# Patient Record
Sex: Female | Born: 1965
Health system: Southern US, Community
[De-identification: ages and names within clinical notes are randomized; demographics above are authoritative.]

## PROBLEM LIST (undated history)

## (undated) DIAGNOSIS — C801 Malignant (primary) neoplasm, unspecified: Secondary | ICD-10-CM

## (undated) DIAGNOSIS — K5909 Other constipation: Secondary | ICD-10-CM

## (undated) DIAGNOSIS — T7840XA Allergy, unspecified, initial encounter: Secondary | ICD-10-CM

## (undated) DIAGNOSIS — R011 Cardiac murmur, unspecified: Secondary | ICD-10-CM

## (undated) DIAGNOSIS — D649 Anemia, unspecified: Secondary | ICD-10-CM

## (undated) DIAGNOSIS — I10 Essential (primary) hypertension: Secondary | ICD-10-CM

## (undated) DIAGNOSIS — F32A Depression, unspecified: Secondary | ICD-10-CM

## (undated) DIAGNOSIS — K219 Gastro-esophageal reflux disease without esophagitis: Secondary | ICD-10-CM

## (undated) HISTORY — PX: COLONOSCOPY: SHX174

## (undated) HISTORY — PX: LYMPH NODE BIOPSY: SHX201

## (undated) HISTORY — PX: CHOLECYSTECTOMY: SHX55

## (undated) HISTORY — DX: Anemia, unspecified: D64.9

## (undated) HISTORY — DX: Depression, unspecified: F32.A

## (undated) HISTORY — DX: Malignant (primary) neoplasm, unspecified: C80.1

## (undated) HISTORY — DX: Cardiac murmur, unspecified: R01.1

## (undated) HISTORY — DX: Allergy, unspecified, initial encounter: T78.40XA

## (undated) HISTORY — DX: Gastro-esophageal reflux disease without esophagitis: K21.9

## (undated) HISTORY — DX: Essential (primary) hypertension: I10

## (undated) HISTORY — PX: NASAL SINUS SURGERY: SHX719

## (undated) HISTORY — DX: Other constipation: K59.09

---

## 1995-06-06 HISTORY — PX: ECTOPIC PREGNANCY SURGERY: SHX613

## 2004-08-25 ENCOUNTER — Ambulatory Visit: Payer: Self-pay | Admitting: Otolaryngology

## 2004-09-01 ENCOUNTER — Ambulatory Visit: Payer: Self-pay | Admitting: Otolaryngology

## 2005-12-11 ENCOUNTER — Ambulatory Visit: Payer: Self-pay | Admitting: Otolaryngology

## 2005-12-14 ENCOUNTER — Ambulatory Visit: Payer: Self-pay | Admitting: Otolaryngology

## 2008-02-25 ENCOUNTER — Emergency Department: Payer: Self-pay | Admitting: Emergency Medicine

## 2008-02-25 ENCOUNTER — Other Ambulatory Visit: Payer: Self-pay

## 2010-08-16 ENCOUNTER — Ambulatory Visit: Payer: Self-pay | Admitting: Internal Medicine

## 2010-12-30 ENCOUNTER — Ambulatory Visit (INDEPENDENT_AMBULATORY_CARE_PROVIDER_SITE_OTHER): Payer: 59 | Admitting: Cardiovascular Disease

## 2010-12-30 ENCOUNTER — Encounter: Payer: Self-pay | Admitting: Cardiovascular Disease

## 2010-12-30 DIAGNOSIS — R079 Chest pain, unspecified: Secondary | ICD-10-CM

## 2010-12-30 DIAGNOSIS — K219 Gastro-esophageal reflux disease without esophagitis: Secondary | ICD-10-CM

## 2010-12-30 DIAGNOSIS — F419 Anxiety disorder, unspecified: Secondary | ICD-10-CM | POA: Insufficient documentation

## 2010-12-30 DIAGNOSIS — F411 Generalized anxiety disorder: Secondary | ICD-10-CM | POA: Insufficient documentation

## 2010-12-30 HISTORY — DX: Anxiety disorder, unspecified: F41.9

## 2010-12-30 NOTE — Progress Notes (Signed)
Exercise Treadmill Test  Treadmill ordered for recent epsiodes of chest pain.  Resting EKG shows NSR with rate of 64 bpm, no significant ST or T wave changes Resting blood pressure of 118/72 Stand bruce protocal was used.  Patient exercised for 8 min 01 sec,  Peak heart rate of 167 bpm.  This was 97% of the maximum predicted heart rate (target heart rate 147). No symptoms of chest pain or lightheadedness were reported at peak stress or in recovery.  Peak Blood pressure recorded was 180/92 Heart rate at 3 minutes in recovery was 117 bpm.  FINAL IMPRESSION: Normal exercise stress test. No significant EKG changes concerning for ischemia. Excellent exercise tolerance.

## 2010-12-30 NOTE — Assessment & Plan Note (Signed)
Recent episodes of chest pain over the past year. Symptoms are somewhat atypical. No significant family history or EKG changes. She is concerned and we will have her complete a treadmill stress test to reassure her.

## 2010-12-30 NOTE — Assessment & Plan Note (Signed)
She does have significant stress at home. Her symptoms of chest pain or real and my suspicion is that they are not secondary to stress and possibly secondary to GI etiology.

## 2010-12-30 NOTE — Progress Notes (Signed)
Patient ID: Dawn Griffith, female    DOB: April 25, 1966, 45 y.o.   MRN: [id number]  HPI Comments: Dawn Griffith is a very pleasant 45 year old woman, patient of Dr. Darrick Huntsman, with a history of GERD, with recent episodes of chest pain over the past year. She presents for evaluation of these symptoms.   She reports that over the past year, she has had probably up to 20 episodes of sharp pain in the center of her chest, around the xiphoid area upwards. It comes on for one to one and a half hours at a time. He can happen typically at rest while she was watching TV or at work sitting. She has not noted any precipitating factors. No activities or medications seem to alleviate her discomfort. She does report though that when she was on a PPI, her symptoms were better. She took herself off the medication in February of this year as she did not feel was helping.  She had any episode of discomfort this week on Tuesday with sweating, pallor with the pain. It resolved after some time. Again was not associated with exertion. She is typically very active. She has had significant stress at home.  EKG shows normal sinus rhythm with rate 64 beats per minute,No significant ST or T wave changes   Outpatient Encounter Prescriptions as of 12/30/2010  Medication Sig Dispense Refill  . ALPRAZolam (XANAX) 0.5 MG tablet Take 0.5 mg by mouth at bedtime as needed.        Marland Kitchen dexlansoprazole (DEXILANT) 60 MG capsule Take 60 mg by mouth daily.        . furosemide (LASIX) 20 MG tablet Take 20 mg by mouth daily.        . hyoscyamine (LEVBID) 0.375 MG 12 hr tablet Take 0.375 mg by mouth every 12 (twelve) hours as needed.        Marland Kitchen losartan (COZAAR) 100 MG tablet Take 100 mg by mouth daily.        . Norgestrel-Ethinyl Estradiol (LOW-OGESTREL PO) Take by mouth.        . zolpidem (AMBIEN CR) 12.5 MG CR tablet Take 12.5 mg by mouth at bedtime as needed.           Review of Systems  Constitutional: Negative.   HENT: Negative.   Eyes:  Negative.   Respiratory: Negative.   Cardiovascular: Positive for chest pain.  Gastrointestinal: Negative.   Musculoskeletal: Negative.   Skin: Negative.   Neurological: Negative.   Hematological: Negative.   Psychiatric/Behavioral: Negative.   All other systems reviewed and are negative.    BP 135/81  Pulse 63  Ht 5\' 9"  (1.753 m)  Wt 173 lb (78.472 kg)  BMI 25.55 kg/m2  Physical Exam  Nursing note and vitals reviewed. Constitutional: She is oriented to person, place, and time. She appears well-developed and well-nourished.  HENT:  Head: Normocephalic.  Nose: Nose normal.  Mouth/Throat: Oropharynx is clear and moist.  Eyes: Conjunctivae are normal. Pupils are equal, round, and reactive to light.  Neck: Normal range of motion. Neck supple. No JVD present.  Cardiovascular: Normal rate, regular rhythm, S1 normal, S2 normal, normal heart sounds and intact distal pulses.  Exam reveals no gallop and no friction rub.   No murmur heard. Pulmonary/Chest: Effort normal and breath sounds normal. No respiratory distress. She has no wheezes. She has no rales. She exhibits no tenderness.  Abdominal: Soft. Bowel sounds are normal. She exhibits no distension. There is no tenderness.  Musculoskeletal: Normal range  of motion. She exhibits no edema and no tenderness.  Lymphadenopathy:    She has no cervical adenopathy.  Neurological: She is alert and oriented to person, place, and time. Coordination normal.  Skin: Skin is warm and dry. No rash noted. No erythema.  Psychiatric: She has a normal mood and affect. Her behavior is normal. Judgment and thought content normal.         Assessment and Plan

## 2010-12-30 NOTE — Assessment & Plan Note (Signed)
We have suggested that she go back on her proton pump inhibitor. If her symptoms persist, and if her treadmill is normal, she could consider workup with gastroenterology.

## 2010-12-30 NOTE — Patient Instructions (Signed)
You are doing well. No medication changes were made. We will perform a treadmill study today. Please call us if you have new issues that need to be addressed

## 2011-02-13 ENCOUNTER — Other Ambulatory Visit (INDEPENDENT_AMBULATORY_CARE_PROVIDER_SITE_OTHER): Payer: BC Managed Care – PPO | Admitting: *Deleted

## 2011-02-13 ENCOUNTER — Telehealth: Payer: Self-pay | Admitting: *Deleted

## 2011-02-13 DIAGNOSIS — R1013 Epigastric pain: Secondary | ICD-10-CM | POA: Insufficient documentation

## 2011-02-13 DIAGNOSIS — Z0000 Encounter for general adult medical examination without abnormal findings: Secondary | ICD-10-CM

## 2011-02-13 NOTE — Telephone Encounter (Signed)
Patient notified

## 2011-02-13 NOTE — Telephone Encounter (Signed)
Order faxed to [facility] for ultrasound. Also lab appt scheduled for patient to come in for cmet.

## 2011-02-13 NOTE — Telephone Encounter (Signed)
i do not havean appt today but I would like to get her an abdominal ultrasound and a CMET at [facility] today and have her keep appt for tomorro.  This is to rule out gallbladder disease.Dawn Griffith

## 2011-02-13 NOTE — Telephone Encounter (Signed)
Patient says that yesterday she was having pain that in her chest that wrapped all the way around to her back. She says that it lasted a good while and was so painful that she almost went to the ER. She says that she has seen you at [facility] for this same problem several times and has been following all of your instructions. She has an appt scheduled for tomorrow, but is asking if she can be seen today. Please advise.

## 2011-02-14 ENCOUNTER — Ambulatory Visit: Payer: Self-pay | Admitting: Internal Medicine

## 2011-02-14 ENCOUNTER — Encounter: Payer: Self-pay | Admitting: Internal Medicine

## 2011-02-14 ENCOUNTER — Ambulatory Visit (INDEPENDENT_AMBULATORY_CARE_PROVIDER_SITE_OTHER): Payer: BC Managed Care – PPO | Admitting: Internal Medicine

## 2011-02-14 DIAGNOSIS — K8020 Calculus of gallbladder without cholecystitis without obstruction: Secondary | ICD-10-CM

## 2011-02-14 DIAGNOSIS — K8051 Calculus of bile duct without cholangitis or cholecystitis with obstruction: Secondary | ICD-10-CM

## 2011-02-14 LAB — COMPREHENSIVE METABOLIC PANEL
BUN: 7 mg/dL (ref 6–23)
CO2: 22 mEq/L (ref 19–32)
Calcium: 9.2 mg/dL (ref 8.4–10.5)
Chloride: 108 mEq/L (ref 96–112)
Creatinine, Ser: 0.6 mg/dL (ref 0.4–1.2)
GFR: 111.63 mL/min (ref >60.00)
Glucose, Bld: 98 mg/dL (ref 70–99)
Total Bilirubin: 3.9 mg/dL — ABNORMAL HIGH (ref 0.3–1.2)

## 2011-02-14 MED ORDER — OXYCODONE-ACETAMINOPHEN 5-325 MG PO TABS: 1.0000 | ORAL_TABLET | Freq: Four times a day (QID) | ORAL | Status: DC | PRN

## 2011-02-14 MED ORDER — ALPRAZOLAM 0.5 MG PO TABS: 0.5000 mg | ORAL_TABLET | Freq: Every evening | ORAL | Status: DC | PRN

## 2011-02-14 MED ORDER — AMITRIPTYLINE HCL 100 MG PO TABS: 100.0000 mg | ORAL_TABLET | Freq: Every day | ORAL | Status: DC

## 2011-02-14 MED ORDER — ZOLPIDEM TARTRATE ER 12.5 MG PO TBCR: 12.5000 mg | EXTENDED_RELEASE_TABLET | Freq: Every evening | ORAL | Status: DC | PRN

## 2011-02-14 NOTE — Patient Instructions (Signed)
You need to have your gallbladder out.  If you have another attack ,  Go to the [facility] ER.

## 2011-02-15 ENCOUNTER — Telehealth: Payer: Self-pay | Admitting: Internal Medicine

## 2011-02-15 DIAGNOSIS — K8051 Calculus of bile duct without cholangitis or cholecystitis with obstruction: Secondary | ICD-10-CM | POA: Insufficient documentation

## 2011-02-15 NOTE — Telephone Encounter (Signed)
I also told her that I had left 2 messages on Dr. Brett Canales Wilson's machine.  She is the one that gave me the other two doctors numbers to call./slj

## 2011-02-15 NOTE — Progress Notes (Signed)
  Subjective:    Patient ID: Dawn Griffith, female    DOB: [birth year], 45 y.o.   MRN: [id number]  HPI  45 yr old white female with history of generalzied anxiety and GERD presents with hsitory of recurrent substernal chest pain , last episode over the weekend lasting approximately 12 hours.  Occurred after eating,  accompaneid by nausea.  No vomiting, fevers, or change in bowel habits .  Urine has been dark yellow.  Has had prior cardiology evaluation with negative stress test done this summer. Prior to today's visit was sent for abdominal ultrasound and CMET which noted gallstones,  GB wall thickeing, and cholestatic labs with elevated T Bili, alk phos and AST/ALT    Review of Systems  Constitutional: Negative for fever, chills and unexpected weight change.  HENT: Negative for hearing loss, ear pain, nosebleeds, congestion, sore throat, facial swelling, rhinorrhea, sneezing, mouth sores, trouble swallowing, neck pain, neck stiffness, voice change, postnasal drip, sinus pressure, tinnitus and ear discharge.   Eyes: Negative for pain, discharge, redness and visual disturbance.  Respiratory: Negative for cough, chest tightness, shortness of breath, wheezing and stridor.   Cardiovascular: Negative for chest pain, palpitations and leg swelling.  Gastrointestinal: Positive for abdominal pain.  Musculoskeletal: Negative for myalgias and arthralgias.  Skin: Negative for color change and rash.  Neurological: Negative for dizziness, weakness, light-headedness and headaches.  Hematological: Negative for adenopathy.       Objective:   Physical Exam  Constitutional: She is oriented to person, place, and time. She appears well-developed and well-nourished.  HENT:  Mouth/Throat: Oropharynx is clear and moist.  Eyes: EOM are normal. Pupils are equal, round, and reactive to light. No scleral icterus.  Neck: Normal range of motion. Neck supple. No JVD present. No thyromegaly present.  Cardiovascular:  Normal rate, regular rhythm, normal heart sounds and intact distal pulses.   Pulmonary/Chest: Effort normal and breath sounds normal.  Abdominal: Soft. Bowel sounds are normal. She exhibits no mass. There is tenderness. There is no rebound and no guarding.  Musculoskeletal: Normal range of motion. She exhibits no edema.  Lymphadenopathy:    She has no cervical adenopathy.  Neurological: She is alert and oriented to person, place, and time.  Skin: Skin is warm and dry.  Psychiatric: She has a normal mood and affect.          Assessment & Plan:

## 2011-02-15 NOTE — Assessment & Plan Note (Signed)
With jaundice by labs (physical exam relatively normal) and gallstones and GB wall thickening.  She needs urgen asurgical consult but not emergent as she is currently having no pain or fevers.  She is requesting referral to [facility] for surgical consult as her gastroenterologist is there.  We will call his office the morning to arrange.  Instructed to go to [facility] ER if she develops another episode of pain .

## 2011-02-16 NOTE — Telephone Encounter (Signed)
I have spoken to [facility] and also to Dr. Evette Cristal.  His office is going to call her today to get her in ASAP.  Please fax her office ntoe and labs to his office.

## 2011-02-17 ENCOUNTER — Ambulatory Visit: Payer: 59 | Admitting: Internal Medicine

## 2011-02-17 ENCOUNTER — Other Ambulatory Visit: Payer: Self-pay | Admitting: General Surgery

## 2011-02-21 ENCOUNTER — Ambulatory Visit: Payer: Self-pay | Admitting: General Surgery

## 2011-02-23 LAB — PATHOLOGY REPORT

## 2011-02-28 ENCOUNTER — Telehealth: Payer: Self-pay | Admitting: Internal Medicine

## 2011-02-28 NOTE — Telephone Encounter (Signed)
She was very thankful and very pleased with the doctor we recommended to her in [place].  She did say that she had the surgery on the 18th of Sept. And she goes back and sees him on Sept. 28th.

## 2011-03-09 ENCOUNTER — Encounter: Payer: Self-pay | Admitting: Internal Medicine

## 2011-03-10 ENCOUNTER — Ambulatory Visit (INDEPENDENT_AMBULATORY_CARE_PROVIDER_SITE_OTHER): Payer: BC Managed Care – PPO | Admitting: Internal Medicine

## 2011-03-10 DIAGNOSIS — Z23 Encounter for immunization: Secondary | ICD-10-CM

## 2011-03-31 ENCOUNTER — Encounter: Payer: Self-pay | Admitting: Internal Medicine

## 2011-03-31 ENCOUNTER — Ambulatory Visit (INDEPENDENT_AMBULATORY_CARE_PROVIDER_SITE_OTHER): Payer: BC Managed Care – PPO | Admitting: Internal Medicine

## 2011-03-31 DIAGNOSIS — Z79899 Other long term (current) drug therapy: Secondary | ICD-10-CM

## 2011-03-31 DIAGNOSIS — F419 Anxiety disorder, unspecified: Secondary | ICD-10-CM

## 2011-03-31 DIAGNOSIS — Z13220 Encounter for screening for lipoid disorders: Secondary | ICD-10-CM

## 2011-03-31 DIAGNOSIS — F411 Generalized anxiety disorder: Secondary | ICD-10-CM

## 2011-03-31 DIAGNOSIS — K8051 Calculus of bile duct without cholangitis or cholecystitis with obstruction: Secondary | ICD-10-CM

## 2011-03-31 DIAGNOSIS — K219 Gastro-esophageal reflux disease without esophagitis: Secondary | ICD-10-CM

## 2011-03-31 NOTE — Progress Notes (Signed)
  Subjective:    Patient ID: Dawn Griffith, female    DOB: [birth year], 45 y.o.   MRN: [id number]  HPI  45 yo white female with history of generalized anxiety/depressive disorder recently diagnosed by choledochlithiasis returns for hospital followup afterundergong successful  lap chole with stone removal by Evette Cristal on Sept 18th.   She feels  better than she has in a long time.  No abdominal pain or diarrhea.  She is sleeping much better with pm use of amitriptyline.  Past Medical History  Diagnosis Date  . Cancer     as a child  . Hypertension    Current Outpatient Prescriptions on File Prior to Visit  Medication Sig Dispense Refill  . ALPRAZolam (XANAX) 0.5 MG tablet Take 1 tablet (0.5 mg total) by mouth at bedtime as needed.  30 tablet  3  . amitriptyline (ELAVIL) 100 MG tablet Take 1 tablet (100 mg total) by mouth at bedtime.  30 tablet  5  . cetirizine (ZYRTEC) 10 MG tablet Take 10 mg by mouth daily.        Marland Kitchen losartan (COZAAR) 100 MG tablet Take 100 mg by mouth daily.        . norgestrel-ethinyl estradiol (LO/OVRAL,CRYSELLE) 0.3-30 MG-MCG tablet Take 1 tablet by mouth daily.        Marland Kitchen omeprazole (PRILOSEC) 40 MG capsule Take 40 mg by mouth daily.        Marland Kitchen zolpidem (AMBIEN CR) 12.5 MG CR tablet Take 1 tablet (12.5 mg total) by mouth at bedtime as needed.  30 tablet  3    Review of Systems  Constitutional: Negative for fever, chills and unexpected weight change.  HENT: Negative for hearing loss, ear pain, nosebleeds, congestion, sore throat, facial swelling, rhinorrhea, sneezing, mouth sores, trouble swallowing, neck pain, neck stiffness, voice change, postnasal drip, sinus pressure, tinnitus and ear discharge.   Eyes: Negative for pain, discharge, redness and visual disturbance.  Respiratory: Negative for cough, chest tightness, shortness of breath, wheezing and stridor.   Cardiovascular: Negative for chest pain, palpitations and leg swelling.  Musculoskeletal: Negative for myalgias and  arthralgias.  Skin: Negative for color change and rash.  Neurological: Negative for dizziness, weakness, light-headedness and headaches.  Hematological: Negative for adenopathy.       Objective:   Physical Exam  Constitutional: She is oriented to person, place, and time. She appears well-developed and well-nourished.  HENT:  Mouth/Throat: Oropharynx is clear and moist.  Eyes: EOM are normal. Pupils are equal, round, and reactive to light. No scleral icterus.  Neck: Normal range of motion. Neck supple. No JVD present. No thyromegaly present.  Cardiovascular: Normal rate, regular rhythm, normal heart sounds and intact distal pulses.   Pulmonary/Chest: Effort normal and breath sounds normal.  Abdominal: Soft. Bowel sounds are normal. She exhibits no mass. There is no tenderness.  Musculoskeletal: Normal range of motion. She exhibits no edema.  Lymphadenopathy:    She has no cervical adenopathy.  Neurological: She is alert and oriented to person, place, and time.  Skin: Skin is warm and dry.  Psychiatric: She has a normal mood and affect.          Assessment & Plan:

## 2011-04-02 ENCOUNTER — Encounter: Payer: Self-pay | Admitting: Internal Medicine

## 2011-04-02 NOTE — Assessment & Plan Note (Signed)
Controlled with current medications.  No changes today

## 2011-04-02 NOTE — Assessment & Plan Note (Signed)
Controlled with PPI

## 2011-04-02 NOTE — Assessment & Plan Note (Signed)
S/p lap chole with stone retrieval by Dr. Evette Cristal.  No complications.

## 2011-04-05 ENCOUNTER — Other Ambulatory Visit: Payer: BC Managed Care – PPO

## 2011-04-06 ENCOUNTER — Other Ambulatory Visit (INDEPENDENT_AMBULATORY_CARE_PROVIDER_SITE_OTHER): Payer: BC Managed Care – PPO | Admitting: *Deleted

## 2011-04-06 DIAGNOSIS — Z79899 Other long term (current) drug therapy: Secondary | ICD-10-CM

## 2011-04-06 DIAGNOSIS — R1013 Epigastric pain: Secondary | ICD-10-CM

## 2011-04-06 DIAGNOSIS — Z13220 Encounter for screening for lipoid disorders: Secondary | ICD-10-CM

## 2011-04-06 LAB — COMPREHENSIVE METABOLIC PANEL
ALT: 37 U/L — ABNORMAL HIGH (ref 0–35)
Alkaline Phosphatase: 49 U/L (ref 39–117)
CO2: 23 mEq/L (ref 19–32)
GFR: 101.87 mL/min (ref >60.00)
Potassium: 4.2 mEq/L (ref 3.5–5.1)
Sodium: 140 mEq/L (ref 135–145)
Total Bilirubin: 0.6 mg/dL (ref 0.3–1.2)
Total Protein: 7.1 g/dL (ref 6.0–8.3)

## 2011-04-06 LAB — LIPID PANEL
Total CHOL/HDL Ratio: 5
Triglycerides: 88 mg/dL (ref 0.0–149.0)

## 2011-04-07 ENCOUNTER — Encounter: Payer: Self-pay | Admitting: Internal Medicine

## 2011-04-08 ENCOUNTER — Other Ambulatory Visit: Payer: Self-pay | Admitting: Internal Medicine

## 2011-05-04 ENCOUNTER — Other Ambulatory Visit: Payer: Self-pay | Admitting: Internal Medicine

## 2011-05-04 MED ORDER — OMEPRAZOLE 40 MG PO CPDR: 40.0000 mg | DELAYED_RELEASE_CAPSULE | Freq: Every day | ORAL | Status: DC

## 2011-05-05 ENCOUNTER — Ambulatory Visit (INDEPENDENT_AMBULATORY_CARE_PROVIDER_SITE_OTHER): Payer: BC Managed Care – PPO | Admitting: Internal Medicine

## 2011-05-05 ENCOUNTER — Encounter: Payer: Self-pay | Admitting: Internal Medicine

## 2011-05-05 DIAGNOSIS — K5900 Constipation, unspecified: Secondary | ICD-10-CM

## 2011-05-05 LAB — COMPREHENSIVE METABOLIC PANEL
Albumin: 4.3 g/dL (ref 3.5–5.2)
BUN: 11 mg/dL (ref 6–23)
Calcium: 9.3 mg/dL (ref 8.4–10.5)
Chloride: 105 mEq/L (ref 96–112)
GFR: 71.19 mL/min (ref >60.00)
Glucose, Bld: 96 mg/dL (ref 70–99)
Potassium: 4.4 mEq/L (ref 3.5–5.1)

## 2011-05-05 LAB — MAGNESIUM: Magnesium: 2 mg/dL (ref 1.5–2.5)

## 2011-05-05 NOTE — Progress Notes (Signed)
  Subjective:    Patient ID: Dawn Griffith, female    DOB: 02/17/1966, 45 y.o.   MRN: [id number]  HPI  45 you white feamle with histoyr of depression and recnet cholecystitis/choledocholithiasis s/p laparascopic cholecystectomy with successful stone extraction presents with new onset constipation which has been occurring for the past  6 weeks, since her gallbladder surgery.  She drinks an adequate amout of water and has been taking a fiber supplement daily for the past two weeks with only 3 stools during that time.  She was using miralwx more successfully but it required daily use.  She denies use of any narcotics or medication changes. No abdominal pian, hemorrhoids, or rectal pain/bleeding.   Past Medical History  Diagnosis Date  . Cancer     as a child  . Hypertension    Current Outpatient Prescriptions on File Prior to Visit  Medication Sig Dispense Refill  . ALPRAZolam (XANAX) 0.5 MG tablet Take 1 tablet (0.5 mg total) by mouth at bedtime as needed.  30 tablet  3  . amitriptyline (ELAVIL) 100 MG tablet Take 1 tablet (100 mg total) by mouth at bedtime.  30 tablet  5  . cetirizine (ZYRTEC) 10 MG tablet Take 10 mg by mouth daily.        Marland Kitchen losartan (COZAAR) 100 MG tablet TAKE 1 TABLET BY MOUTH EVERY DAY  30 tablet  3  . norgestrel-ethinyl estradiol (LO/OVRAL,CRYSELLE) 0.3-30 MG-MCG tablet Take 1 tablet by mouth daily.        Marland Kitchen omeprazole (PRILOSEC) 40 MG capsule Take 1 capsule (40 mg total) by mouth daily.  30 capsule  5  . zolpidem (AMBIEN CR) 12.5 MG CR tablet Take 1 tablet (12.5 mg total) by mouth at bedtime as needed.  30 tablet  3     Review of Systems  Constitutional: Negative for fever, chills and unexpected weight change.  HENT: Negative for hearing loss, ear pain, nosebleeds, congestion, sore throat, facial swelling, rhinorrhea, sneezing, mouth sores, trouble swallowing, neck pain, neck stiffness, voice change, postnasal drip, sinus pressure, tinnitus and ear discharge.   Eyes:  Negative for pain, discharge, redness and visual disturbance.  Respiratory: Negative for cough, chest tightness, shortness of breath, wheezing and stridor.   Cardiovascular: Negative for chest pain, palpitations and leg swelling.  Gastrointestinal: Positive for constipation.  Musculoskeletal: Negative for myalgias and arthralgias.  Skin: Negative for color change and rash.  Neurological: Negative for dizziness, weakness, light-headedness and headaches.  Hematological: Negative for adenopathy.       Objective:   Physical Exam  Constitutional: She is oriented to person, place, and time. She appears well-developed and well-nourished.  HENT:  Mouth/Throat: Oropharynx is clear and moist.  Eyes: EOM are normal. Pupils are equal, round, and reactive to light. No scleral icterus.  Neck: Normal range of motion. Neck supple. No JVD present. No thyromegaly present.  Cardiovascular: Normal rate, regular rhythm, normal heart sounds and intact distal pulses.   Pulmonary/Chest: Effort normal and breath sounds normal.  Abdominal: Soft. Bowel sounds are normal. She exhibits no mass. There is no tenderness.  Musculoskeletal: Normal range of motion. She exhibits no edema.  Lymphadenopathy:    She has no cervical adenopathy.  Neurological: She is alert and oriented to person, place, and time.  Skin: Skin is warm and dry.  Psychiatric: She has a normal mood and affect.          Assessment & Plan:

## 2011-05-06 HISTORY — PX: CHOLECYSTECTOMY: SHX55

## 2011-05-06 LAB — HM COLONOSCOPY: HM Colonoscopy: NORMAL

## 2011-05-07 DIAGNOSIS — K5900 Constipation, unspecified: Secondary | ICD-10-CM | POA: Insufficient documentation

## 2011-05-07 DIAGNOSIS — K5909 Other constipation: Secondary | ICD-10-CM | POA: Insufficient documentation

## 2011-05-07 MED ORDER — LUBIPROSTONE 24 MCG PO CAPS: 24.0000 ug | ORAL_CAPSULE | Freq: Two times a day (BID) | ORAL | Status: DC

## 2011-05-07 MED ORDER — LACTULOSE 20 GM/30ML PO SOLN: 30.0000 mL | ORAL | Status: DC | PRN

## 2011-05-07 NOTE — Patient Instructions (Signed)
Please use miralax on  A daily basis to prevent constipation  We are checking your electrolytes and thyroid function  If the miralax is not helpful, you may begin the amitiza one tablet twice daily.

## 2011-05-07 NOTE — Assessment & Plan Note (Signed)
New onset, since her cholecystectomy.  Will check thyroid functin and electrolytes and recommend daily use of miralax.  Samples of amitiza 24 mcg given

## 2011-05-08 ENCOUNTER — Telehealth: Payer: Self-pay | Admitting: Internal Medicine

## 2011-05-08 NOTE — Telephone Encounter (Signed)
Pt called   [facility] [address] [phone] Pt was here on Friday and dr Darrick Huntsman was going to call rx in for her.  Pt stated she has [facility] and they do not have the rx.  This is a new rx pt doesn't know name of med. Please resend rx

## 2011-05-08 NOTE — Telephone Encounter (Signed)
Rx has been called in.  Patient notified. 

## 2011-05-18 ENCOUNTER — Encounter: Payer: Self-pay | Admitting: Internal Medicine

## 2011-05-18 ENCOUNTER — Ambulatory Visit (INDEPENDENT_AMBULATORY_CARE_PROVIDER_SITE_OTHER): Payer: BC Managed Care – PPO | Admitting: Internal Medicine

## 2011-05-18 DIAGNOSIS — K5900 Constipation, unspecified: Secondary | ICD-10-CM

## 2011-05-18 MED ORDER — LACTULOSE 20 GM/30ML PO SOLN: 30.0000 mL | ORAL | Status: DC | PRN

## 2011-05-18 MED ORDER — LUBIPROSTONE 24 MCG PO CAPS: 24.0000 ug | ORAL_CAPSULE | Freq: Two times a day (BID) | ORAL | Status: DC

## 2011-05-18 NOTE — Patient Instructions (Signed)
Increase the amitiza to two times daily.  Continue daily miralax, save the lactulose  for every 3 days if no BM.

## 2011-05-18 NOTE — Assessment & Plan Note (Addendum)
She has had no improvement with use of daily fiber supplements.  Her mother had a p[resumed diagnosis of FAP.  Patient's last colonoscopy was normal and over  4 years ago.  Since this represents a change in bowel habits I have recommended colonoscopy. Will send back to Dr June Leap for colonoscopy and in the interim increase amitiza to 24 mcg bid.  Samples given

## 2011-05-19 ENCOUNTER — Encounter: Payer: Self-pay | Admitting: Internal Medicine

## 2011-05-19 NOTE — Progress Notes (Signed)
Subjective:    Patient ID: Dawn Griffith, female    DOB: 1966/03/15, 45 y.o.   MRN: [id number]  HPI  Dawn Griffith retursn for two week followup on neew complaint of constipation.. She had cholecystectomy about 8 weeks ago, uncomplicated.  For the past 8 week she  has noted decreased stooling despite adequate diet, water intake, and prn use of fiber laxatives.  Has not used a narcotic since dc from hospital.   At initial visit electrolytes and thyroid function were normal, and she was prescribed lactulose for relief of constipation,  Daily fiberr supplement and trial of amitiza which she has only been using once daily.     Review of Systems  Constitutional: Negative for fever, chills and unexpected weight change.  HENT: Negative for hearing loss, ear pain, nosebleeds, congestion, sore throat, facial swelling, rhinorrhea, sneezing, mouth sores, trouble swallowing, neck pain, neck stiffness, voice change, postnasal drip, sinus pressure, tinnitus and ear discharge.   Eyes: Negative for pain, discharge, redness and visual disturbance.  Respiratory: Negative for cough, chest tightness, shortness of breath, wheezing and stridor.   Cardiovascular: Negative for chest pain, palpitations and leg swelling.  Gastrointestinal: Positive for constipation.  Musculoskeletal: Negative for myalgias and arthralgias.  Skin: Negative for color change and rash.  Neurological: Negative for dizziness, weakness, light-headedness and headaches.  Hematological: Negative for adenopathy.   BP 126/80  Pulse 97  Temp(Src) 98.2 F (36.8 C) (Oral)  Resp 16  Ht 5\' 9"  (1.753 m)  Wt 174 lb 12 oz (79.266 kg)  BMI 25.81 kg/m2  SpO2 100%  LMP 05/10/2011     Objective:   Physical Exam  Constitutional: She is oriented to person, place, and time. She appears well-developed and well-nourished.  HENT:  Mouth/Throat: Oropharynx is clear and moist.  Eyes: EOM are normal. Pupils are equal, round, and reactive to light. No scleral  icterus.  Neck: Normal range of motion. Neck supple. No JVD present. No thyromegaly present.  Cardiovascular: Normal rate, regular rhythm, normal heart sounds and intact distal pulses.   Pulmonary/Chest: Effort normal and breath sounds normal.  Abdominal: Soft. Bowel sounds are normal. She exhibits no mass. There is no tenderness.  Musculoskeletal: Normal range of motion. She exhibits no edema.  Lymphadenopathy:    She has no cervical adenopathy.  Neurological: She is alert and oriented to person, place, and time.  Skin: Skin is warm and dry.  Psychiatric: She has a normal mood and affect.        Assessment & Plan:     Constipation She has had no improvement with use of daily fiber supplements.  Her mother had a p[resumed diagnosis of FAP.  Patient's last colonoscopy was normal and over  4 years ago.  Since this represents a change in bowel habits I have recommended colonoscopy. Will send back to Dr June Leap for colonoscopy and in the interim increase amitiza to 24 mcg bid.  Samples given     Updated Medication List Outpatient Encounter Prescriptions as of 05/18/2011  Medication Sig Dispense Refill  . ALPRAZolam (XANAX) 0.5 MG tablet Take 1 tablet (0.5 mg total) by mouth at bedtime as needed.  30 tablet  3  . amitriptyline (ELAVIL) 100 MG tablet Take 1 tablet (100 mg total) by mouth at bedtime.  30 tablet  5  . cetirizine (ZYRTEC) 10 MG tablet Take 10 mg by mouth daily.        . Lactulose 20 GM/30ML SOLN Take 30 mLs (20 g total)  by mouth every 4 (four) hours as needed (constipation).  240 mL  1  . losartan (COZAAR) 100 MG tablet TAKE 1 TABLET BY MOUTH EVERY DAY  30 tablet  3  . lubiprostone (AMITIZA) 24 MCG capsule Take 1 capsule (24 mcg total) by mouth 2 (two) times daily with a meal.  60 capsule  0  . norgestrel-ethinyl estradiol (LO/OVRAL,CRYSELLE) 0.3-30 MG-MCG tablet Take 1 tablet by mouth daily.        Marland Kitchen omeprazole (PRILOSEC) 40 MG capsule Take 1 capsule (40 mg total) by  mouth daily.  30 capsule  5  . zolpidem (AMBIEN CR) 12.5 MG CR tablet Take 1 tablet (12.5 mg total) by mouth at bedtime as needed.  30 tablet  3  . DISCONTD: Lactulose 20 GM/30ML SOLN Take 30 mLs (20 g total) by mouth every 4 (four) hours as needed (constipation).  240 mL  1  . DISCONTD: lubiprostone (AMITIZA) 24 MCG capsule Take 1 capsule (24 mcg total) by mouth 2 (two) times daily with a meal.  14 capsule  0

## 2011-06-08 ENCOUNTER — Other Ambulatory Visit (INDEPENDENT_AMBULATORY_CARE_PROVIDER_SITE_OTHER): Payer: BC Managed Care – PPO | Admitting: *Deleted

## 2011-06-08 ENCOUNTER — Other Ambulatory Visit: Payer: Self-pay | Admitting: Internal Medicine

## 2011-06-08 ENCOUNTER — Telehealth: Payer: Self-pay | Admitting: *Deleted

## 2011-06-08 DIAGNOSIS — R7989 Other specified abnormal findings of blood chemistry: Secondary | ICD-10-CM

## 2011-06-08 LAB — HEPATIC FUNCTION PANEL
ALT: 40 U/L — ABNORMAL HIGH (ref 0–35)
AST: 24 U/L (ref 0–37)
Albumin: 3.7 g/dL (ref 3.5–5.2)
Alkaline Phosphatase: 55 U/L (ref 39–117)

## 2011-06-08 MED ORDER — LUBIPROSTONE 24 MCG PO CAPS: 24.0000 ug | ORAL_CAPSULE | Freq: Two times a day (BID) | ORAL | Status: DC

## 2011-06-08 NOTE — Telephone Encounter (Signed)
Yes, I sent it today

## 2011-06-08 NOTE — Telephone Encounter (Signed)
Patient wanted to let you know that the Dawn Griffith has been helping. She has been taking a probiotic and eating a lot of fiber along with it and since starting this she has had a bowel movement everyday. She is asking if she can get a rx for the amitiza sent to her pharmacy .

## 2011-07-10 ENCOUNTER — Other Ambulatory Visit: Payer: Self-pay | Admitting: *Deleted

## 2011-07-10 MED ORDER — LACTULOSE 20 GM/30ML PO SOLN: 30.0000 mL | ORAL | Status: DC | PRN

## 2011-07-10 NOTE — Telephone Encounter (Signed)
Faxed request from [facility] [address], last filled date not given. 

## 2011-07-11 MED ORDER — ZOLPIDEM TARTRATE ER 12.5 MG PO TBCR: 12.5000 mg | EXTENDED_RELEASE_TABLET | Freq: Every evening | ORAL | Status: DC | PRN

## 2011-07-11 NOTE — Telephone Encounter (Signed)
ambien called to pharmacy

## 2011-07-31 ENCOUNTER — Other Ambulatory Visit: Payer: Self-pay | Admitting: *Deleted

## 2011-07-31 ENCOUNTER — Other Ambulatory Visit: Payer: Self-pay | Admitting: Internal Medicine

## 2011-07-31 MED ORDER — AMITRIPTYLINE HCL 100 MG PO TABS: 100.0000 mg | ORAL_TABLET | Freq: Every day | ORAL | Status: DC

## 2011-07-31 MED ORDER — LOSARTAN POTASSIUM 100 MG PO TABS: 100.0000 mg | ORAL_TABLET | Freq: Every day | ORAL | Status: DC

## 2011-08-15 ENCOUNTER — Ambulatory Visit: Payer: BC Managed Care – PPO | Admitting: Internal Medicine

## 2011-08-15 ENCOUNTER — Encounter: Payer: Self-pay | Admitting: Internal Medicine

## 2011-08-15 ENCOUNTER — Ambulatory Visit (INDEPENDENT_AMBULATORY_CARE_PROVIDER_SITE_OTHER): Payer: BC Managed Care – PPO | Admitting: Internal Medicine

## 2011-08-15 VITALS — BP 134/88 | HR 89 | Temp 98.9°F | Resp 18 | Wt 192.2 lb

## 2011-08-15 DIAGNOSIS — R000 Tachycardia, unspecified: Secondary | ICD-10-CM

## 2011-08-15 DIAGNOSIS — F411 Generalized anxiety disorder: Secondary | ICD-10-CM

## 2011-08-15 DIAGNOSIS — F419 Anxiety disorder, unspecified: Secondary | ICD-10-CM

## 2011-08-15 DIAGNOSIS — E010 Iodine-deficiency related diffuse (endemic) goiter: Secondary | ICD-10-CM

## 2011-08-15 DIAGNOSIS — R002 Palpitations: Secondary | ICD-10-CM

## 2011-08-15 DIAGNOSIS — K5900 Constipation, unspecified: Secondary | ICD-10-CM

## 2011-08-15 DIAGNOSIS — E049 Nontoxic goiter, unspecified: Secondary | ICD-10-CM

## 2011-08-15 MED ORDER — ALPRAZOLAM 0.5 MG PO TABS: 1.0000 mg | ORAL_TABLET | Freq: Two times a day (BID) | ORAL | Status: DC | PRN

## 2011-08-15 MED ORDER — PROPRANOLOL HCL 20 MG PO TABS: 20.0000 mg | ORAL_TABLET | ORAL | Status: AC | PRN

## 2011-08-15 NOTE — Assessment & Plan Note (Signed)
She is having increased insomnia secondary to husband's diagnosis and is requesting increased dose of alprazolam which she has used before.

## 2011-08-15 NOTE — Assessment & Plan Note (Signed)
Improved with use of Amitiza and [facility].

## 2011-08-15 NOTE — Assessment & Plan Note (Signed)
Resolved spontaneously,  In the setting of caffeine excess.  Trial of propranolol if she has another episode.

## 2011-08-15 NOTE — Patient Instructions (Signed)
We are ultrasounding your thyroid    I am prescribing propranolol to take for future episodes of palpitations

## 2011-08-15 NOTE — Progress Notes (Signed)
Subjective:    Patient ID: Dawn Griffith, female    DOB: 05/23/1966, 46 y.o.   MRN: [id number]  HPI presents with history of a 2 hr episode of tachycardia which occurred 4 days ago while using the Internet to check out cruises.  Occurred in the setting of lack of sleep and overeating  On a coca cola cake (very high chocolate content)  .   No history of panic attacks or feelings of doom.  Does note increased stressor at home  since husband has been diagnosed with prostate CA and has been drinking beer to excess.   Past Medical History  Diagnosis Date  . Cancer     as a child  . Hypertension    Current Outpatient Prescriptions on File Prior to Visit  Medication Sig Dispense Refill  . amitriptyline (ELAVIL) 100 MG tablet Take 1 tablet (100 mg total) by mouth at bedtime.  30 tablet  5  . cetirizine (ZYRTEC) 10 MG tablet Take 10 mg by mouth daily.        . Lactulose 20 GM/30ML SOLN Take 30 mLs (20 g total) by mouth every 4 (four) hours as needed (constipation).  240 mL  1  . losartan (COZAAR) 100 MG tablet Take 1 tablet (100 mg total) by mouth daily.  30 tablet  3  . norgestrel-ethinyl estradiol (LO/OVRAL,CRYSELLE) 0.3-30 MG-MCG tablet Take 1 tablet by mouth daily.        Marland Kitchen omeprazole (PRILOSEC) 40 MG capsule Take 1 capsule (40 mg total) by mouth daily.  30 capsule  5  . zolpidem (AMBIEN CR) 12.5 MG CR tablet Take 1 tablet (12.5 mg total) by mouth at bedtime as needed.  30 tablet  4    Review of Systems  Constitutional: Negative for fever, chills and unexpected weight change.  HENT: Negative for hearing loss, ear pain, nosebleeds, congestion, sore throat, facial swelling, rhinorrhea, sneezing, mouth sores, trouble swallowing, neck pain, neck stiffness, voice change, postnasal drip, sinus pressure, tinnitus and ear discharge.   Eyes: Negative for pain, discharge, redness and visual disturbance.  Respiratory: Negative for cough, chest tightness, shortness of breath, wheezing and stridor.     Cardiovascular: Negative for chest pain, palpitations and leg swelling.  Musculoskeletal: Negative for myalgias and arthralgias.  Skin: Negative for color change and rash.  Neurological: Negative for dizziness, weakness, light-headedness and headaches.  Hematological: Negative for adenopathy.   BP 134/88  Pulse 89  Temp(Src) 98.9 F (37.2 C) (Oral)  Resp 18  Wt 192 lb 4 oz (87.204 kg)  SpO2 100%  LMP 07/31/2011     Objective:   Physical Exam  Constitutional: She is oriented to person, place, and time. She appears well-developed and well-nourished.  HENT:  Mouth/Throat: Oropharynx is clear and moist.  Eyes: EOM are normal. Pupils are equal, round, and reactive to light. No scleral icterus.  Neck: Normal range of motion. Neck supple. No JVD present. No thyromegaly present.  Cardiovascular: Normal rate, regular rhythm, normal heart sounds and intact distal pulses.   Pulmonary/Chest: Effort normal and breath sounds normal.  Abdominal: Soft. Bowel sounds are normal. She exhibits no mass. There is no tenderness.  Musculoskeletal: Normal range of motion. She exhibits no edema.  Lymphadenopathy:    She has no cervical adenopathy.  Neurological: She is alert and oriented to person, place, and time.  Skin: Skin is warm and dry.  Psychiatric: She has a normal mood and affect.      Assessment & Plan:  Anxiety She is having increased insomnia secondary to husband's diagnosis and is requesting increased dose of alprazolam which she has used before.   Constipation Improved with use of Amitiza and [facility].   Palpitations Resolved spontaneously,  In the setting of caffeine excess.  Trial of propranolol if she has another episode.     Updated Medication List Outpatient Encounter Prescriptions as of 08/15/2011  Medication Sig Dispense Refill  . ALPRAZolam (XANAX) 0.5 MG tablet Take 2 tablets (1 mg total) by mouth 2 (two) times daily as needed for sleep or anxiety.  60  tablet  3  . amitriptyline (ELAVIL) 100 MG tablet Take 1 tablet (100 mg total) by mouth at bedtime.  30 tablet  5  . cetirizine (ZYRTEC) 10 MG tablet Take 10 mg by mouth daily.        . Lactulose 20 GM/30ML SOLN Take 30 mLs (20 g total) by mouth every 4 (four) hours as needed (constipation).  240 mL  1  . losartan (COZAAR) 100 MG tablet Take 1 tablet (100 mg total) by mouth daily.  30 tablet  3  . norgestrel-ethinyl estradiol (LO/OVRAL,CRYSELLE) 0.3-30 MG-MCG tablet Take 1 tablet by mouth daily.        Marland Kitchen omeprazole (PRILOSEC) 40 MG capsule Take 1 capsule (40 mg total) by mouth daily.  30 capsule  5  . zolpidem (AMBIEN CR) 12.5 MG CR tablet Take 1 tablet (12.5 mg total) by mouth at bedtime as needed.  30 tablet  4  . DISCONTD: ALPRAZolam (XANAX) 0.5 MG tablet Take 1 tablet (0.5 mg total) by mouth at bedtime as needed.  30 tablet  3  . propranolol (INDERAL) 20 MG tablet Take 1 tablet (20 mg total) by mouth as needed. For rapid heart rate  30 tablet  0

## 2011-08-21 ENCOUNTER — Ambulatory Visit: Payer: Self-pay | Admitting: Internal Medicine

## 2011-08-24 ENCOUNTER — Telehealth: Payer: Self-pay | Admitting: Internal Medicine

## 2011-08-24 NOTE — Telephone Encounter (Signed)
Her thyroid ultrasound showed that although she had no nodules or cysts,  The thyroid was borderline enlarged.  Since her thyroid function nis normal,.  I suggest we repeat the u/s in a a year and repeat thyroid function in 6 months.

## 2011-08-24 NOTE — Telephone Encounter (Signed)
Her thyroid ultrasound did not show any nodules or masses, but the thyroid was borderline enlarged.

## 2011-08-25 NOTE — Telephone Encounter (Signed)
Patient notified of results.

## 2011-09-06 ENCOUNTER — Other Ambulatory Visit (INDEPENDENT_AMBULATORY_CARE_PROVIDER_SITE_OTHER): Payer: BC Managed Care – PPO | Admitting: *Deleted

## 2011-09-06 ENCOUNTER — Encounter: Payer: Self-pay | Admitting: Internal Medicine

## 2011-09-06 DIAGNOSIS — Z79899 Other long term (current) drug therapy: Secondary | ICD-10-CM

## 2011-09-06 LAB — HEPATIC FUNCTION PANEL
ALT: 41 U/L — ABNORMAL HIGH (ref 0–35)
Alkaline Phosphatase: 51 U/L (ref 39–117)
Bilirubin, Direct: 0 mg/dL (ref 0.0–0.3)
Total Bilirubin: 0.4 mg/dL (ref 0.3–1.2)
Total Protein: 7.1 g/dL (ref 6.0–8.3)

## 2011-09-12 ENCOUNTER — Telehealth: Payer: Self-pay | Admitting: Internal Medicine

## 2011-09-12 NOTE — Telephone Encounter (Signed)
I never saw htem.  The chart says ashley reviewed them on April 3rd, i don't understand what is happening.  They are unchanged from 4 months ago.  Which means that one liver enzyme is still a tiny bit abnormal.  It is nothing to worry about.  She may have a condition called fatty liver, which improves with weight loss and good diet (low carbohydrate diet).  Repeat in 3 months

## 2011-09-12 NOTE — Telephone Encounter (Signed)
Patient wanting her liver enzyme results.

## 2011-09-13 NOTE — Telephone Encounter (Signed)
Patient notified of results.

## 2011-09-29 ENCOUNTER — Ambulatory Visit: Payer: BC Managed Care – PPO | Admitting: Internal Medicine

## 2011-10-09 ENCOUNTER — Other Ambulatory Visit: Payer: Self-pay | Admitting: Internal Medicine

## 2011-10-24 ENCOUNTER — Other Ambulatory Visit: Payer: Self-pay | Admitting: Internal Medicine

## 2011-10-24 MED ORDER — OMEPRAZOLE 40 MG PO CPDR: 40.0000 mg | DELAYED_RELEASE_CAPSULE | Freq: Every day | ORAL | Status: DC

## 2011-10-31 ENCOUNTER — Telehealth: Payer: Self-pay | Admitting: Internal Medicine

## 2011-10-31 DIAGNOSIS — R5383 Other fatigue: Secondary | ICD-10-CM

## 2011-10-31 DIAGNOSIS — E785 Hyperlipidemia, unspecified: Secondary | ICD-10-CM

## 2011-10-31 NOTE — Telephone Encounter (Signed)
Patient needing CPE labs put in her labs on scheduled for 5.29.13 and her physical is on Friday 5.30.13.

## 2011-10-31 NOTE — Telephone Encounter (Signed)
Entered in epic

## 2011-11-01 ENCOUNTER — Other Ambulatory Visit (INDEPENDENT_AMBULATORY_CARE_PROVIDER_SITE_OTHER): Payer: BC Managed Care – PPO | Admitting: *Deleted

## 2011-11-01 DIAGNOSIS — K5900 Constipation, unspecified: Secondary | ICD-10-CM

## 2011-11-01 DIAGNOSIS — R5381 Other malaise: Secondary | ICD-10-CM

## 2011-11-01 DIAGNOSIS — R7989 Other specified abnormal findings of blood chemistry: Secondary | ICD-10-CM

## 2011-11-01 DIAGNOSIS — E785 Hyperlipidemia, unspecified: Secondary | ICD-10-CM

## 2011-11-01 DIAGNOSIS — R5383 Other fatigue: Secondary | ICD-10-CM

## 2011-11-01 LAB — TSH: TSH: 0.69 u[IU]/mL (ref 0.35–5.50)

## 2011-11-01 LAB — COMPLETE METABOLIC PANEL WITH GFR
CO2: 26 mEq/L (ref 19–32)
Creat: 0.73 mg/dL (ref 0.50–1.10)
GFR, Est African American: >89 mL/min
GFR, Est Non African American: >89 mL/min
Glucose, Bld: 102 mg/dL — ABNORMAL HIGH (ref 70–99)
Total Bilirubin: 0.5 mg/dL (ref 0.3–1.2)

## 2011-11-01 LAB — CBC WITH DIFFERENTIAL/PLATELET
Basophils Absolute: 0 10*3/uL (ref 0.0–0.1)
Basophils Relative: 0.6 % (ref 0.0–3.0)
Eosinophils Relative: 3.5 % (ref 0.0–5.0)
Hemoglobin: 13.4 g/dL (ref 12.0–15.0)
Lymphocytes Relative: 35.5 % (ref 12.0–46.0)
Monocytes Relative: 6.8 % (ref 3.0–12.0)
Neutro Abs: 2.9 10*3/uL (ref 1.4–7.7)
RBC: 4.46 Mil/uL (ref 3.87–5.11)
WBC: 5.4 10*3/uL (ref 4.5–10.5)

## 2011-11-01 LAB — HEPATIC FUNCTION PANEL
AST: 27 U/L (ref 0–37)
Albumin: 3.7 g/dL (ref 3.5–5.2)
Alkaline Phosphatase: 50 U/L (ref 39–117)
Total Protein: 6.8 g/dL (ref 6.0–8.3)

## 2011-11-01 LAB — LIPID PANEL
HDL: 42.2 mg/dL (ref >39.00)
Triglycerides: 139 mg/dL (ref 0.0–149.0)
VLDL: 27.8 mg/dL (ref 0.0–40.0)

## 2011-11-03 ENCOUNTER — Ambulatory Visit (INDEPENDENT_AMBULATORY_CARE_PROVIDER_SITE_OTHER): Payer: BC Managed Care – PPO | Admitting: Internal Medicine

## 2011-11-03 ENCOUNTER — Encounter: Payer: Self-pay | Admitting: Internal Medicine

## 2011-11-03 VITALS — BP 120/78 | HR 84 | Temp 98.1°F | Resp 16 | Wt 191.0 lb

## 2011-11-03 DIAGNOSIS — Z0000 Encounter for general adult medical examination without abnormal findings: Secondary | ICD-10-CM | POA: Insufficient documentation

## 2011-11-03 DIAGNOSIS — Z0001 Encounter for general adult medical examination with abnormal findings: Secondary | ICD-10-CM | POA: Insufficient documentation

## 2011-11-03 DIAGNOSIS — Z124 Encounter for screening for malignant neoplasm of cervix: Secondary | ICD-10-CM

## 2011-11-03 DIAGNOSIS — Z1239 Encounter for other screening for malignant neoplasm of breast: Secondary | ICD-10-CM

## 2011-11-03 DIAGNOSIS — K5900 Constipation, unspecified: Secondary | ICD-10-CM

## 2011-11-03 MED ORDER — ALPRAZOLAM 0.5 MG PO TABS: 1.0000 mg | ORAL_TABLET | Freq: Two times a day (BID) | ORAL | Status: DC | PRN

## 2011-11-03 MED ORDER — CIPROFLOXACIN HCL 500 MG PO TABS: 500.0000 mg | ORAL_TABLET | Freq: Two times a day (BID) | ORAL | Status: AC

## 2011-11-03 MED ORDER — AMOXICILLIN-POT CLAVULANATE 875-125 MG PO TABS: 1.0000 | ORAL_TABLET | Freq: Two times a day (BID) | ORAL | Status: AC

## 2011-11-03 NOTE — Patient Instructions (Signed)
Consider the Low Glycemic Index Diet and 6 smaller meals daily .  This boosts your metabolism and regulates your sugars:   7 AM Low carbohydrate Protein  Shakes (EAS Carb Control  Or Atkins ,  Available everywhere,   In  cases at BJs )  2.5 carbs  (Add or substitute a toasted sandwhich thin w/ peanut butter)  10 AM: Protein bar by Atkins (snack size,  Chocolate lover's variety at  [employer]J's)    Lunch: sandwich on pita bread or flatbread (Joseph's makes a pita bread and a flat bread , available at [employer] and [employer]J's; Toufayah makes a low carb flatbread available at [employer] and [employer]) Mission makes a low carb whole wheat tortilla available at [employer] most grocery stores   3 PM:  Mid day :  Another protein bar,  Or a  cheese stick, 1/4 cup of almonds, walnuts, pistachios, pecans, peanuts,  Macadamia nuts  6 PM  Dinner:  "mean and green:"  Meat/chicken/fish, salad, and green veggie : use ranch, vinagrette,  Blue cheese, etc  9 PM snack : Breyer's low carb fudgsicle or  ice cream bar (Carb Smart), or  Weight Watcher's ice cream bar , or another protein shake  You may substitute snacks as long as the total net carb content is < 20

## 2011-11-03 NOTE — Assessment & Plan Note (Signed)
Resolved 6 months post op, off all constipation meds.

## 2011-11-03 NOTE — Progress Notes (Signed)
Patient ID: Dawn Griffith, female   DOB: 08/01/1965, 46 y.o.   MRN: [id number]  Patient Active Problem List  Diagnoses  . Chest pain  . GERD (gastroesophageal reflux disease)  . Anxiety  . Epigastric abdominal pain  . Choledocholithiasis with obstruction  . Constipation  . Palpitations  . Routine general medical examination at a health care facility    Subjective:  CC:   Chief Complaint  Patient presents with  . Gynecologic Exam    HPI:   Dawn Griffith a 46 y.o. female who presents for annual PE,  CC is increased emotional stress from several sources,  Her husband recently underwent prostate CA surgery 6 weeks ago.  Her daughter applying for Med school,  Took the [place].  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  Her constipation has spontaneously resolved. She is sleeping well,  Has a good appetite, wears her seatbelt 100% of the time.    Past Medical History  Diagnosis Date  . Cancer     as a child  . Hypertension     Past Surgical History  Procedure Date  . Cesarean section 1992  . Ectopic pregnancy surgery 1997  . Lymph node biopsy   . Nasal sinus surgery   . Colonoscopy          The following portions of the patient's history were reviewed and updated as appropriate: Allergies, current medications, and problem list.    Review of Systems:   12 Pt  review of systems was negative except those addressed in the HPI,     History   Social History  . Marital Status: Married    Spouse Name: N/A    Number of Children: N/A  . Years of Education: N/A   Occupational History  . Not on file.   Social History Main Topics  . Smoking status: Never Smoker   . Smokeless tobacco: Never Used  . Alcohol Use: No  . Drug Use: No  . Sexually Active: Not on file   Other Topics Concern  . Not on file   Social History Narrative  . No narrative on file    Objective:  BP 120/78  Pulse 84  Temp(Src) 98.1  F (36.7 C) (Oral)  Resp 16  Wt 191 lb (86.637 kg)  SpO2 98%  LMP 10/25/2011  BP 120/78  Pulse 84  Temp(Src) 98.1 F (36.7 C) (Oral)  Resp 16  Wt 191 lb (86.637 kg)  SpO2 98%  LMP 10/25/2011  General Appearance:    Alert, cooperative, no distress, appears stated age  Head:    Normocephalic, without obvious abnormality, atraumatic  Eyes:    PERRL, conjunctiva/corneas clear, EOM's intact, fundi    benign, both eyes  Ears:    Normal TM's and external ear canals, both ears  Nose:   Nares normal, septum midline, mucosa normal, no drainage    or sinus tenderness  Throat:   Lips, mucosa, and tongue normal; teeth and gums normal  Neck:   Supple, symmetrical, trachea midline, no adenopathy;    thyroid:  no enlargement/tenderness/nodules; no carotid   bruit or JVD  Back:     Symmetric, no curvature, ROM normal, no CVA tenderness  Lungs:     Clear to auscultation bilaterally, respirations unlabored  Chest Wall:    No tenderness or deformity   Heart:    Regular rate and rhythm, S1 and S2 normal, no murmur,  rub   or gallop  Breast Exam:    No tenderness, masses, or nipple abnormality  Abdomen:     Soft, non-tender, bowel sounds active all four quadrants,    no masses, no organomegaly  Genitalia:    Normal female without lesion, discharge or tenderness  Rectal:    Normal tone, normal prostate, no masses or tenderness;   guaiac negative stool  Extremities:   Extremities normal, atraumatic, no cyanosis or edema  Pulses:   2+ and symmetric all extremities  Skin:   Skin color, texture, turgor normal, no rashes or lesions  Lymph nodes:   Cervical, supraclavicular, and axillary nodes normal  Neurologic:   CNII-XII intact, normal strength, sensation and reflexes    throughout   Assessment and Plan:  Constipation Resolved 6 months post op, off all constipation meds.  Routine general medical examination at a health care facility Full exam done today wit PAP and cultrures sent.      Updated Medication List Outpatient Encounter Prescriptions as of 11/03/2011  Medication Sig Dispense Refill  . ALPRAZolam (XANAX) 0.5 MG tablet Take 2 tablets (1 mg total) by mouth 2 (two) times daily as needed for sleep or anxiety.  60 tablet  3  . amitriptyline (ELAVIL) 100 MG tablet Take 1 tablet (100 mg total) by mouth at bedtime.  30 tablet  5  . cetirizine (ZYRTEC) 10 MG tablet Take 10 mg by mouth daily.        Marland Kitchen losartan (COZAAR) 100 MG tablet Take 1 tablet (100 mg total) by mouth daily.  30 tablet  3  . norgestrel-ethinyl estradiol (LO/OVRAL,CRYSELLE) 0.3-30 MG-MCG tablet Take 1 tablet by mouth daily.        Marland Kitchen omeprazole (PRILOSEC) 40 MG capsule Take 1 capsule (40 mg total) by mouth daily.  30 capsule  5  . propranolol (INDERAL) 20 MG tablet Take 1 tablet (20 mg total) by mouth as needed. For rapid heart rate  30 tablet  0  . zolpidem (AMBIEN CR) 12.5 MG CR tablet Take 1 tablet (12.5 mg total) by mouth at bedtime as needed.  30 tablet  4  . DISCONTD: ALPRAZolam (XANAX) 0.5 MG tablet Take 2 tablets (1 mg total) by mouth 2 (two) times daily as needed for sleep or anxiety.  60 tablet  3  . amoxicillin-clavulanate (AUGMENTIN) 875-125 MG per tablet Take 1 tablet by mouth 2 (two) times daily.  14 tablet  0  . ciprofloxacin (CIPRO) 500 MG tablet Take 1 tablet (500 mg total) by mouth 2 (two) times daily.  20 tablet  0  . DISCONTD: AMITIZA 24 MCG capsule TAKE 1 CAPSULE BY MOUTH 2 TIMES DAILY WITH A MEAL.  60 capsule  3  . DISCONTD: Lactulose 20 GM/30ML SOLN Take 30 mLs (20 g total) by mouth every 4 (four) hours as needed (constipation).  240 mL  1     Orders Placed This Encounter  Procedures  . MM Digital Screening    No Follow-up on file.

## 2011-11-05 NOTE — Assessment & Plan Note (Signed)
Full exam done today wit PAP and cultrures sent.

## 2011-11-06 ENCOUNTER — Other Ambulatory Visit (HOSPITAL_COMMUNITY)
Admission: RE | Admit: 2011-11-06 | Discharge: 2011-11-06 | Disposition: A | Discharge status: Home / Self Care | Payer: BC Managed Care – PPO | Source: Ambulatory Visit | Attending: Internal Medicine | Admitting: Internal Medicine

## 2011-11-06 DIAGNOSIS — N760 Acute vaginitis: Secondary | ICD-10-CM | POA: Insufficient documentation

## 2011-11-06 DIAGNOSIS — Z01419 Encounter for gynecological examination (general) (routine) without abnormal findings: Secondary | ICD-10-CM | POA: Insufficient documentation

## 2011-11-06 LAB — HM PAP SMEAR: HM Pap smear: NORMAL

## 2011-11-06 NOTE — Progress Notes (Signed)
Addended by: Darletta Moll A on: 11/06/2011 08:48 AM   Modules accepted: Orders

## 2011-11-09 ENCOUNTER — Telehealth: Payer: Self-pay | Admitting: Internal Medicine

## 2011-11-09 NOTE — Telephone Encounter (Signed)
Pt called checking on results for pap and culture Please advise Pt had mammogram done yesterday [facility] image

## 2011-11-09 NOTE — Telephone Encounter (Signed)
Patient notified that we do not have results in yet and will call her as soon as we do.

## 2011-11-17 ENCOUNTER — Telehealth: Payer: Self-pay | Admitting: Internal Medicine

## 2011-11-17 NOTE — Telephone Encounter (Signed)
Patient notified

## 2011-11-17 NOTE — Telephone Encounter (Signed)
No, I had not,  No idea how that happened.  All are normal. Cholesterol is fine

## 2011-11-17 NOTE — Telephone Encounter (Signed)
Patient called wanting her lab results from 5/29, they are in her chart but I'm not sure that you have seen them.  Please advise.

## 2011-11-25 ENCOUNTER — Other Ambulatory Visit: Payer: Self-pay | Admitting: Internal Medicine

## 2011-11-28 ENCOUNTER — Encounter: Payer: Self-pay | Admitting: Internal Medicine

## 2011-12-06 ENCOUNTER — Other Ambulatory Visit: Payer: Self-pay | Admitting: *Deleted

## 2011-12-08 MED ORDER — ZOLPIDEM TARTRATE ER 12.5 MG PO TBCR: 12.5000 mg | EXTENDED_RELEASE_TABLET | Freq: Every evening | ORAL | Status: DC | PRN

## 2011-12-12 ENCOUNTER — Other Ambulatory Visit: Payer: Self-pay | Admitting: Internal Medicine

## 2011-12-22 ENCOUNTER — Telehealth: Payer: Self-pay | Admitting: Internal Medicine

## 2011-12-22 NOTE — Telephone Encounter (Signed)
rx phoned in. Patient aware.

## 2011-12-22 NOTE — Telephone Encounter (Signed)
[facility]'t [facility] please call them with this they are on Dawn Griffith. I advised patient that it was called into the pharmacy on July 3 but she states they are telling her they don't have it. Please call in again for her.

## 2012-01-16 ENCOUNTER — Other Ambulatory Visit: Payer: Self-pay | Admitting: Internal Medicine

## 2012-01-16 MED ORDER — AMITRIPTYLINE HCL 100 MG PO TABS: 100.0000 mg | ORAL_TABLET | Freq: Every day | ORAL | Status: DC

## 2012-02-23 ENCOUNTER — Ambulatory Visit (INDEPENDENT_AMBULATORY_CARE_PROVIDER_SITE_OTHER): Payer: BC Managed Care – PPO | Admitting: Internal Medicine

## 2012-02-23 DIAGNOSIS — Z23 Encounter for immunization: Secondary | ICD-10-CM

## 2012-03-15 ENCOUNTER — Ambulatory Visit (INDEPENDENT_AMBULATORY_CARE_PROVIDER_SITE_OTHER): Payer: BC Managed Care – PPO | Admitting: Internal Medicine

## 2012-03-15 ENCOUNTER — Encounter: Payer: Self-pay | Admitting: Internal Medicine

## 2012-03-15 VITALS — BP 120/64 | HR 96 | Temp 98.1°F | Ht 69.5 in | Wt 197.0 lb

## 2012-03-15 DIAGNOSIS — N912 Amenorrhea, unspecified: Secondary | ICD-10-CM

## 2012-03-15 DIAGNOSIS — N911 Secondary amenorrhea: Secondary | ICD-10-CM

## 2012-03-15 MED ORDER — SPIRONOLACTONE 25 MG PO TABS: 25.0000 mg | ORAL_TABLET | Freq: Every day | ORAL | Status: DC

## 2012-03-15 MED ORDER — LOSARTAN POTASSIUM 100 MG PO TABS: 100.0000 mg | ORAL_TABLET | Freq: Every day | ORAL | Status: DC

## 2012-03-15 NOTE — Progress Notes (Signed)
Patient ID: Dawn Griffith, female   DOB: 10-13-1965, 46 y.o.   MRN: [id number]  Patient Active Problem List  Diagnosis  . Chest pain  . GERD (gastroesophageal reflux disease)  . Anxiety  . Epigastric abdominal pain  . Choledocholithiasis with obstruction  . Constipation  . Palpitations  . Routine general medical examination at a health care facility  . Amenorrhea following discontinuation of oral contraceptive use    Subjective:  CC:   No chief complaint on file.   HPI:   Dawn Colton Dunnis a 46 y.o. female who presents with amenorrhea since August.  She has used 4 pregnancy tests that were all negative. She has noted increased fluid retention, but denies breast tenderness.  husband has had a prostatectomy and does not ejaculate so there is little chance of pregnancy. She has not taken any birth control since August.  She has a few symptoms that feel like hot flashesbut are  limited to her facial cheeks turning red intermittently the week before she was scheduled to have her menses.  .     Past Medical History  Diagnosis Date  . Cancer     as a child  . Hypertension     Past Surgical History  Procedure Date  . Cesarean section 1992  . Ectopic pregnancy surgery 1997  . Lymph node biopsy   . Nasal sinus surgery   . Colonoscopy          The following portions of the patient's history were reviewed and updated as appropriate: Allergies, current medications, and problem list.    Review of Systems:   12 Pt  review of systems was negative except those addressed in the HPI,     History   Social History  . Marital Status: Married    Spouse Name: N/A    Number of Children: N/A  . Years of Education: N/A   Occupational History  . Not on file.   Social History Main Topics  . Smoking status: Never Smoker   . Smokeless tobacco: Never Used  . Alcohol Use: No  . Drug Use: No  . Sexually Active: Not on file   Other Topics Concern  . Not on file   Social  History Narrative  . No narrative on file    Objective:  BP 120/64  Pulse 96  Temp 98.1 F (36.7 C) (Oral)  Ht 5' 9.5" (1.765 m)  Wt 197 lb (89.359 kg)  BMI 28.67 kg/m2  SpO2 99%  LMP 01/24/2012  General appearance: alert, cooperative and appears stated age Neck: no adenopathy, no carotid bruit, supple, symmetrical, trachea midline and thyroid not enlarged, symmetric, no tenderness/mass/nodules Back: symmetric, no curvature. ROM normal. No CVA tenderness. Lungs: clear to auscultation bilaterally Heart: regular rate and rhythm, S1, S2 normal, no murmur, click, rub or gallop Abdomen: soft, non-tender; bowel sounds normal; no masses,  no organomegaly Pulses: 2+ and symmetric Skin: Skin color, texture, turgor normal. No rashes or lesions Lymph nodes: Cervical, supraclavicular, and axillary nodes normal.  Assessment and Plan:  Amenorrhea following discontinuation of oral contraceptive use Etiology may be menospause since her grandmother had menopause  Early. Reassurance provided and nonpregnancy confirmed with  Repeat UPT negative.  Will reassess for menopause if she has not had a menses in 3 months.    Updated Medication List Outpatient Encounter Prescriptions as of 03/15/2012  Medication Sig Dispense Refill  . ALPRAZolam (XANAX) 0.5 MG tablet Take 2 tablets (1 mg total) by mouth 2 (two)  times daily as needed for sleep or anxiety.  60 tablet  3  . amitriptyline (ELAVIL) 100 MG tablet Take 1 tablet (100 mg total) by mouth at bedtime.  30 tablet  5  . cetirizine (ZYRTEC) 10 MG tablet Take 10 mg by mouth daily.        Marland Kitchen losartan (COZAAR) 100 MG tablet Take 1 tablet (100 mg total) by mouth daily.  30 tablet  3  . omeprazole (PRILOSEC) 40 MG capsule Take 1 capsule (40 mg total) by mouth daily.  30 capsule  5  . zolpidem (AMBIEN CR) 12.5 MG CR tablet Take 1 tablet (12.5 mg total) by mouth at bedtime as needed.  30 tablet  4  . DISCONTD: losartan (COZAAR) 100 MG tablet Take 1 tablet  (100 mg total) by mouth daily.  30 tablet  3  . CRYSELLE-28 0.3-30 MG-MCG tablet 1 TABLET, ORAL, DAILY  84 tablet  0  . propranolol (INDERAL) 20 MG tablet Take 1 tablet (20 mg total) by mouth as needed. For rapid heart rate  30 tablet  0  . spironolactone (ALDACTONE) 25 MG tablet Take 1 tablet (25 mg total) by mouth daily.  30 tablet  3     Orders Placed This Encounter  Procedures  . POCT urine pregnancy    No Follow-up on file.

## 2012-03-15 NOTE — Patient Instructions (Addendum)
This is  Dr. Tullos's version of a  "Low GI"  Diet:  All of the foods can be found at grocery stores and in bulk at [employer].  The Atkins protein bars and shakes are available in more varieties at [employer], [employer] and [employer].     7 AM Breakfast:  Low carbohydrate Protein  Shakes (I recommend the EAS AdvantEdge "Carb Control" shakes  Or the low carb shakes by Atkins.   Both are available everywhere:  In  cases at BJs  Or in 4 packs at grocery stores and pharmacies  2.5 carbs  (Alternative is  a toasted Arnold's Sandwhich Thin w/ peanut butter, a "Bagel Thin" with cream cheese and salmon) or  a scrambled egg burrito made with a low carb tortilla .  Avoid cereal and bananas, oatmeal too unless you are cooking the old fashioned kind that takes 30-40 minutes to prepare.  the rest is overly processed, has minimal fiber, and is loaded with carbohydrates!   10 AM: Protein bar by Atkins (the snack size, under 200 cal).  There are many varieties , available widely again or in bulk in limited varieties at BJs)  Other so called "protein bars" tend to be loaded with carbohydrates.  Remember, in food advertising, the word "energy" is synonymous for " carbohydrate."  Lunch: sandwich of turkey, (or any lunchmeat, grilled meat or canned tuna), fresh avocado, mayonnaise  and cheese on a lower carbohydrate pita bread, flatbread, or tortilla . Ok to use regular mayonnaise. The bread is the only source or carbohydrate that can be decreased (Joseph's makes a pita bread and a flat bread that are 50 cal and 4 net carbs ; Toufayan makes a low carb flatbread that's 100 cal and 9 net carbs  and  Mission makes a low carb whole wheat tortilla  That is 210 cal and 6 net carbs)  3 PM:  Mid day :  Another protein bar,  Or a  cheese stick (100 cal, 0 carbs),  Or 1 ounce of  almonds, walnuts, pistachios, pecans, peanuts,  Macadamia nuts. Or a Dannon light n Fit greek yogurt, 80 cal 8 net carbs . Avoid "granola"; the dried cranberries  and raisins are loaded with carbohydrates. Mixed nuts ok if no raisins or cranberries or dried fruit.      6 PM  Dinner:  "mean and green:"  Meat/chicken/fish or a high protein legume; , with a green salad, and a low GI  Veggie (broccoli, cauliflower, green beans, spinach, brussel sprouts. Lima beans) : Avoid "Low fat dressings, as well as [place] and [place]! They are loaded with sugar! Instead use ranch, vinagrette,  Blue cheese, etc  9 PM snack : Breyer's "low carb" fudgsicle or  ice cream bar (Carb Smart line), or  Weight Watcher's ice cream bar , or another "no sugar added" ice cream;a serving of fresh berries/cherries with whipped cream (Avoid bananas, pineapple, grapes  and watermelon on a regular basis because they are high in sugar)   Remember that snack Substitutions should be less than 15 to 20 carbs  Per serving. Remember to subtract fiber grams and sugar alcohols to get the "net carbs."    

## 2012-03-16 DIAGNOSIS — N912 Amenorrhea, unspecified: Secondary | ICD-10-CM | POA: Insufficient documentation

## 2012-03-16 NOTE — Assessment & Plan Note (Signed)
Etiology may be menospause since her grandmother had menopause  Early. Reassurance provided and nonpregnancy confirmed with  Repeat UPT negative.  Will reassess for menopause if she has not had a menses in 3 months.

## 2012-04-09 ENCOUNTER — Other Ambulatory Visit: Payer: Self-pay | Admitting: Internal Medicine

## 2012-04-10 NOTE — Telephone Encounter (Signed)
[facility] by April M.

## 2012-04-10 NOTE — Telephone Encounter (Signed)
Refill authorized,  On printer

## 2012-05-10 ENCOUNTER — Other Ambulatory Visit: Payer: Self-pay | Admitting: Internal Medicine

## 2012-05-25 ENCOUNTER — Other Ambulatory Visit: Payer: Self-pay | Admitting: Internal Medicine

## 2012-05-26 NOTE — Telephone Encounter (Signed)
Ok to refill zolpidem  Authorized in epic

## 2012-05-27 ENCOUNTER — Telehealth: Payer: Self-pay | Admitting: General Practice

## 2012-05-27 NOTE — Telephone Encounter (Signed)
Med called in to pharmacy.

## 2012-05-28 NOTE — Telephone Encounter (Signed)
Made in error

## 2012-06-19 ENCOUNTER — Other Ambulatory Visit: Payer: Self-pay | Admitting: Internal Medicine

## 2012-06-19 NOTE — Telephone Encounter (Signed)
Med filled.  

## 2012-08-03 ENCOUNTER — Other Ambulatory Visit: Payer: Self-pay | Admitting: Internal Medicine

## 2012-08-05 ENCOUNTER — Telehealth: Payer: Self-pay | Admitting: Internal Medicine

## 2012-08-05 MED ORDER — AMITRIPTYLINE HCL 100 MG PO TABS: 100.0000 mg | ORAL_TABLET | Freq: Every day | ORAL | Status: DC

## 2012-08-05 NOTE — Telephone Encounter (Signed)
amitriptyline (ELAVIL) 100 MG tablet   # 30

## 2012-08-27 ENCOUNTER — Other Ambulatory Visit: Payer: Self-pay | Admitting: *Deleted

## 2012-08-27 MED ORDER — ALPRAZOLAM 0.5 MG PO TABS: ORAL_TABLET | ORAL | Status: DC

## 2012-10-24 ENCOUNTER — Other Ambulatory Visit: Payer: Self-pay | Admitting: Internal Medicine

## 2012-10-24 NOTE — Telephone Encounter (Signed)
Okay to refill? 

## 2012-10-25 ENCOUNTER — Telehealth: Payer: Self-pay | Admitting: Internal Medicine

## 2012-10-25 MED ORDER — ZOLPIDEM TARTRATE ER 12.5 MG PO TBCR: EXTENDED_RELEASE_TABLET | ORAL | Status: DC

## 2012-10-25 NOTE — Telephone Encounter (Signed)
Ok to refill ambien,  Authorized in epic 

## 2012-10-25 NOTE — Telephone Encounter (Signed)
Pt checking on ambien refill  Please advise when called in

## 2012-10-25 NOTE — Telephone Encounter (Signed)
Patient only has 1 pill left of her Ambien . Would like this medication filled today.

## 2012-10-25 NOTE — Telephone Encounter (Signed)
Rx faxed to pharmacy, pt notified

## 2012-11-04 ENCOUNTER — Encounter: Payer: Self-pay | Admitting: Internal Medicine

## 2012-11-04 ENCOUNTER — Ambulatory Visit (INDEPENDENT_AMBULATORY_CARE_PROVIDER_SITE_OTHER): Payer: BC Managed Care – PPO | Admitting: Internal Medicine

## 2012-11-04 VITALS — BP 124/72 | HR 101 | Temp 98.2°F | Resp 14 | Ht 70.0 in | Wt 195.0 lb

## 2012-11-04 DIAGNOSIS — D649 Anemia, unspecified: Secondary | ICD-10-CM

## 2012-11-04 DIAGNOSIS — Z01411 Encounter for gynecological examination (general) (routine) with abnormal findings: Secondary | ICD-10-CM | POA: Insufficient documentation

## 2012-11-04 DIAGNOSIS — E663 Overweight: Secondary | ICD-10-CM

## 2012-11-04 DIAGNOSIS — R5383 Other fatigue: Secondary | ICD-10-CM

## 2012-11-04 DIAGNOSIS — Z13220 Encounter for screening for lipoid disorders: Secondary | ICD-10-CM

## 2012-11-04 DIAGNOSIS — Z6825 Body mass index (BMI) 25.0-25.9, adult: Secondary | ICD-10-CM

## 2012-11-04 DIAGNOSIS — R9389 Abnormal findings on diagnostic imaging of other specified body structures: Secondary | ICD-10-CM

## 2012-11-04 DIAGNOSIS — Z124 Encounter for screening for malignant neoplasm of cervix: Secondary | ICD-10-CM

## 2012-11-04 DIAGNOSIS — Z1239 Encounter for other screening for malignant neoplasm of breast: Secondary | ICD-10-CM

## 2012-11-04 DIAGNOSIS — Z0000 Encounter for general adult medical examination without abnormal findings: Secondary | ICD-10-CM

## 2012-11-04 DIAGNOSIS — R635 Abnormal weight gain: Secondary | ICD-10-CM

## 2012-11-04 HISTORY — DX: Overweight: E66.3

## 2012-11-04 LAB — COMPREHENSIVE METABOLIC PANEL
Alkaline Phosphatase: 59 U/L (ref 39–117)
CO2: 17 mEq/L — ABNORMAL LOW (ref 19–32)
Creatinine, Ser: 0.7 mg/dL (ref 0.4–1.2)
GFR: 93.01 mL/min (ref >60.00)
Glucose, Bld: 105 mg/dL — ABNORMAL HIGH (ref 70–99)
Sodium: 136 mEq/L (ref 135–145)
Total Bilirubin: 0.4 mg/dL (ref 0.3–1.2)
Total Protein: 7 g/dL (ref 6.0–8.3)

## 2012-11-04 LAB — CBC WITH DIFFERENTIAL/PLATELET
Basophils Absolute: 0 10*3/uL (ref 0.0–0.1)
HCT: 33.6 % — ABNORMAL LOW (ref 36.0–46.0)
Lymphs Abs: 2.1 10*3/uL (ref 0.7–4.0)
Monocytes Relative: 5.9 % (ref 3.0–12.0)
Platelets: 280 10*3/uL (ref 150.0–400.0)
RDW: 17.1 % — ABNORMAL HIGH (ref 11.5–14.6)

## 2012-11-04 LAB — LIPID PANEL: HDL: 36.6 mg/dL — ABNORMAL LOW (ref >39.00)

## 2012-11-04 LAB — TSH: TSH: 0.2 u[IU]/mL — ABNORMAL LOW (ref 0.35–5.50)

## 2012-11-04 MED ORDER — CIPROFLOXACIN HCL 500 MG PO TABS: 500.0000 mg | ORAL_TABLET | Freq: Two times a day (BID) | ORAL | Status: DC

## 2012-11-04 MED ORDER — PHENTERMINE HCL 37.5 MG PO TABS: 37.5000 mg | ORAL_TABLET | Freq: Every day | ORAL | Status: DC

## 2012-11-04 MED ORDER — LEVOFLOXACIN 500 MG PO TABS: 500.0000 mg | ORAL_TABLET | Freq: Every day | ORAL | Status: DC

## 2012-11-04 MED ORDER — ALPRAZOLAM 0.5 MG PO TABS: ORAL_TABLET | ORAL | Status: DC

## 2012-11-04 NOTE — Assessment & Plan Note (Signed)
ultrasoudn ordered to investgate abnormal fullness on exam involving lower uterus.RLQ

## 2012-11-04 NOTE — Assessment & Plan Note (Signed)
I have addressed  BMI and recommended a low glycemic index diet utilizing smaller more frequent meals to increase metabolism. Trial of phentermine 18.75 mg daily for appetite suppression.  I have also recommended that patient start exercising with a goal of 30 minutes of aerobic exercise a minimum of 5 days per week. Screening for lipid disorders, thyroid and diabetes to be done today.

## 2012-11-04 NOTE — Assessment & Plan Note (Signed)
Annual comprehensive exam was done including breast, and pelvic without PAP smear. All screenings have been addressed .

## 2012-11-04 NOTE — Patient Instructions (Signed)
I am prescribving an appetite suppressant called phentermine  Please take only 1/2 tablet daily in the morning.  Return in one month for follow up quick visit with me or Raquel

## 2012-11-04 NOTE — Progress Notes (Signed)
Patient ID: Dawn Griffith, female   DOB: [birth year], 47 y.o.   MRN: [id number]    Subjective:     Dawn Griffith is a 47 y.o. female and is here for a comprehensive physical exam. The patient reports  Her periods have become regular again, but she has resumed her OCP because she is going to [place] on June 14th and wants to manipulate her cycle.  20 Frustrated by inability to curb her appetite,  And has gained weight as a result .  She is not exercising regularly.  She is stress eating due to husband's recent cancer diagnosis and is eating everything in sight, can't stay on the diet. Requesting help.    History   Social History  . Marital Status: Married    Spouse Name: N/A    Number of Children: N/A  . Years of Education: N/A   Occupational History  . Not on file.   Social History Main Topics  . Smoking status: Never Smoker   . Smokeless tobacco: Never Used  . Alcohol Use: No  . Drug Use: No  . Sexually Active: Not on file   Other Topics Concern  . Not on file   Social History Narrative  . No narrative on file   Health Maintenance  Topic Date Due  . Tetanus/tdap  11/21/1982  . Influenza Vaccine  02/03/2013  . Pap Smear  11/06/2014    The following portions of the patient's history were reviewed and updated as appropriate: allergies, current medications, past family history, past medical history, past social history, past surgical history and problem list.  Review of Systems A comprehensive review of systems was negative.   Objective:   BP 124/72  Pulse 101  Temp(Src) 98.2 F (36.8 C) (Oral)  Resp 14  Ht 5\' 10"  (1.778 m)  Wt 195 lb (88.451 kg)  BMI 27.98 kg/m2  SpO2 98%  LMP 10/18/2012  General Appearance:    Alert, cooperative, no distress, appears stated age  Head:    Normocephalic, without obvious abnormality, atraumatic  Eyes:    PERRL, conjunctiva/corneas clear, EOM's intact, fundi    benign, both eyes  Ears:    Normal TM's and external ear canals, both  ears  Nose:   Nares normal, septum midline, mucosa normal, no drainage    or sinus tenderness  Throat:   Lips, mucosa, and tongue normal; teeth and gums normal  Neck:   Supple, symmetrical, trachea midline, no adenopathy;    thyroid:  no enlargement/tenderness/nodules; no carotid   bruit or JVD  Back:     Symmetric, no curvature, ROM normal, no CVA tenderness  Lungs:     Clear to auscultation bilaterally, respirations unlabored  Chest Wall:    No tenderness or deformity   Heart:    Regular rate and rhythm, S1 and S2 normal, no murmur, rub   or gallop  Breast Exam:    No tenderness, masses, or nipple abnormality  Abdomen:     Soft, non-tender, bowel sounds active all four quadrants,    no masses, no organomegaly. RUQ surgical scar.   Genitalia:    Pelvic: cervix normal in appearance, external genitalia normal, no adnexal masses or tenderness, no cervical motion tenderness, rectovaginal septum normal, uterus enalrged with abnormal fullness in the RLQ . vagina normal without discharge  Extremities:   Extremities normal, atraumatic, no cyanosis or edema  Pulses:   2+ and symmetric all extremities  Skin:   Skin color, texture, turgor normal, no  rashes or lesions  Lymph nodes:   Cervical, supraclavicular, and axillary nodes normal  Neurologic:   CNII-XII intact, normal strength, sensation and reflexes    throughout    Assessment:   Routine general medical examination at a health care facility Annual comprehensive exam was done including breast, and pelvic without PAP smear. All screenings have been addressed .     Overweight (BMI 25.0-29.9) I have addressed  BMI and recommended a low glycemic index diet utilizing smaller more frequent meals to increase metabolism. Trial of phentermine 18.75 mg daily for appetite suppression.  I have also recommended that patient start exercising with a goal of 30 minutes of aerobic exercise a minimum of 5 days per week. Screening for lipid disorders, thyroid  and diabetes to be done today.     Abnormal pelvic exam ultrasoudn ordered to investgate abnormal fullness on exam involving lower uterus.RLQ    Updated Medication List Outpatient Encounter Prescriptions as of 11/04/2012  Medication Sig Dispense Refill  . ALPRAZolam (XANAX) 0.5 MG tablet TAKE 2 TABLETS BY MOUTH TWICE A DAY AS NEEDED FOR SLEEP OR ANXIETY  60 tablet  3  . amitriptyline (ELAVIL) 100 MG tablet Take 1 tablet (100 mg total) by mouth at bedtime.  30 tablet  5  . cetirizine (ZYRTEC) 10 MG tablet Take 10 mg by mouth daily.        . CRYSELLE-28 0.3-30 MG-MCG tablet 1 TABLET, ORAL, DAILY  84 tablet  0  . losartan (COZAAR) 100 MG tablet TAKE 1 TABLET (100 MG TOTAL) BY MOUTH DAILY.  90 tablet  3  . omeprazole (PRILOSEC) 40 MG capsule TAKE 1 CAPSULE (40 MG TOTAL) BY MOUTH DAILY.  30 capsule  5  . spironolactone (ALDACTONE) 25 MG tablet TAKE 1 TABLET BY MOUTH DAILY.  30 tablet  3  . zolpidem (AMBIEN CR) 12.5 MG CR tablet TAKE 1 TABLET BY MOUTH AT BEDTIME AS NEEDED  30 tablet  4  . [DISCONTINUED] ALPRAZolam (XANAX) 0.5 MG tablet TAKE 2 TABLETS BY MOUTH TWICE A DAY AS NEEDED FOR SLEEP OR ANXIETY  60 tablet  3  . [DISCONTINUED] losartan (COZAAR) 100 MG tablet Take 1 tablet (100 mg total) by mouth daily.  30 tablet  3  . ciprofloxacin (CIPRO) 500 MG tablet Take 1 tablet (500 mg total) by mouth 2 (two) times daily. For diarrhea or urinary tract infection  10 tablet  0  . levofloxacin (LEVAQUIN) 500 MG tablet Take 1 tablet (500 mg total) by mouth daily. For respiratory infection  7 tablet  0  . phentermine (ADIPEX-P) 37.5 MG tablet Take 1 tablet (37.5 mg total) by mouth daily before breakfast.  30 tablet  0   No facility-administered encounter medications on file as of 11/04/2012.

## 2012-11-05 ENCOUNTER — Encounter: Payer: Self-pay | Admitting: Internal Medicine

## 2012-11-05 NOTE — Addendum Note (Signed)
Addended by: Sherlene Shams on: 11/05/2012 06:54 AM   Modules accepted: Orders

## 2012-11-08 ENCOUNTER — Telehealth: Payer: Self-pay | Admitting: Internal Medicine

## 2012-11-08 NOTE — Telephone Encounter (Signed)
Pt had pelvic ultra sound done today @[facility].  Pt would like to get her results asap.

## 2012-11-10 ENCOUNTER — Telehealth: Payer: Self-pay | Admitting: Internal Medicine

## 2012-11-10 NOTE — Telephone Encounter (Signed)
I have not received  The ultrasound results,  only her mammogram which was normal

## 2012-11-11 ENCOUNTER — Telehealth: Payer: Self-pay | Admitting: Internal Medicine

## 2012-11-11 NOTE — Telephone Encounter (Signed)
The pelvic ultrasound shoed multiple small uterine fibroid tumors (benign) , which is what I was feeling .   The ovaries were not seen no matter how hard they tried.

## 2012-11-11 NOTE — Telephone Encounter (Signed)
Patient notified

## 2012-11-11 NOTE — Telephone Encounter (Signed)
Notified patient that her pelvic ultrasound showed multiple small uterine fibroid tumors (benign), which was what you feeling. And the ovaries were not seen no matter how hard they tired.

## 2012-11-27 ENCOUNTER — Ambulatory Visit (INDEPENDENT_AMBULATORY_CARE_PROVIDER_SITE_OTHER): Payer: BC Managed Care – PPO | Admitting: Internal Medicine

## 2012-11-27 ENCOUNTER — Encounter: Payer: Self-pay | Admitting: Internal Medicine

## 2012-11-27 VITALS — BP 126/78 | HR 99 | Temp 98.9°F | Resp 14 | Wt 188.0 lb

## 2012-11-27 DIAGNOSIS — R9389 Abnormal findings on diagnostic imaging of other specified body structures: Secondary | ICD-10-CM

## 2012-11-27 DIAGNOSIS — Z01411 Encounter for gynecological examination (general) (routine) with abnormal findings: Secondary | ICD-10-CM

## 2012-11-27 DIAGNOSIS — E0590 Thyrotoxicosis, unspecified without thyrotoxic crisis or storm: Secondary | ICD-10-CM

## 2012-11-27 DIAGNOSIS — D649 Anemia, unspecified: Secondary | ICD-10-CM

## 2012-11-27 DIAGNOSIS — E663 Overweight: Secondary | ICD-10-CM

## 2012-11-27 DIAGNOSIS — Z6825 Body mass index (BMI) 25.0-25.9, adult: Secondary | ICD-10-CM

## 2012-11-27 MED ORDER — PHENTERMINE HCL 37.5 MG PO TABS: 37.5000 mg | ORAL_TABLET | Freq: Every day | ORAL | Status: DC

## 2012-11-27 MED ORDER — CIPROFLOXACIN HCL 500 MG PO TABS: 500.0000 mg | ORAL_TABLET | Freq: Two times a day (BID) | ORAL | Status: DC

## 2012-11-28 ENCOUNTER — Encounter: Payer: Self-pay | Admitting: Internal Medicine

## 2012-11-28 DIAGNOSIS — D649 Anemia, unspecified: Secondary | ICD-10-CM | POA: Insufficient documentation

## 2012-11-28 NOTE — Assessment & Plan Note (Addendum)
Microcytic,  with no history of heavy menses.  Iron and b12 studies pending .

## 2012-11-28 NOTE — Assessment & Plan Note (Signed)
Multiple fibroid tumors on pelvic ultrasound

## 2012-11-28 NOTE — Progress Notes (Signed)
Patient ID: Dawn Griffith, female   DOB: 1966/01/24, 47 y.o.   MRN: [id number]  Patient Active Problem List   Diagnosis Date Noted  . Anemia 11/28/2012  . Overweight (BMI 25.0-29.9) 11/04/2012  . Abnormal pelvic exam 11/04/2012  . Routine general medical examination at a health care facility 11/03/2011  . Constipation 05/07/2011  . GERD (gastroesophageal reflux disease) 12/30/2010  . Anxiety 12/30/2010    Subjective:  CC:   Chief Complaint  Patient presents with  . Follow-up    HPI:   Dawn Griffith a 47 y.o. female who presents for follow up on chronic conditions and recent labs and imaging studies. She had labs recently that indicated new onset anemia and recent ultrasound of uterus, which showed multiple fibroids. She has been amenorrheic since August and iron studies pending.  She has lost 8 lbs since her last visit. Her trip to [place] was a success.       Past Medical History  Diagnosis Date  . Cancer     as a child  . Hypertension     Past Surgical History  Procedure Laterality Date  . Cesarean section  1992  . Ectopic pregnancy surgery  1997  . Lymph node biopsy    . Nasal sinus surgery    . Colonoscopy    . Cholecystectomy  Dec 2012    Sankar       The following portions of the patient's history were reviewed and updated as appropriate: Allergies, current medications, and problem list.    Review of Systems:   12 Pt  review of systems was negative except those addressed in the HPI,     History   Social History  . Marital Status: Married    Spouse Name: N/A    Number of Children: N/A  . Years of Education: N/A   Occupational History  . Not on file.   Social History Main Topics  . Smoking status: Never Smoker   . Smokeless tobacco: Never Used  . Alcohol Use: No  . Drug Use: No  . Sexually Active: Not on file   Other Topics Concern  . Not on file   Social History Narrative  . No narrative on file    Objective:  BP 126/78  Pulse  99  Temp(Src) 98.9 F (37.2 C) (Oral)  Resp 14  Wt 188 lb (85.276 kg)  BMI 26.98 kg/m2  SpO2 99%  LMP 11/24/2012  General appearance: alert, cooperative and appears stated age Ears: normal TM's and external ear canals both ears Throat: lips, mucosa, and tongue normal; teeth and gums normal Neck: no adenopathy, no carotid bruit, supple, symmetrical, trachea midline and thyroid not enlarged, symmetric, no tenderness/mass/nodules Back: symmetric, no curvature. ROM normal. No CVA tenderness. Lungs: clear to auscultation bilaterally Heart: regular rate and rhythm, S1, S2 normal, no murmur, click, rub or gallop Abdomen: soft, non-tender; bowel sounds normal; no masses,  no organomegaly Pulses: 2+ and symmetric Skin: Skin color, texture, turgor normal. No rashes or lesions Lymph nodes: Cervical, supraclavicular, and axillary nodes normal.  Assessment and Plan:  Abnormal pelvic exam Multiple fibroid tumors on pelvic ultrasound   Overweight (BMI 25.0-29.9) She has lost 8 lbs since her last visit. Encouragement given. Thyroid may be overactive by recent TSH ,  Will repeat in 6 weeks.   Anemia Microcytic,  with no history of heavy menses.  Iron and b12 studies pending .   Updated Medication List Outpatient Encounter Prescriptions as of 11/27/2012  Medication  Sig Dispense Refill  . ALPRAZolam (XANAX) 0.5 MG tablet TAKE 2 TABLETS BY MOUTH TWICE A DAY AS NEEDED FOR SLEEP OR ANXIETY  60 tablet  3  . amitriptyline (ELAVIL) 100 MG tablet Take 1 tablet (100 mg total) by mouth at bedtime.  30 tablet  5  . cetirizine (ZYRTEC) 10 MG tablet Take 10 mg by mouth daily.        Marland Kitchen losartan (COZAAR) 100 MG tablet TAKE 1 TABLET (100 MG TOTAL) BY MOUTH DAILY.  90 tablet  3  . omeprazole (PRILOSEC) 40 MG capsule TAKE 1 CAPSULE (40 MG TOTAL) BY MOUTH DAILY.  30 capsule  5  . phentermine (ADIPEX-P) 37.5 MG tablet Take 1 tablet (37.5 mg total) by mouth daily before breakfast.  30 tablet  0  . spironolactone  (ALDACTONE) 25 MG tablet TAKE 1 TABLET BY MOUTH DAILY.  30 tablet  3  . zolpidem (AMBIEN CR) 12.5 MG CR tablet TAKE 1 TABLET BY MOUTH AT BEDTIME AS NEEDED  30 tablet  4  . [DISCONTINUED] phentermine (ADIPEX-P) 37.5 MG tablet Take 1 tablet (37.5 mg total) by mouth daily before breakfast.  30 tablet  0  . ciprofloxacin (CIPRO) 500 MG tablet Take 1 tablet (500 mg total) by mouth 2 (two) times daily. For diarrhea or urinary tract infection  20 tablet  0  . CRYSELLE-28 0.3-30 MG-MCG tablet 1 TABLET, ORAL, DAILY  84 tablet  0  . levofloxacin (LEVAQUIN) 500 MG tablet Take 1 tablet (500 mg total) by mouth daily. For respiratory infection  7 tablet  0  . [DISCONTINUED] ciprofloxacin (CIPRO) 500 MG tablet Take 1 tablet (500 mg total) by mouth 2 (two) times daily. For diarrhea or urinary tract infection  10 tablet  0   No facility-administered encounter medications on file as of 11/27/2012.     Orders Placed This Encounter  Procedures  . TSH + free T4    No Follow-up on file.

## 2012-11-28 NOTE — Assessment & Plan Note (Signed)
She has lost 8 lbs since her last visit. Encouragement given. Thyroid may be overactive by recent TSH ,  Will repeat in 6 weeks.

## 2012-11-30 ENCOUNTER — Other Ambulatory Visit: Payer: Self-pay | Admitting: Internal Medicine

## 2012-12-10 ENCOUNTER — Other Ambulatory Visit: Payer: Self-pay | Admitting: Internal Medicine

## 2012-12-20 ENCOUNTER — Encounter: Payer: Self-pay | Admitting: Internal Medicine

## 2012-12-25 ENCOUNTER — Encounter: Payer: Self-pay | Admitting: *Deleted

## 2012-12-25 ENCOUNTER — Other Ambulatory Visit: Payer: Self-pay | Admitting: Internal Medicine

## 2012-12-25 ENCOUNTER — Ambulatory Visit: Payer: BC Managed Care – PPO | Admitting: *Deleted

## 2012-12-25 ENCOUNTER — Other Ambulatory Visit (INDEPENDENT_AMBULATORY_CARE_PROVIDER_SITE_OTHER): Payer: BC Managed Care – PPO

## 2012-12-25 VITALS — BP 120/80 | Wt 183.8 lb

## 2012-12-25 DIAGNOSIS — E0590 Thyrotoxicosis, unspecified without thyrotoxic crisis or storm: Secondary | ICD-10-CM

## 2012-12-25 DIAGNOSIS — E663 Overweight: Secondary | ICD-10-CM

## 2012-12-25 DIAGNOSIS — E669 Obesity, unspecified: Secondary | ICD-10-CM

## 2012-12-25 MED ORDER — PHENTERMINE HCL 37.5 MG PO TABS: 37.5000 mg | ORAL_TABLET | Freq: Every day | ORAL | Status: DC

## 2012-12-25 NOTE — Assessment & Plan Note (Signed)
tolerating phentermine,  Requesting refill

## 2012-12-25 NOTE — Progress Notes (Signed)
Patient here for refill on phentermine.  VS  Taken

## 2012-12-26 ENCOUNTER — Encounter: Payer: Self-pay | Admitting: Internal Medicine

## 2013-01-23 ENCOUNTER — Other Ambulatory Visit: Payer: Self-pay | Admitting: Internal Medicine

## 2013-02-19 ENCOUNTER — Encounter: Payer: Self-pay | Admitting: *Deleted

## 2013-02-20 ENCOUNTER — Ambulatory Visit (INDEPENDENT_AMBULATORY_CARE_PROVIDER_SITE_OTHER): Payer: BC Managed Care – PPO

## 2013-02-20 DIAGNOSIS — Z23 Encounter for immunization: Secondary | ICD-10-CM

## 2013-03-03 ENCOUNTER — Other Ambulatory Visit: Payer: Self-pay | Admitting: Internal Medicine

## 2013-03-04 ENCOUNTER — Telehealth: Payer: Self-pay | Admitting: *Deleted

## 2013-03-05 NOTE — Telephone Encounter (Signed)
error 

## 2013-03-06 ENCOUNTER — Other Ambulatory Visit: Payer: Self-pay | Admitting: *Deleted

## 2013-03-07 MED ORDER — ALPRAZOLAM 0.5 MG PO TABS: ORAL_TABLET | ORAL | Status: DC

## 2013-03-07 NOTE — Telephone Encounter (Signed)
VM left.  [facility] calling regarding status of alprazolam 0.5 mg 360 2 tab twice daily for sleep/anxiety prescription renewal.  Please call [phone] to renew.

## 2013-03-10 ENCOUNTER — Other Ambulatory Visit: Payer: Self-pay | Admitting: Internal Medicine

## 2013-03-10 NOTE — Telephone Encounter (Signed)
Script faxed.

## 2013-03-23 ENCOUNTER — Other Ambulatory Visit: Payer: Self-pay | Admitting: Internal Medicine

## 2013-03-24 NOTE — Telephone Encounter (Signed)
Pt notified that she will have to come to office to pick up Rx & sign controlled contract

## 2013-03-24 NOTE — Telephone Encounter (Signed)
Okay to refill? 

## 2013-03-26 ENCOUNTER — Telehealth: Payer: Self-pay | Admitting: *Deleted

## 2013-03-26 MED ORDER — PHENTERMINE HCL 37.5 MG PO TABS: 37.5000 mg | ORAL_TABLET | Freq: Every day | ORAL | Status: DC

## 2013-03-26 NOTE — Telephone Encounter (Signed)
Rx faxed to pharmacy, pt notified

## 2013-03-26 NOTE — Telephone Encounter (Signed)
Ok to refill,  printed rx  

## 2013-03-26 NOTE — Telephone Encounter (Signed)
Patient in today for labs while here ask if phentermine can be refilled? Advised patient I would forward refill and soon as possible.

## 2013-04-16 ENCOUNTER — Other Ambulatory Visit: Payer: Self-pay | Admitting: Internal Medicine

## 2013-04-16 ENCOUNTER — Encounter: Payer: Self-pay | Admitting: Internal Medicine

## 2013-04-29 ENCOUNTER — Ambulatory Visit: Payer: BC Managed Care – PPO | Admitting: Internal Medicine

## 2013-05-19 ENCOUNTER — Other Ambulatory Visit: Payer: Self-pay | Admitting: Internal Medicine

## 2013-06-04 ENCOUNTER — Ambulatory Visit (INDEPENDENT_AMBULATORY_CARE_PROVIDER_SITE_OTHER): Payer: BC Managed Care – PPO | Admitting: Internal Medicine

## 2013-06-04 ENCOUNTER — Encounter: Payer: Self-pay | Admitting: Internal Medicine

## 2013-06-04 VITALS — BP 130/85 | HR 79 | Temp 98.5°F | Resp 12 | Wt 189.0 lb

## 2013-06-04 DIAGNOSIS — L255 Unspecified contact dermatitis due to plants, except food: Secondary | ICD-10-CM

## 2013-06-04 DIAGNOSIS — L247 Irritant contact dermatitis due to plants, except food: Secondary | ICD-10-CM | POA: Insufficient documentation

## 2013-06-04 MED ORDER — PREDNISONE (PAK) 10 MG PO TABS: ORAL_TABLET | ORAL | Status: DC

## 2013-06-04 MED ORDER — HYDROXYZINE HCL 25 MG PO TABS: 25.0000 mg | ORAL_TABLET | Freq: Three times a day (TID) | ORAL | Status: DC | PRN

## 2013-06-04 MED ORDER — TRIAMCINOLONE ACETONIDE 0.5 % EX OINT: 1.0000 "application " | TOPICAL_OINTMENT | Freq: Two times a day (BID) | CUTANEOUS | Status: DC

## 2013-06-04 NOTE — Patient Instructions (Signed)
Apply the ointmewnt twice daily and keep area covered.  Use the hydrxyzine as needed for itching .  Prednisone: save for case of spreading rash

## 2013-06-04 NOTE — Progress Notes (Signed)
Pre visit review using our clinic review tool, if applicable. No additional management support is needed unless otherwise documented below in the visit note. 

## 2013-06-05 ENCOUNTER — Encounter: Payer: Self-pay | Admitting: Internal Medicine

## 2013-06-05 NOTE — Assessment & Plan Note (Signed)
Triamcinolone cream , apply bid.    rx given for prednisone in the event that the rash spreads.

## 2013-06-05 NOTE — Progress Notes (Signed)
Patient ID: Dawn Griffith, female   DOB: July 08, 1965, 48 y.o.   MRN: [id number]  Patient Active Problem List   Diagnosis Date Noted  . Contact dermatitis and eczema due to plant 06/04/2013  . Anemia 11/28/2012  . Overweight (BMI 25.0-29.9) 11/04/2012  . Abnormal pelvic exam 11/04/2012  . Routine general medical examination at a health care facility 11/03/2011  . Constipation 05/07/2011  . GERD (gastroesophageal reflux disease) 12/30/2010  . Anxiety 12/30/2010    Subjective:  CC:   Chief Complaint  Patient presents with  . Acute Visit    pt thinks she has poison oak .went outside 05/30/13 symptoms started yesterday. rash right  forearm . itchy / redness.     HPI:   Dawn Griffith a 48 y.o. female who presents with a pruritic rash on  Her right forearm.  The rash occurred after spending Christmas Day outside and in contact with presumed poison sumac. Has been using benadryl cream with no improvement.    Past Medical History  Diagnosis Date  . Cancer     as a child  . Hypertension     Past Surgical History  Procedure Laterality Date  . Cesarean section  1992  . Ectopic pregnancy surgery  1997  . Lymph node biopsy    . Nasal sinus surgery    . Colonoscopy    . Cholecystectomy  Dec 2012    Sankar       The following portions of the patient's history were reviewed and updated as appropriate: Allergies, current medications, and problem list.    Review of Systems:   12 Pt  review of systems was negative except those addressed in the HPI,     History   Social History  . Marital Status: Married    Spouse Name: N/A    Number of Children: N/A  . Years of Education: N/A   Occupational History  . Not on file.   Social History Main Topics  . Smoking status: Never Smoker   . Smokeless tobacco: Never Used  . Alcohol Use: No  . Drug Use: No  . Sexual Activity: Not on file   Other Topics Concern  . Not on file   Social History Narrative  . No narrative on  file    Objective:  Filed Vitals:   06/04/13 1131  BP: 130/85  Pulse: 79  Temp: 98.5 F (36.9 C)  Resp: 12     General appearance: alert, cooperative and appears stated age Lungs: clear to auscultation bilaterally Heart: regular rate and rhythm, S1, S2 normal, no murmur, click, rub or gallop Abdomen: soft, non-tender; bowel sounds normal; no masses,  no organomegaly Pulses: 2+ and symmetric Skin: Skin color, texture, turgor normal. No rashes or lesions Lymph nodes: Cervical, supraclavicular, and axillary nodes normal. Skin: salmon colored papular reash on right forearm.  No cellulitis  Assessment and Plan:  Contact dermatitis and eczema due to plant Triamcinolone cream , apply bid.    rx given for prednisone in the event that the rash spreads.    Updated Medication List Outpatient Encounter Prescriptions as of 06/04/2013  Medication Sig  . ALPRAZolam (XANAX) 0.5 MG tablet TAKE 2 TABLETS BY MOUTH TWICE A DAY AS NEEDED FOR SLEEP OR ANXIETY  . amitriptyline (ELAVIL) 100 MG tablet TAKE 1 TABLET (100 MG TOTAL) BY MOUTH AT BEDTIME.  . cetirizine (ZYRTEC) 10 MG tablet Take 10 mg by mouth daily.    . ciprofloxacin (CIPRO) 500 MG tablet Take 1  tablet (500 mg total) by mouth 2 (two) times daily. For diarrhea or urinary tract infection  . CRYSELLE-28 0.3-30 MG-MCG tablet 1 TABLET, ORAL, DAILY  . levofloxacin (LEVAQUIN) 500 MG tablet Take 1 tablet (500 mg total) by mouth daily. For respiratory infection  . losartan (COZAAR) 100 MG tablet TAKE 1 TABLET (100 MG TOTAL) BY MOUTH DAILY.  Marland Kitchen omeprazole (PRILOSEC) 40 MG capsule TAKE ONE CAPSULE BY MOUTH EVERY DAY  . phentermine (ADIPEX-P) 37.5 MG tablet Take 1 tablet (37.5 mg total) by mouth daily before breakfast.  . spironolactone (ALDACTONE) 25 MG tablet TAKE 1 TABLET BY MOUTH DAILY.  Marland Kitchen zolpidem (AMBIEN CR) 12.5 MG CR tablet TAKE 1 TABLET BY MOUTH AT BEDTIME AS NEEDED FOR SLEEP  . hydrOXYzine (ATARAX/VISTARIL) 25 MG tablet Take 1 tablet  (25 mg total) by mouth 3 (three) times daily as needed.  . predniSONE (STERAPRED UNI-PAK) 10 MG tablet 6 tablets on Day 1 , then reduce by 1 tablet daily until gone  . triamcinolone ointment (KENALOG) 0.5 % Apply 1 application topically 2 (two) times daily.     No orders of the defined types were placed in this encounter.    No Follow-up on file.

## 2013-07-07 ENCOUNTER — Other Ambulatory Visit: Payer: Self-pay | Admitting: Internal Medicine

## 2013-07-09 ENCOUNTER — Other Ambulatory Visit: Payer: Self-pay | Admitting: *Deleted

## 2013-07-10 MED ORDER — ALPRAZOLAM 0.5 MG PO TABS: ORAL_TABLET | ORAL | Status: DC

## 2013-07-10 NOTE — Telephone Encounter (Signed)
Last visit 06/04/13, ok refill?

## 2013-07-10 NOTE — Telephone Encounter (Signed)
Rx phoned to pharmacy.  

## 2013-08-21 ENCOUNTER — Other Ambulatory Visit: Payer: Self-pay | Admitting: Internal Medicine

## 2013-08-21 MED ORDER — LOSARTAN POTASSIUM 100 MG PO TABS
ORAL_TABLET | ORAL | Status: DC
Start: 2013-08-21 — End: 2014-02-18

## 2013-08-21 NOTE — Telephone Encounter (Signed)
Ok refill? 

## 2013-08-24 ENCOUNTER — Other Ambulatory Visit: Payer: Self-pay | Admitting: Internal Medicine

## 2013-09-24 ENCOUNTER — Other Ambulatory Visit: Payer: Self-pay | Admitting: Internal Medicine

## 2013-09-25 ENCOUNTER — Telehealth: Payer: Self-pay | Admitting: Internal Medicine

## 2013-09-25 NOTE — Telephone Encounter (Signed)
Patient Information:  Caller Name: Joaquina  Phone: [phone]  Patient: Dawn Griffith  Gender: Female  DOB: [birth year]  Age: 48 Years  PCP: Deborra Medina (Adults only)  Pregnant: No  Office Follow Up:  Does the office need to follow up with this patient?: Yes  Instructions For The Office: Pt requesting appt before she goes on a cruise 09/26/13 for spotting.  Can she be worked in?   Cell # [phone].   Symptoms  Reason For Call & Symptoms: PT has not had a full period since 05/2013.  Last week she had spotting and she is going on a cruise tomorrow, 34/24/15.   Is there anything she can take  To stop the bleeding?   Reviewed Health History In EMR: Yes  Reviewed Medications In EMR: Yes  Reviewed Allergies In EMR: Yes  Reviewed Surgeries / Procedures: Yes  Date of Onset of Symptoms: 09/25/2013 OB / GYN:  LMP: 09/25/2013  Guideline(s) Used:  Back Pain  Vaginal Bleeding - Abnormal  Disposition Per Guideline:   See Within 2 Weeks in Office  Reason For Disposition Reached:   Missed period has occurred 2 or more times in the last year and the cause is not known  Advice Given:  N/A  Patient Will Follow Care Advice:  YES

## 2013-09-25 NOTE — Telephone Encounter (Signed)
Unfortunately, I did not see this message until noon. I do not have any medication I can recommend without seeing patient to stop her periods, and I am out of the office for the rest of the day if one of the other providers can see her you can offer her an appt .

## 2013-09-25 NOTE — Telephone Encounter (Signed)
Pt notified no appointments available, pt is fine with that. States she will call first thing in the morning to see if there are any cancellations, is leaving [place] before 10:00 tomorrow morning.

## 2013-09-29 ENCOUNTER — Other Ambulatory Visit: Payer: Self-pay | Admitting: Internal Medicine

## 2013-11-05 ENCOUNTER — Encounter: Payer: Self-pay | Admitting: Internal Medicine

## 2013-11-05 ENCOUNTER — Other Ambulatory Visit (HOSPITAL_COMMUNITY)
Admission: RE | Admit: 2013-11-05 | Discharge: 2013-11-05 | Disposition: A | Discharge status: Home / Self Care | Payer: BC Managed Care – PPO | Source: Ambulatory Visit | Attending: Internal Medicine | Admitting: Internal Medicine

## 2013-11-05 ENCOUNTER — Ambulatory Visit (INDEPENDENT_AMBULATORY_CARE_PROVIDER_SITE_OTHER): Payer: BC Managed Care – PPO | Admitting: Internal Medicine

## 2013-11-05 VITALS — BP 106/78 | HR 97 | Temp 98.1°F | Resp 16 | Ht 69.5 in | Wt 189.5 lb

## 2013-11-05 DIAGNOSIS — I10 Essential (primary) hypertension: Secondary | ICD-10-CM

## 2013-11-05 DIAGNOSIS — Z01419 Encounter for gynecological examination (general) (routine) without abnormal findings: Secondary | ICD-10-CM | POA: Insufficient documentation

## 2013-11-05 DIAGNOSIS — F411 Generalized anxiety disorder: Secondary | ICD-10-CM

## 2013-11-05 DIAGNOSIS — R040 Epistaxis: Secondary | ICD-10-CM

## 2013-11-05 DIAGNOSIS — E785 Hyperlipidemia, unspecified: Secondary | ICD-10-CM

## 2013-11-05 DIAGNOSIS — E663 Overweight: Secondary | ICD-10-CM

## 2013-11-05 DIAGNOSIS — F419 Anxiety disorder, unspecified: Secondary | ICD-10-CM

## 2013-11-05 DIAGNOSIS — Z0000 Encounter for general adult medical examination without abnormal findings: Secondary | ICD-10-CM

## 2013-11-05 DIAGNOSIS — Z1151 Encounter for screening for human papillomavirus (HPV): Secondary | ICD-10-CM | POA: Insufficient documentation

## 2013-11-05 DIAGNOSIS — K219 Gastro-esophageal reflux disease without esophagitis: Secondary | ICD-10-CM

## 2013-11-05 DIAGNOSIS — R635 Abnormal weight gain: Secondary | ICD-10-CM

## 2013-11-05 DIAGNOSIS — Z124 Encounter for screening for malignant neoplasm of cervix: Secondary | ICD-10-CM

## 2013-11-05 LAB — CBC WITH DIFFERENTIAL/PLATELET
Basophils Absolute: 0 10*3/uL (ref 0.0–0.1)
Basophils Relative: 0.5 % (ref 0.0–3.0)
EOS ABS: 0.1 10*3/uL (ref 0.0–0.7)
Eosinophils Relative: 1.8 % (ref 0.0–5.0)
HCT: 39.5 % (ref 36.0–46.0)
HEMOGLOBIN: 12.8 g/dL (ref 12.0–15.0)
LYMPHS ABS: 2.1 10*3/uL (ref 0.7–4.0)
Lymphocytes Relative: 28.3 % (ref 12.0–46.0)
MCHC: 32.4 g/dL (ref 30.0–36.0)
MCV: 83.1 fl (ref 78.0–100.0)
MONO ABS: 0.5 10*3/uL (ref 0.1–1.0)
Monocytes Relative: 6.2 % (ref 3.0–12.0)
Neutro Abs: 4.7 10*3/uL (ref 1.4–7.7)
Neutrophils Relative %: 63.2 % (ref 43.0–77.0)
Platelets: 275 10*3/uL (ref 150.0–400.0)
RBC: 4.76 Mil/uL (ref 3.87–5.11)
RDW: 15.9 % — ABNORMAL HIGH (ref 11.5–15.5)
WBC: 7.4 10*3/uL (ref 4.0–10.5)

## 2013-11-05 LAB — COMPREHENSIVE METABOLIC PANEL
ALK PHOS: 69 U/L (ref 39–117)
ALT: 15 U/L (ref 0–35)
AST: 18 U/L (ref 0–37)
Albumin: 4.3 g/dL (ref 3.5–5.2)
BUN: 11 mg/dL (ref 6–23)
CO2: 26 mEq/L (ref 19–32)
Calcium: 9.4 mg/dL (ref 8.4–10.5)
Chloride: 105 mEq/L (ref 96–112)
Creatinine, Ser: 0.8 mg/dL (ref 0.4–1.2)
GFR: 79.56 mL/min (ref >60.00)
Glucose, Bld: 102 mg/dL — ABNORMAL HIGH (ref 70–99)
POTASSIUM: 4.5 meq/L (ref 3.5–5.1)
Sodium: 137 mEq/L (ref 135–145)
Total Bilirubin: 0.7 mg/dL (ref 0.2–1.2)
Total Protein: 7.3 g/dL (ref 6.0–8.3)

## 2013-11-05 LAB — LIPID PANEL
CHOLESTEROL: 212 mg/dL — AB (ref 0–200)
HDL: 44 mg/dL (ref >39.00)
LDL CALC: 149 mg/dL — AB (ref 0–99)
NONHDL: 168
Total CHOL/HDL Ratio: 5
Triglycerides: 94 mg/dL (ref 0.0–149.0)
VLDL: 18.8 mg/dL (ref 0.0–40.0)

## 2013-11-05 LAB — TSH: TSH: 0.74 u[IU]/mL (ref 0.35–4.50)

## 2013-11-05 MED ORDER — PHENTERMINE HCL 37.5 MG PO TABS: 37.5000 mg | ORAL_TABLET | Freq: Every day | ORAL | Status: DC

## 2013-11-05 MED ORDER — CIPROFLOXACIN HCL 500 MG PO TABS: 500.0000 mg | ORAL_TABLET | Freq: Two times a day (BID) | ORAL | Status: DC

## 2013-11-05 MED ORDER — PROMETHAZINE HCL 25 MG PO TABS: 25.0000 mg | ORAL_TABLET | Freq: Three times a day (TID) | ORAL | Status: DC | PRN

## 2013-11-05 MED ORDER — ZOLPIDEM TARTRATE ER 12.5 MG PO TBCR: EXTENDED_RELEASE_TABLET | ORAL | Status: DC

## 2013-11-05 MED ORDER — ALPRAZOLAM 0.5 MG PO TABS: ORAL_TABLET | ORAL | Status: DC

## 2013-11-05 MED ORDER — OMEPRAZOLE 40 MG PO CPDR: DELAYED_RELEASE_CAPSULE | ORAL | Status: DC

## 2013-11-05 NOTE — Progress Notes (Signed)
Pre-visit discussion using our clinic review tool. No additional management support is needed unless otherwise documented below in the visit note.  

## 2013-11-05 NOTE — Patient Instructions (Signed)
I have authorized the use of phentermine for 3 months.  Please  return to see me in 3 months.take 1`/2 tablet in the morning,  The second half by 2 PM to avoid insomnia If you have not lost 10 lbs  by the end of the  3 months we will have to discontinue it.

## 2013-11-05 NOTE — Progress Notes (Signed)
Patient ID: Dawn Griffith, female   DOB: 1965/09/04, 48 y.o.   MRN: [id number]    Subjective:     CLARENCE DUNSMORE is a 48 y.o. female and is here for a comprehensive physical exam. The patient reports as follows:.  Weight gain.  She was successful with losing weight using phentermine last year but has regained her weight due to non adherence with diet and exercise during her mother's prolonged illness.  He has begun an exercise program again is and is restricting her diet but has increased appetite.   She was treated by ENT on several occasions In February with endoscopy and cauterization for recurrent nosebleeds.   History   Social History  . Marital Status: Married    Spouse Name: N/A    Number of Children: N/A  . Years of Education: N/A   Occupational History  . Not on file.   Social History Main Topics  . Smoking status: Never Smoker   . Smokeless tobacco: Never Used  . Alcohol Use: No  . Drug Use: No  . Sexual Activity: Not on file   Other Topics Concern  . Not on file   Social History Narrative  . No narrative on file   Health Maintenance  Topic Date Due  . Tetanus/tdap  11/21/1982  . Influenza Vaccine  01/03/2014  . Pap Smear  11/05/2016    The following portions of the patient's history were reviewed and updated as appropriate: allergies, current medications, past family history, past medical history, past social history, past surgical history and problem list.  Review of Systems A comprehensive review of systems was negative.   Objective:   BP 106/78  Pulse 97  Temp(Src) 98.1 F (36.7 C) (Oral)  Resp 16  Ht 5' 9.5" (1.765 m)  Wt 189 lb 8 oz (85.957 kg)  BMI 27.59 kg/m2  SpO2 98%  LMP 10/13/2013  General Appearance:    Alert, cooperative, no distress, appears stated age  Head:    Normocephalic, without obvious abnormality, atraumatic  Eyes:    PERRL, conjunctiva/corneas clear, EOM's intact, fundi    benign, both eyes  Ears:    Normal TM's and  external ear canals, both ears  Nose:   Nares normal, septum midline, mucosa normal, no drainage    or sinus tenderness  Throat:   Lips, mucosa, and tongue normal; teeth and gums normal  Neck:   Supple, symmetrical, trachea midline, no adenopathy;    thyroid:  no enlargement/tenderness/nodules; no carotid   bruit or JVD  Back:     Symmetric, no curvature, ROM normal, no CVA tenderness  Lungs:     Clear to auscultation bilaterally, respirations unlabored  Chest Wall:    No tenderness or deformity   Heart:    Regular rate and rhythm, S1 and S2 normal, no murmur, rub   or gallop  Breast Exam:    No tenderness, masses, or nipple abnormality  Abdomen:     Soft, non-tender, bowel sounds active all four quadrants,    no masses, no organomegaly  Genitalia:    Pelvic: cervix normal in appearance, external genitalia normal, no adnexal masses or tenderness, no cervical motion tenderness, rectovaginal septum normal, uterus normal size, shape, and consistency and vagina normal without discharge  Extremities:   Extremities normal, atraumatic, no cyanosis or edema  Pulses:   2+ and symmetric all extremities  Skin:   Skin color, texture, turgor normal, no rashes or lesions  Lymph nodes:   Cervical, supraclavicular,  and axillary nodes normal  Neurologic:   CNII-XII intact, normal strength, sensation and reflexes    throughout    Assessment and plan:   Routine general medical examination at a health care facility Annual comprehensive exam was done including breast, pelvic and PAP smear. PAP was normal. All screenings have been addressed .   Other and unspecified hyperlipidemia New ACC guidelines recommend starting patients aged 64 or higher on moderate intensity statin therapy for LDL between 70-189 and 10 yr risk of CAD > 7.5% ;  Using the Framingham risk calculator,  her 10 year risk of coronary artery disease is 4.3% .  Lab Results  Component Value Date   CHOL 212* 11/05/2013   HDL 44.00 11/05/2013    LDLCALC 149* 11/05/2013   LDLDIRECT 173.9 04/06/2011   TRIG 94.0 11/05/2013   CHOLHDL 5 11/05/2013     Overweight (BMI 25.0-29.9) She has had difficulty losing weight due to increased appetite and is requesting a repeat  trial of  Phentermine.  She is aware of the possible side effects and risks and understands that    The medication will be discontinued if she has not lost 5% of her body weight over the next 3 months, which , based on today's weight is 10 lbs.  Essential hypertension, benign Well controlled on current regimen. Renal function stable, no changes today.  Lab Results  Component Value Date   NA 137 11/05/2013   K 4.5 11/05/2013   CL 105 11/05/2013   CO2 26 11/05/2013   Lab Results  Component Value Date   CREATININE 0.8 11/05/2013     GERD (gastroesophageal reflux disease) Asymptomatic with daily use of omeprazole 40 started in 2012.   Anxiety Managed with prn alprazolam,  And ambien/elavil for insomnia secondary to anxiety.  The risks and benefits of benzodiazepine use were reviewed with patient today including excessive sedation leading to respiratory depression,  impaired thinking/driving, and addiction.  Patient was advised to avoid concurrent use with alcohol, to use medication only as needed and not to share with others  .    Updated Medication List Outpatient Encounter Prescriptions as of 11/05/2013  Medication Sig  . ALPRAZolam (XANAX) 0.5 MG tablet TAKE 2 TABLETS BY MOUTH TWICE A DAY AS NEEDED FOR SLEEP OR ANXIETY  . amitriptyline (ELAVIL) 100 MG tablet TAKE 1 TABLET BY MOUTH AT BEDTIME  . cetirizine (ZYRTEC) 10 MG tablet Take 10 mg by mouth daily.    Marland Kitchen losartan (COZAAR) 100 MG tablet TAKE 1 TABLET (100 MG TOTAL) BY MOUTH DAILY.  Marland Kitchen omeprazole (PRILOSEC) 40 MG capsule TAKE ONE CAPSULE BY MOUTH EVERY DAY  . spironolactone (ALDACTONE) 25 MG tablet TAKE 1 TABLET BY MOUTH DAILY.  Marland Kitchen zolpidem (AMBIEN CR) 12.5 MG CR tablet TAKE 1 TABLET BY MOUTH AT BEDTIME AS NEEDED FOR SLEEP   . [DISCONTINUED] ALPRAZolam (XANAX) 0.5 MG tablet TAKE 2 TABLETS BY MOUTH TWICE A DAY AS NEEDED FOR SLEEP OR ANXIETY  . [DISCONTINUED] omeprazole (PRILOSEC) 40 MG capsule TAKE ONE CAPSULE BY MOUTH EVERY DAY  . [DISCONTINUED] zolpidem (AMBIEN CR) 12.5 MG CR tablet TAKE 1 TABLET BY MOUTH AT BEDTIME AS NEEDED FOR SLEEP  . ciprofloxacin (CIPRO) 500 MG tablet Take 1 tablet (500 mg total) by mouth 2 (two) times daily. For diarrhea or urinary tract infection  . CRYSELLE-28 0.3-30 MG-MCG tablet 1 TABLET, ORAL, DAILY  . hydrOXYzine (ATARAX/VISTARIL) 25 MG tablet Take 1 tablet (25 mg total) by mouth 3 (three) times daily as needed.  Marland Kitchen  levofloxacin (LEVAQUIN) 500 MG tablet Take 1 tablet (500 mg total) by mouth daily. For respiratory infection  . phentermine (ADIPEX-P) 37.5 MG tablet Take 1 tablet (37.5 mg total) by mouth daily before breakfast.  . predniSONE (STERAPRED UNI-PAK) 10 MG tablet 6 tablets on Day 1 , then reduce by 1 tablet daily until gone  . promethazine (PHENERGAN) 25 MG tablet Take 1 tablet (25 mg total) by mouth every 8 (eight) hours as needed for nausea or vomiting.  . triamcinolone ointment (KENALOG) 0.5 % Apply 1 application topically 2 (two) times daily.  . [DISCONTINUED] amitriptyline (ELAVIL) 100 MG tablet TAKE 1 TABLET BY MOUTH AT BEDTIME  . [DISCONTINUED] ciprofloxacin (CIPRO) 500 MG tablet Take 1 tablet (500 mg total) by mouth 2 (two) times daily. For diarrhea or urinary tract infection  . [DISCONTINUED] phentermine (ADIPEX-P) 37.5 MG tablet Take 1 tablet (37.5 mg total) by mouth daily before breakfast.

## 2013-11-05 NOTE — Assessment & Plan Note (Addendum)
Annual comprehensive exam was done including breast, pelvic and PAP smear. PAP was normal. All screenings have been addressed .

## 2013-11-06 LAB — CYTOLOGY - PAP

## 2013-11-07 ENCOUNTER — Encounter: Payer: Self-pay | Admitting: Internal Medicine

## 2013-11-07 DIAGNOSIS — I10 Essential (primary) hypertension: Secondary | ICD-10-CM | POA: Insufficient documentation

## 2013-11-07 DIAGNOSIS — E785 Hyperlipidemia, unspecified: Secondary | ICD-10-CM | POA: Insufficient documentation

## 2013-11-07 HISTORY — DX: Hyperlipidemia, unspecified: E78.5

## 2013-11-07 NOTE — Assessment & Plan Note (Signed)
Asymptomatic with daily use of omeprazole 40 started in 2012.

## 2013-11-07 NOTE — Assessment & Plan Note (Signed)
New ACC guidelines recommend starting patients aged 50 or higher on moderate intensity statin therapy for LDL between 70-189 and 10 yr risk of CAD > 7.5% ;  Using the Framingham risk calculator,  her 10 year risk of coronary artery disease is 4.3% .  Lab Results  Component Value Date   CHOL 212* 11/05/2013   HDL 44.00 11/05/2013   LDLCALC 149* 11/05/2013   LDLDIRECT 173.9 04/06/2011   TRIG 94.0 11/05/2013   CHOLHDL 5 11/05/2013

## 2013-11-07 NOTE — Assessment & Plan Note (Signed)
Well controlled on current regimen. Renal function stable, no changes today.  Lab Results  Component Value Date   NA 137 11/05/2013   K 4.5 11/05/2013   CL 105 11/05/2013   CO2 26 11/05/2013   Lab Results  Component Value Date   CREATININE 0.8 11/05/2013

## 2013-11-07 NOTE — Assessment & Plan Note (Signed)
She has had difficulty losing weight due to increased appetite and is requesting a repeat trial of  Phentermine.  She is aware of the possible side effects and risks and understands that    The medication will be discontinued if she has not lost 5% of her body weight over the next 3 months, which , based on today's weight is 10 lbs. 

## 2013-11-07 NOTE — Assessment & Plan Note (Addendum)
Managed with prn alprazolam,  And ambien/elavil for insomnia secondary to anxiety.  The risks and benefits of benzodiazepine use were reviewed with patient today including excessive sedation leading to respiratory depression,  impaired thinking/driving, and addiction.  Patient was advised to avoid concurrent use with alcohol, to use medication only as needed and not to share with others   

## 2013-11-10 LAB — HM MAMMOGRAPHY: HM MAMMO: NEGATIVE

## 2013-11-26 ENCOUNTER — Encounter: Payer: Self-pay | Admitting: *Deleted

## 2013-11-26 DIAGNOSIS — E040 Nontoxic diffuse goiter: Secondary | ICD-10-CM

## 2013-11-30 ENCOUNTER — Other Ambulatory Visit: Payer: Self-pay | Admitting: Internal Medicine

## 2013-12-02 ENCOUNTER — Encounter: Payer: Self-pay | Admitting: Internal Medicine

## 2013-12-31 ENCOUNTER — Other Ambulatory Visit: Payer: Self-pay | Admitting: Internal Medicine

## 2014-01-30 ENCOUNTER — Other Ambulatory Visit: Payer: Self-pay | Admitting: Internal Medicine

## 2014-02-18 ENCOUNTER — Other Ambulatory Visit: Payer: Self-pay | Admitting: *Deleted

## 2014-02-18 MED ORDER — LOSARTAN POTASSIUM 100 MG PO TABS: ORAL_TABLET | ORAL | Status: DC

## 2014-02-27 LAB — TSH: TSH: 0.56 u[IU]/mL (ref 0.41–5.90)

## 2014-02-28 ENCOUNTER — Ambulatory Visit (INDEPENDENT_AMBULATORY_CARE_PROVIDER_SITE_OTHER): Payer: BC Managed Care – PPO

## 2014-02-28 DIAGNOSIS — Z23 Encounter for immunization: Secondary | ICD-10-CM

## 2014-03-02 ENCOUNTER — Telehealth: Payer: Self-pay

## 2014-03-02 NOTE — Telephone Encounter (Signed)
Yes, follow up thyroid ultrasound has been ordered .  Helen please see phone number below for notification

## 2014-03-02 NOTE — Telephone Encounter (Signed)
The patient called and is hoping to get a thyroid ultrasound done as a follow up.  Do you want this ordered? Thanks!  Callback- [phone]

## 2014-03-03 NOTE — Telephone Encounter (Signed)
Called patient and advised patient as soon as appointment made for [place] office will be in touch.

## 2014-03-05 ENCOUNTER — Telehealth: Payer: Self-pay | Admitting: Internal Medicine

## 2014-03-05 ENCOUNTER — Ambulatory Visit: Payer: Self-pay | Admitting: Internal Medicine

## 2014-03-05 DIAGNOSIS — E049 Nontoxic goiter, unspecified: Secondary | ICD-10-CM

## 2014-03-05 DIAGNOSIS — E040 Nontoxic diffuse goiter: Secondary | ICD-10-CM

## 2014-03-05 HISTORY — DX: Nontoxic goiter, unspecified: E04.9

## 2014-03-05 HISTORY — DX: Nontoxic diffuse goiter: E04.0

## 2014-03-05 NOTE — Telephone Encounter (Signed)
Repeat ultrasound of thyroid is unchanged.

## 2014-03-06 NOTE — Telephone Encounter (Signed)
Sent my chart with results. 

## 2014-03-06 NOTE — Telephone Encounter (Signed)
Called [facility] to schedule   Was advised the patient had this done yesterday at [facility] [place].  This was not documented in the referral but the referral was approved

## 2014-03-16 ENCOUNTER — Encounter: Payer: Self-pay | Admitting: Internal Medicine

## 2014-03-16 ENCOUNTER — Ambulatory Visit (INDEPENDENT_AMBULATORY_CARE_PROVIDER_SITE_OTHER): Payer: BC Managed Care – PPO | Admitting: Internal Medicine

## 2014-03-16 VITALS — BP 124/80 | HR 97 | Temp 99.4°F | Resp 16 | Ht 69.5 in | Wt 191.2 lb

## 2014-03-16 DIAGNOSIS — E663 Overweight: Secondary | ICD-10-CM

## 2014-03-16 DIAGNOSIS — E049 Nontoxic goiter, unspecified: Secondary | ICD-10-CM

## 2014-03-16 DIAGNOSIS — E040 Nontoxic diffuse goiter: Secondary | ICD-10-CM

## 2014-03-16 MED ORDER — PHENTERMINE HCL 37.5 MG PO TABS: 37.5000 mg | ORAL_TABLET | Freq: Every day | ORAL | Status: DC

## 2014-03-16 NOTE — Progress Notes (Signed)
Pre-visit discussion using our clinic review tool. No additional management support is needed unless otherwise documented below in the visit note.  

## 2014-03-16 NOTE — Progress Notes (Signed)
Patient ID: Dawn Griffith, female   DOB: 1966-05-26, 48 y.o.   MRN: [id number]   Patient Active Problem List   Diagnosis Date Noted  . Goiter diffuse 03/05/2014  . Other and unspecified hyperlipidemia 11/07/2013  . Essential hypertension, benign 11/07/2013  . Anemia 11/28/2012  . Overweight (BMI 25.0-29.9) 11/04/2012  . Routine general medical examination at a health care facility 11/03/2011  . Constipation 05/07/2011  . GERD (gastroesophageal reflux disease) 12/30/2010  . Anxiety 12/30/2010    Subjective:  CC:   Chief Complaint  Patient presents with  . Follow-up    labs    HPI:   Dawn Griffith is a 48 y.o. female who presents for follow up on several issues:   1) goiter.  Patient has had repeat thyroid ultrasound for follow up on diffuse goiter, unchanged from last year, but Nadeen Landau, MD [facility] ENT ordered a thyroid panel and referred her to me for interpretation.  TSH was  0.56,  Free T4 and T3 are both low end of normal.   2) inability to lose weight , unchanged despite exercising daily, taking phentermine and eating a modified diet. Diet reviewed in detail. In summary:   Eating quick oatmeal every morning,  Lunch is soup and salad,  And dinner is often a breaded meat/fish and green vegetable.  No sodas or sweet tea, rare alcohol.  Denies snacking during the day.      Past Medical History  Diagnosis Date  . Cancer     as a child  . Hypertension     Past Surgical History  Procedure Laterality Date  . Cesarean section  1992  . Ectopic pregnancy surgery  1997  . Lymph node biopsy    . Nasal sinus surgery    . Colonoscopy    . Cholecystectomy  Dec 2012    Sankar       The following portions of the patient's history were reviewed and updated as appropriate: Allergies, current medications, and problem list.    Review of Systems:   Patient denies headache, fevers, malaise, unintentional weight loss, skin rash, eye pain, sinus congestion and sinus pain,  sore throat, dysphagia,  hemoptysis , cough, dyspnea, wheezing, chest pain, palpitations, orthopnea, edema, abdominal pain, nausea, melena, diarrhea, constipation, flank pain, dysuria, hematuria, urinary  Frequency, nocturia, numbness, tingling, seizures,  Focal weakness, Loss of consciousness,  Tremor, insomnia, depression, anxiety, and suicidal ideation.     History   Social History  . Marital Status: Married    Spouse Name: N/A    Number of Children: N/A  . Years of Education: N/A   Occupational History  . Not on file.   Social History Main Topics  . Smoking status: Never Smoker   . Smokeless tobacco: Never Used  . Alcohol Use: No  . Drug Use: No  . Sexual Activity: Not on file   Other Topics Concern  . Not on file   Social History Narrative  . No narrative on file    Objective:  Filed Vitals:   03/16/14 1332  BP: 124/80  Pulse: 97  Temp: 99.4 F (37.4 C)  Resp: 16     General appearance: alert, cooperative and appears stated age Ears: normal TM's and external ear canals both ears Throat: lips, mucosa, and tongue normal; teeth and gums normal Neck: no adenopathy, no carotid bruit, supple, symmetrical, trachea midline and thyroid not enlarged, symmetric, no tenderness/mass/nodules Back: symmetric, no curvature. ROM normal. No CVA tenderness. Lungs: clear to  auscultation bilaterally Heart: regular rate and rhythm, S1, S2 normal, no murmur, click, rub or gallop Abdomen: soft, non-tender; bowel sounds normal; no masses,  no organomegaly Pulses: 2+ and symmetric Skin: Skin color, texture, turgor normal. No rashes or lesions Lymph nodes: Cervical, supraclavicular, and axillary nodes normal.  Assessment and Plan:  Goiter diffuse Suggested by U/s in 2013 by heterogenous appearance with no nodules or masses.  (copy in chart).  Repeat ultrasound done Sept 29 at [facility] is essentially unchanged  (the report is not available bc it needs to be scanned into chart which  unfortunately takes nearly a month ) but her TSH, T4 and T3 were done recently by Nadeen Landau, MD [facility], ENT) and  Are as follows:  TSH   0.56 (0.6 - 3.3 ng/mL))  T3 Total 1.2 (1.0 to 1.7 mg/mL))  Free T4  0.88  (0.71 to 1.4 ng/dL)  I have opined that althought the TSH is slightly suppressed,  Her T3 and T4 values are normal, although borderline, and would not merit treatment or further evaluation of the HPA axis  , but would be happy to have our endocrinologist, Dr. Karolee Stamps review them with her in consultation.  Referral in process.   Overweight (BMI 25.0-29.9) She reached a nadir of 183 lbs but has gained wt despite using phentermine and exercising, and is concerned that her thyroid is underactive.  Will have Dr. Karolee Stamps address thyroid concerns.  Diet addressed and patient advised to do a carb count and consider low GI diet avoiding oatmeal in favor or high protein breakfast,  Avoiding restaurant soups that are often loaded with hidden carbs ,  And avoiding breaded meats/fish.   A total of 25 minutes of face to face time was spent with patient more than half of which was spent in counselling and coordination of care   Updated Medication List Outpatient Encounter Prescriptions as of 03/16/2014  Medication Sig  . ALPRAZolam (XANAX) 0.5 MG tablet TAKE 2 TABLETS BY MOUTH TWICE A DAY AS NEEDED FOR SLEEP OR ANXIETY  . amitriptyline (ELAVIL) 100 MG tablet TAKE 1 TABLET BY MOUTH AT BEDTIME  . ascorbic acid (VITAMIN C) 250 MG CHEW Chew 250 mg by mouth daily.  . cetirizine (ZYRTEC) 10 MG tablet Take 10 mg by mouth daily.    Marland Kitchen losartan (COZAAR) 100 MG tablet TAKE 1 TABLET (100 MG TOTAL) BY MOUTH DAILY.  . Multiple Vitamin (MULTIVITAMIN) capsule Take 1 capsule by mouth daily.  Marland Kitchen omeprazole (PRILOSEC) 40 MG capsule TAKE ONE CAPSULE BY MOUTH EVERY DAY  . phentermine (ADIPEX-P) 37.5 MG tablet Take 1 tablet (37.5 mg total) by mouth daily before breakfast.  . spironolactone (ALDACTONE) 25 MG tablet TAKE 1  TABLET BY MOUTH EVERY DAY  . vitamin B-12 (CYANOCOBALAMIN) 1000 MCG tablet Take 1,000 mcg by mouth daily.  Marland Kitchen zolpidem (AMBIEN CR) 12.5 MG CR tablet TAKE 1 TABLET BY MOUTH AT BEDTIME AS NEEDED FOR SLEEP  . [DISCONTINUED] phentermine (ADIPEX-P) 37.5 MG tablet Take 1 tablet (37.5 mg total) by mouth daily before breakfast.  . CRYSELLE-28 0.3-30 MG-MCG tablet 1 TABLET, ORAL, DAILY  . hydrOXYzine (ATARAX/VISTARIL) 25 MG tablet Take 1 tablet (25 mg total) by mouth 3 (three) times daily as needed.  . [DISCONTINUED] ciprofloxacin (CIPRO) 500 MG tablet Take 1 tablet (500 mg total) by mouth 2 (two) times daily. For diarrhea or urinary tract infection  . [DISCONTINUED] levofloxacin (LEVAQUIN) 500 MG tablet Take 1 tablet (500 mg total) by mouth daily. For respiratory infection  . [  DISCONTINUED] predniSONE (STERAPRED UNI-PAK) 10 MG tablet 6 tablets on Day 1 , then reduce by 1 tablet daily until gone  . [DISCONTINUED] promethazine (PHENERGAN) 25 MG tablet Take 1 tablet (25 mg total) by mouth every 8 (eight) hours as needed for nausea or vomiting.  . [DISCONTINUED] spironolactone (ALDACTONE) 25 MG tablet TAKE 1 TABLET BY MOUTH EVERY DAY  . [DISCONTINUED] triamcinolone ointment (KENALOG) 0.5 % Apply 1 application topically 2 (two) times daily.     Orders Placed This Encounter  Procedures  . TSH  . Ambulatory referral to Endocrinology    Return in about 3 months (around 06/16/2014).

## 2014-03-16 NOTE — Assessment & Plan Note (Addendum)
Suggested by U/s in 2013 by heterogenous appearance with no nodules or masses.  (copy in chart).  Repeat ultrasound done Sept 29 at [facility] is essentially unchanged  (the report is not available bc it needs to be scanned into chart which unfortunatlely takes nearly a month ) but her TSH, T4 and T3 were done recently by Nadeen Landau, MD [facility], ENT) and  Are as follows:  TSH   0.56 (0.6 - 3.3 ng/mL))  T3 Total 1.2 (1.0 to 1.7 mg/mL))  Free T4  0.88  (0.71 to 1.4 ng/dL)  I have opined that althought the TSH is slightly suppressed,  Her T3 and T4 values are normal, although borderline, and would not merit treatment , but would ve happy to have our endocrinologist, Dr. Karolee Stamps review them with her in consultation.  Referral in process.

## 2014-03-16 NOTE — Patient Instructions (Signed)
continue the phentermine for 3 more months   This is  my version of a  "Low GI"  Diet:  It will still lower your blood sugars and allow you to lose 4 to 8  lbs  per month if you follow it carefully.  Your goal with exercise is a minimum of 30 minutes of aerobic exercise 5 days per week (Walking does not count once it becomes easy!)     All of the foods can be found at grocery stores and in bulk at [employer].  The Atkins protein bars and shakes are available in more varieties at [employer], [employer] and [employer].     7 AM Breakfast:  Choose from the following:  Low carbohydrate Protein  Shakes (I recommend the EAS AdvantEdge "Carb Control" shakes  Or the low carb shakes by Atkins.    2.5 carbs   Arnold's "Sandwhich Thin"toasted  w/ peanut butter (no jelly: about 20 net carbs  "Bagel Thin" with cream cheese and salmon: about 20 carbs   a scrambled egg/bacon/cheese burrito made with Mission's "carb balance" whole wheat tortilla  (about 10 net carbs )  A slice of home made fritatta (egg based dish without a crust:  google it)    Avoid cereal and bananas, oatmeal and cream of wheat and grits. They are loaded with carbohydrates!   10 AM: high protein snack  Protein bar by Atkins (the snack size, under 200 cal, usually < 6 net carbs).    A stick of cheese:  Around 1 carb,  100 cal     Dannon Light n Fit [place] Yogurt  (80 cal, 8 carbs)  Other so called "protein bars" and Greek yogurts tend to be loaded with carbohydrates.  Remember, in food advertising, the word "energy" is synonymous for " carbohydrate."  Lunch:   A Sandwich using the bread choices listed, Can use any  Eggs,  lunchmeat, grilled meat or canned tuna), avocado, regular mayo/mustard  and cheese.  A Salad using blue cheese, ranch,  Goddess or vinagrette,  No croutons or "confetti" and no "candied nuts" but regular nuts OK.   No pretzels or chips.  Pickles and miniature sweet peppers are a good low carb alternative that provide a  "crunch"  The bread is the only source of carbohydrate in a sandwich and  can be decreased by trying some of these alternatives to traditional loaf bread  Joseph's makes a pita bread and a flat bread that are 50 cal and 4 net carbs available at [place] and [place].  This can be toasted to use with hummous as well  Toufayan makes a low carb flatbread that's 100 cal and 9 net carbs available at [employer] and [employer]'s makes 2 sizes of  Low carb whole wheat tortilla  (The large one is 210 cal and 6 net carbs)  Flat Out makes flatbreads that are low carb as well  Avoid "Low fat dressings, as well as Barry Brunner and [place] dressings They are loaded with sugar!   3 PM/ Mid day  Snack:  Consider  1 ounce of  almonds, walnuts, pistachios, pecans, peanuts,  Macadamia nuts or a nut medley.  Avoid "granola"; the dried cranberries and raisins are loaded with carbohydrates. Mixed nuts as long as there are no raisins,  cranberries or dried fruit.    Try the prosciutto/mozzarella cheese sticks by Fiorruci  In deli /backery section   High protein   To avoid overindulging in snacks:  Try drinking a glass of unsweeted almond/coconut milk  Or a cup of coffee with your Atkins chocolate bar to keep you from having 3!!!   Pork rinds!  Yes Pork Rinds        6 PM  Dinner:     Meat/fowl/fish with a green salad, and either broccoli, cauliflower, green beans, spinach, brussel sprouts or  Lima beans. DO NOT BREAD THE PROTEIN!!      There is a low carb pasta by Dreamfield's that is acceptable and tastes great: only 5 digestible carbs/serving.( All grocery stores but BJs carry it )  Try Hurley Cisco Angelo's chicken piccata or chicken or eggplant parm over low carb pasta.(Lowes and BJs)   Marjory Lies Sanchez's "Carnitas" (pulled pork, no sauce,  0 carbs) or his beef pot roast to make a dinner burrito (at BJ's)  Pesto over low carb pasta (bj's sells a good quality pesto in the center refrigerated section of the deli   Try  satueeing  Cheral Marker with mushroooms  Whole wheat pasta is still full of digestible carbs and  Not as low in glycemic index as Dreamfield's.   Brown rice is still rice,  So skip the rice and noodles if you eat [place] or [place] (or at least limit to 1/2 cup)  9 PM snack :   Breyer's "low carb" fudgsicle or  ice cream bar (Carb Smart line), or  Weight Watcher's ice cream bar , or another "no sugar added" ice cream;  a serving of fresh berries/cherries with whipped cream   Cheese or DANNON'S LlGHT N FIT GREEK YOGURT or the Oikos greek yogurt   8 ounces of [place] unsweetened almond/cococunut milk    Avoid bananas, pineapple, grapes  and watermelon on a regular basis because they are high in sugar.  THINK OF THEM AS DESSERT  Remember that snack Substitutions should be less than 10 NET carbs per serving and meals < 20 carbs. Remember to subtract fiber grams to get the "net carbs."

## 2014-03-17 NOTE — Assessment & Plan Note (Signed)
She reached a nadir of 183 lbs but has gained wt despite using phentermine and exercising, Diet addressed and patient advised to do a carb count and consider low GI diet avoiding oatmeal in favor or high protein breakfast,  Avoiding restaurant soups that are often loaded with hidden carbs ,  And avoiding breaded meats/fish.

## 2014-03-18 ENCOUNTER — Ambulatory Visit (INDEPENDENT_AMBULATORY_CARE_PROVIDER_SITE_OTHER): Payer: BC Managed Care – PPO | Admitting: Endocrinology

## 2014-03-18 ENCOUNTER — Encounter: Payer: Self-pay | Admitting: Endocrinology

## 2014-03-18 VITALS — BP 118/78 | HR 100 | Resp 14 | Ht 69.5 in | Wt 189.5 lb

## 2014-03-18 DIAGNOSIS — E040 Nontoxic diffuse goiter: Secondary | ICD-10-CM

## 2014-03-18 DIAGNOSIS — E049 Nontoxic goiter, unspecified: Secondary | ICD-10-CM

## 2014-03-18 NOTE — Patient Instructions (Signed)
Recheck thyroid panel in jan appt with Dr Derrel Nip.  Report back promptly if you start to notice- diarrhea, tremors, palpitations, feeling hot excessively, unintentional weight loss. Report back if start to notice further changes in thyroid area.   RTC 1 year.

## 2014-03-18 NOTE — Progress Notes (Signed)
Pre visit review using our clinic review tool, if applicable. No additional management support is needed unless otherwise documented below in the visit note. 

## 2014-03-18 NOTE — Assessment & Plan Note (Addendum)
I have reviewed her thyroid images from the thyroid US independently. Overall, there is no distinct nodule, and would recommend that we would proceed with observation at this stage as she is not symptomatic with compressive symptoms. However, based on her last few thyroid labs, she is at risk for hyperthyroidism probably due to nodular autonomy and I have recommended that she monitor for any symptoms of hyperthyroidism. She has an appointment in January with Dr Derrel Nip, and her complete thyroid panel could be rechecked at that time. If she is showing more evidence of progression to hyperthyroid range, then could consider a diagnostic nuclear scan for further determination of hyperfunctioning areas.  Donot recommend thyroid hormone suppression at this time, given recent guidelines not recommending this therapy. If she starts to have compressive symptoms, then could consider surgery.  RTC 1 year.

## 2014-03-18 NOTE — Progress Notes (Signed)
Reason for  Visit-  Dawn Griffith is a 48 y.o.-year-old female, referred by her PCP, Crecencio Mc, MD, for evaluation for a goiter.  HPI-  The patient reports being diagnosed with a goiter since several years. It was checked in 2013 with thyroid US.  her thyroid ultrasound was done  Sept 29, 2015 at [facility]. It showed a heterogenous thyroid gland without any distinct nodules and small normal appearing lymph nodes inferior to thyroid ( right thyroid lobe 50 x 16 x 18 mm, left thyroid lobe 50 x 16 x 18 mm, isthmus 3 mm , diffuse heterogeneity throughout without distinct nodules). On my review, there appears to be developing nodularity in right superior pole.   Prior Thyroid US results from 2013 are summarized below, but most current thyroid US appears unchanged from prior mostly.   she does not have a family history of thyroid disorders. The patient denies any family history of thyroid cancer or personal history of XRT to her neck area.    The patient does have a history of hypothyroidism. Currently not on thyroid hormone. She is perimenopausal. She had a few abnormal low TSH levels in the preceding years, but most recent TSH is at low end of normal.   I reviewed patient's thyroid tests: Lab Results  Component Value Date   TSH 0.56 02/27/2014   TSH 0.74 11/05/2013   TSH 0.094* 12/25/2012   TSH 0.20* 11/04/2012   TSH 0.69 11/01/2011   TSH 0.75 05/05/2011   FREET4 1.18 12/25/2012    Labs were done Sept 2015 by Nadeen Landau, MD [facility], ENT) and Are as follows:  TSH 0.56 (0.6 - 3.3 ng/mL))  T3 Total 1.2 (1.0 to 1.7 mg/mL))  Free T4 0.88 (0.71 to 1.4 ng/dL)  Thyroid US done 08/2011 at [facility]: right thyroid lobe 46 x 16 x 17 mm, left thyroid lobe 46 x 13 x 56mm. Isthmus 3.6 mm. Heterogenous architecture without distinct nodules.   Patient denies feeling nodules in neck, hoarseness, dysphagia/odynophagia, SOB with lying down.  She denies any specific new symptoms of hypo- or hyperthyroidism as detailed  below. She is having difficulty with weight loss, and has been on phentermine since Sept 2015 and has been following lifestyle efforts only recently.      I have reviewed the patient's past medical history, family and social history, surgical history, medications and allergies.  Past Medical History  Diagnosis Date  . Cancer     as a child  . Hypertension    Past Surgical History  Procedure Laterality Date  . Cesarean section  1992  . Ectopic pregnancy surgery  1997  . Lymph node biopsy    . Nasal sinus surgery    . Colonoscopy    . Cholecystectomy  Dec 2012    Sankar   History   Social History  . Marital Status: Married    Spouse Name: N/A    Number of Children: N/A  . Years of Education: N/A   Occupational History  . Not on file.   Social History Main Topics  . Smoking status: Never Smoker   . Smokeless tobacco: Never Used  . Alcohol Use: No  . Drug Use: No  . Sexual Activity: Not on file   Other Topics Concern  . Not on file   Social History Narrative  . No narrative on file   Current Outpatient Prescriptions on File Prior to Visit  Medication Sig Dispense Refill  . amitriptyline (ELAVIL) 100 MG tablet TAKE  1 TABLET BY MOUTH AT BEDTIME  30 tablet  2  . ascorbic acid (VITAMIN C) 250 MG CHEW Chew 250 mg by mouth daily.      . cetirizine (ZYRTEC) 10 MG tablet Take 10 mg by mouth daily.        Marland Kitchen losartan (COZAAR) 100 MG tablet TAKE 1 TABLET (100 MG TOTAL) BY MOUTH DAILY.  90 tablet  1  . Multiple Vitamin (MULTIVITAMIN) capsule Take 1 capsule by mouth daily.      Marland Kitchen omeprazole (PRILOSEC) 40 MG capsule TAKE ONE CAPSULE BY MOUTH EVERY DAY  30 capsule  5  . phentermine (ADIPEX-P) 37.5 MG tablet Take 1 tablet (37.5 mg total) by mouth daily before breakfast.  30 tablet  2  . spironolactone (ALDACTONE) 25 MG tablet TAKE 1 TABLET BY MOUTH EVERY DAY  30 tablet  1  . vitamin B-12 (CYANOCOBALAMIN) 1000 MCG tablet Take 1,000 mcg by mouth daily.      Marland Kitchen zolpidem (AMBIEN CR)  12.5 MG CR tablet TAKE 1 TABLET BY MOUTH AT BEDTIME AS NEEDED FOR SLEEP  30 tablet  4   No current facility-administered medications on file prior to visit.   No Known Allergies Family History  Problem Relation Age of Onset  . Arrhythmia Mother   . Hypertension Mother   . Hypertension Father   . Diabetes Father   . Hypertension Brother   .        Review of Systems: [x]  complains of  [  ] denies General:   [ x ] Recent weight change [  ] Fatigue  [  ] Loss of appetite Eyes: [  ]  Vision Difficulty [  ]  Eye pain ENT: [  ]  Hearing difficulty [  ]  Difficulty Swallowing CVS: [  ] Chest pain [  ]  Palpitations/Irregular Heart beat [  ]  Shortness of breath lying flat [  ] Swelling of legs Resp: [  ] Frequent Cough [  ] Shortness of Breath  [  ]  Wheezing GI: [  ] Heartburn  [  ] Nausea or Vomiting  [  ] Diarrhea [x  ] Constipation (chronic)  [  ] Abdominal Pain GU: [  ]  Polyuria  [  ]  nocturia Bones/joints:  [  ]  Muscle aches  [  ] Joint Pain  [  ] Bone pain Skin/Hair/Nails: [  ]  Rash  [  ] New stretch marks [  ]  Itching [  ] Hair loss [  ]  Excessive hair growth Reproduction: [  ] Low sexual desire , [ x ]  Women: Menstrual cycle problems [  ]  Women: Breast Discharge [  ] Men: Difficulty with erections [  ]  Men: Enlarged Breasts CNS: [  ] Frequent Headaches [  ] Blurry vision [  ] Tremors [  ] Seizures [  ] Loss of consciousness [  ] Localized weakness Endocrine: [  ]  Excess thirst [ x ]  Feeling excessively hot, HEENT area [  ]  Feeling excessively cold Heme: [  ]  Easy bruising [  ]  Enlarged glands or lumps in neck Allergy: [  ]  Food allergies [  ] Environmental allergies  Physical Exam- BP 118/78  Pulse 100  Resp 14  Ht 5' 9.5" (1.765 m)  Wt 189 lb 8 oz (85.957 kg)  BMI 27.59 kg/m2  SpO2 97%  LMP 01/01/2014 Wt Readings from Last  3 Encounters:  03/18/14 189 lb 8 oz (85.957 kg)  03/16/14 191 lb 4 oz (86.75 kg)  11/05/13 189 lb 8 oz (85.957 kg)    HEENT: [place]/AT,  EOMI, no icterus, no proptosis, no chemosis, no mild lid lag, no retraction, eyes close completely Neck: thyroid gland - smooth, mild enlargement, without distinct nodule, non-tender, no erythema, no tracheal deviation; negative Pemberton's sign; no lympahadenopathy Lungs: good air entry, clear bilaterally Heart: S1&S2 normal, regular rate & rhythm; no murmurs, rubs or gallops Abd: soft, NT, ND, no HSM, +BS Ext: mild tremor in hands bilaterally, no edema, 2+ DP/PT pulses, good muscle mass Neuro: normal gait, 2+ reflexes bilaterally, normal 5/5 strength, no proximal myopathy  Derm: no pretibial myxoedema/skin dryness  ASSESSMENT- 1. Goiter without dominant nodules or masses and without compressive symptoms in setting of low normal TSH Independently reviewed thyroid US from 2013 and 2015, findings summarized in HPI.   PLAN: Problem List Items Addressed This Visit     Endocrine   Goiter diffuse - Primary     I have reviewed her thyroid images from the thyroid US independently. Overall, there is no distinct nodule, and would recommend that we would proceed with observation at this stage as she is not symptomatic with compressive symptoms. However, based on her last few thyroid labs, she is at risk for hyperthyroidism probably due to nodular autonomy and I have recommended that she monitor for any symptoms of hyperthyroidism. She has an appointment in January with Dr Derrel Nip, and her complete thyroid panel could be rechecked at that time. If she is showing more evidence of progression to hyperthyroid range, then could consider a diagnostic nuclear scan for further determination of hyperfunctioning areas.  Donot recommend thyroid hormone suppression at this time, given recent guidelines not recommending this therapy. If she starts to have compressive symptoms, then could consider surgery.  RTC 1 year.       Iasia [facility] 03/19/2014 4:52 PM

## 2014-03-19 ENCOUNTER — Telehealth: Payer: Self-pay | Admitting: *Deleted

## 2014-03-19 ENCOUNTER — Other Ambulatory Visit: Payer: Self-pay | Admitting: *Deleted

## 2014-03-19 ENCOUNTER — Telehealth: Payer: Self-pay | Admitting: Internal Medicine

## 2014-03-19 MED ORDER — ALPRAZOLAM 0.5 MG PO TABS: ORAL_TABLET | ORAL | Status: DC

## 2014-03-19 NOTE — Telephone Encounter (Signed)
Last ov was Oct 12th ,  Ok to refill

## 2014-03-19 NOTE — Telephone Encounter (Signed)
Fax from pharmacy requesting Alprazolam refill.  Last OV 6.3.15, last refill 9.3.15.  Please advise refill.

## 2014-03-19 NOTE — Telephone Encounter (Signed)
Rx called in 

## 2014-03-19 NOTE — Telephone Encounter (Signed)
Contacted patient to come pick up her C.D's of her Thyroid. They are located in the patient pick up folder at the front desk.

## 2014-03-20 ENCOUNTER — Encounter: Payer: Self-pay | Admitting: *Deleted

## 2014-03-20 NOTE — Telephone Encounter (Signed)
Sent mychart

## 2014-03-20 NOTE — Telephone Encounter (Signed)
Message copied by Cecelia Byars on Fri Mar 20, 2014  8:27 AM ------      Message from: Bynum Bellows P      Created: Thu Mar 19, 2014  4:55 PM      Regarding: notify patient please       Please let her know that I have reviewed her thyroid US images and there could be a developing nodule on the right thyroid. Will continue to monitor for now, and recheck [place] in 1 year for stability.  ------

## 2014-03-27 ENCOUNTER — Other Ambulatory Visit: Payer: Self-pay | Admitting: Internal Medicine

## 2014-03-31 ENCOUNTER — Encounter: Payer: Self-pay | Admitting: Internal Medicine

## 2014-04-19 ENCOUNTER — Other Ambulatory Visit: Payer: Self-pay | Admitting: Internal Medicine

## 2014-04-21 ENCOUNTER — Ambulatory Visit (INDEPENDENT_AMBULATORY_CARE_PROVIDER_SITE_OTHER): Payer: BC Managed Care – PPO | Admitting: Internal Medicine

## 2014-04-21 ENCOUNTER — Telehealth: Payer: Self-pay

## 2014-04-21 ENCOUNTER — Encounter: Payer: Self-pay | Admitting: Internal Medicine

## 2014-04-21 VITALS — BP 116/78 | HR 82 | Temp 98.3°F | Resp 16 | Ht 69.5 in | Wt 186.5 lb

## 2014-04-21 DIAGNOSIS — R059 Cough, unspecified: Secondary | ICD-10-CM

## 2014-04-21 DIAGNOSIS — R05 Cough: Secondary | ICD-10-CM

## 2014-04-21 MED ORDER — HYDROCOD POLST-CHLORPHEN POLST 10-8 MG/5ML PO LQCR: 5.0000 mL | Freq: Every evening | ORAL | Status: DC | PRN

## 2014-04-21 NOTE — Patient Instructions (Signed)
Your cough may be coming from reflux, allergies, or post nasal drip (PND).  PND and allergies can be treated with benadryl near bedtime  And contnue daily zyrtec.   Benadryl is the most effective for drying you up but it is also the most sedating,  So try taking it at night and use one of the others in the daytime   Add Sudafed PE as needed for congestion  Consider using simply Saline at nighttime  to flush your sinuses  tussionex short term for nighttime use.

## 2014-04-21 NOTE — Progress Notes (Signed)
Pre-visit discussion using our clinic review tool. No additional management support is needed unless otherwise documented below in the visit note.  

## 2014-04-21 NOTE — Telephone Encounter (Signed)
The patient called and is asking for a prescription of tusinex (spelling?) cough medicine.  She states she does not have a fever.    Pharmacy - [facility] on [address] [phone]

## 2014-04-21 NOTE — Progress Notes (Signed)
Patient ID: Dawn Griffith, female   DOB: 06/07/1965, 48 y.o.   MRN: [id number]   Patient Active Problem List   Diagnosis Date Noted  . Cough 04/23/2014  . Goiter diffuse 03/05/2014  . Other and unspecified hyperlipidemia 11/07/2013  . Essential hypertension, benign 11/07/2013  . Anemia 11/28/2012  . Overweight (BMI 25.0-29.9) 11/04/2012  . Routine general medical examination at a health care facility 11/03/2011  . Constipation 05/07/2011  . GERD (gastroesophageal reflux disease) 12/30/2010  . Anxiety 12/30/2010    Subjective:  CC:   Chief Complaint  Patient presents with  . Cough    Night time dry hacking cough    HPI:   Dawn Griffith is a 48 y.o. female who presents for  6 day history of nocturnal cough,  Dry hacking,  Keeping her up at night  Some sinus drainage noted.  Denies dyspnea ,  Chest pain, and wheezing,  No myalgias or fevers.  No night sweats    Past Medical History  Diagnosis Date  . Cancer     as a child  . Hypertension     Past Surgical History  Procedure Laterality Date  . Cesarean section  1992  . Ectopic pregnancy surgery  1997  . Lymph node biopsy    . Nasal sinus surgery    . Colonoscopy    . Cholecystectomy  Dec 2012    Sankar       The following portions of the patient's history were reviewed and updated as appropriate: Allergies, current medications, and problem list.    Review of Systems:   Patient denies headache, fevers, malaise, unintentional weight loss, skin rash, eye pain, sinus congestion and sinus pain, sore throat, dysphagia,  hemoptysis , dyspnea, wheezing, chest pain, palpitations, orthopnea, edema, abdominal pain, nausea, melena, diarrhea, constipation, flank pain, dysuria, hematuria, urinary  Frequency, nocturia, numbness, tingling, seizures,  Focal weakness, Loss of consciousness,  Tremor, insomnia, depression, anxiety, and suicidal ideation.     History   Social History  . Marital Status: Married    Spouse Name:  N/A    Number of Children: N/A  . Years of Education: N/A   Occupational History  . Not on file.   Social History Main Topics  . Smoking status: Never Smoker   . Smokeless tobacco: Never Used  . Alcohol Use: No  . Drug Use: No  . Sexual Activity: Not on file   Other Topics Concern  . Not on file   Social History Narrative    Objective:  Filed Vitals:   04/21/14 1330  BP: 116/78  Pulse: 82  Temp: 98.3 F (36.8 C)  Resp: 16     General appearance: alert, cooperative and appears stated age Ears: normal TM's and external ear canals both ears Throat: lips, mucosa, and tongue normal; teeth and gums normal Neck: no adenopathy, no carotid bruit, supple, symmetrical, trachea midline and thyroid not enlarged, symmetric, no tenderness/mass/nodules Back: symmetric, no curvature. ROM normal. No CVA tenderness. Lungs: clear to auscultation bilaterally Heart: regular rate and rhythm, S1, S2 normal, no murmur, click, rub or gallop Abdomen: soft, non-tender; bowel sounds normal; no masses,  no organomegaly Pulses: 2+ and symmetric Skin: Skin color, texture, turgor normal. No rashes or lesions Lymph nodes: Cervical, supraclavicular, and axillary nodes normal.  Assessment and Plan:  Cough Discussed the possible etiologies of persistent cough including but not limited to post nasal drip,  Allergic rhinitis,  Asthma,  GERD and chronic cyclic cough due to persistent  irritation.  She is not taking and ACE Inhibitor.  Suggested treating for allergic rhinitis,  PND and GERD to break cycle and reevaluation in a few weeks.    Updated Medication List Outpatient Encounter Prescriptions as of 04/21/2014  Medication Sig  . ALPRAZolam (XANAX) 0.5 MG tablet TAKE 2 TABLETS BY MOUTH TWICE A DAY AS NEEDED FOR SLEEP OR ANXIETY  . amitriptyline (ELAVIL) 100 MG tablet TAKE 1 TABLET BY MOUTH AT BEDTIME  . ascorbic acid (VITAMIN C) 250 MG CHEW Chew 250 mg by mouth daily.  . cetirizine (ZYRTEC) 10 MG  tablet Take 10 mg by mouth daily.    . Lactobacillus (PROBIOTIC ACIDOPHILUS PO) Take 1 tablet by mouth daily.  Marland Kitchen losartan (COZAAR) 100 MG tablet TAKE 1 TABLET (100 MG TOTAL) BY MOUTH DAILY.  . Multiple Vitamin (MULTIVITAMIN) capsule Take 1 capsule by mouth daily.  Marland Kitchen omeprazole (PRILOSEC) 40 MG capsule TAKE ONE CAPSULE BY MOUTH EVERY DAY  . phentermine (ADIPEX-P) 37.5 MG tablet Take 1 tablet (37.5 mg total) by mouth daily before breakfast.  . spironolactone (ALDACTONE) 25 MG tablet TAKE 1 TABLET BY MOUTH EVERY DAY  . vitamin B-12 (CYANOCOBALAMIN) 1000 MCG tablet Take 1,000 mcg by mouth daily.  Marland Kitchen zolpidem (AMBIEN CR) 12.5 MG CR tablet TAKE 1 TABLET BY MOUTH AT BEDTIME AS NEEDED FOR SLEEP  . chlorpheniramine-HYDROcodone (TUSSIONEX) 10-8 MG/5ML LQCR Take 5 mLs by mouth at bedtime as needed for cough.     No orders of the defined types were placed in this encounter.    No Follow-up on file.

## 2014-04-21 NOTE — Telephone Encounter (Signed)
Spoke with pt, scheduled appoint 

## 2014-04-23 DIAGNOSIS — R05 Cough: Secondary | ICD-10-CM | POA: Insufficient documentation

## 2014-04-23 DIAGNOSIS — R059 Cough, unspecified: Secondary | ICD-10-CM | POA: Insufficient documentation

## 2014-04-23 NOTE — Assessment & Plan Note (Signed)
Discussed the possible etiologies of persistent cough including but not limited to post nasal drip,  Allergic rhinitis,  Asthma,  GERD and chronic cyclic cough due to persistent irritation.  She is not taking and ACE Inhibitor.  Suggested treating for allergic rhinitis,  PND and GERD to break cycle and reevaluation in a few weeks.  

## 2014-05-19 ENCOUNTER — Telehealth: Payer: Self-pay | Admitting: *Deleted

## 2014-05-19 MED ORDER — ZOLPIDEM TARTRATE ER 12.5 MG PO TBCR: EXTENDED_RELEASE_TABLET | ORAL | Status: DC

## 2014-05-19 NOTE — Telephone Encounter (Signed)
Ok to refill,  printed rx  

## 2014-05-19 NOTE — Telephone Encounter (Signed)
Rx faxed

## 2014-05-19 NOTE — Telephone Encounter (Signed)
Fax from pharmacy requesting Zolpidem ER 12.5mg .  Last refill 11.15.15.  Please advise refill

## 2014-05-29 ENCOUNTER — Other Ambulatory Visit: Payer: Self-pay | Admitting: Internal Medicine

## 2014-06-15 ENCOUNTER — Ambulatory Visit: Payer: BC Managed Care – PPO | Admitting: Internal Medicine

## 2014-06-17 ENCOUNTER — Other Ambulatory Visit: Payer: Self-pay | Admitting: Internal Medicine

## 2014-06-17 MED ORDER — SPIRONOLACTONE 25 MG PO TABS: 25.0000 mg | ORAL_TABLET | Freq: Every day | ORAL | Status: DC

## 2014-06-17 MED ORDER — AMITRIPTYLINE HCL 100 MG PO TABS: 100.0000 mg | ORAL_TABLET | Freq: Every day | ORAL | Status: DC

## 2014-06-17 MED ORDER — ALPRAZOLAM 0.5 MG PO TABS: ORAL_TABLET | ORAL | Status: DC

## 2014-06-17 MED ORDER — LOSARTAN POTASSIUM 100 MG PO TABS: ORAL_TABLET | ORAL | Status: DC

## 2014-06-17 NOTE — Telephone Encounter (Signed)
Ok to refill,  printed rx  

## 2014-06-17 NOTE — Telephone Encounter (Signed)
Patient reschedule d from 06/15/14 to 07/06/14 please advise of to refill all meds until then, except Ambien patient is trying to transfer this script because was just filled with 5 refills

## 2014-06-17 NOTE — Telephone Encounter (Signed)
Pt stopped by the office to notify that she has changed pharmacies to [place]. Please send all rx to this pharmacy/msn

## 2014-06-18 NOTE — Telephone Encounter (Signed)
rx faxed

## 2014-06-28 ENCOUNTER — Other Ambulatory Visit: Payer: Self-pay | Admitting: Internal Medicine

## 2014-07-06 ENCOUNTER — Encounter: Payer: Self-pay | Admitting: Internal Medicine

## 2014-07-06 ENCOUNTER — Ambulatory Visit (INDEPENDENT_AMBULATORY_CARE_PROVIDER_SITE_OTHER): Payer: BLUE CROSS/BLUE SHIELD | Admitting: Internal Medicine

## 2014-07-06 ENCOUNTER — Telehealth: Payer: Self-pay | Admitting: Internal Medicine

## 2014-07-06 VITALS — BP 104/76 | HR 90 | Temp 98.6°F | Resp 16 | Ht 70.0 in | Wt 188.8 lb

## 2014-07-06 DIAGNOSIS — Z8371 Family history of colonic polyps: Secondary | ICD-10-CM

## 2014-07-06 DIAGNOSIS — J029 Acute pharyngitis, unspecified: Secondary | ICD-10-CM

## 2014-07-06 DIAGNOSIS — E663 Overweight: Secondary | ICD-10-CM

## 2014-07-06 DIAGNOSIS — I10 Essential (primary) hypertension: Secondary | ICD-10-CM

## 2014-07-06 DIAGNOSIS — D259 Leiomyoma of uterus, unspecified: Secondary | ICD-10-CM

## 2014-07-06 DIAGNOSIS — E785 Hyperlipidemia, unspecified: Secondary | ICD-10-CM

## 2014-07-06 DIAGNOSIS — R059 Cough, unspecified: Secondary | ICD-10-CM

## 2014-07-06 DIAGNOSIS — R05 Cough: Secondary | ICD-10-CM

## 2014-07-06 LAB — POCT RAPID STREP A (OFFICE): Rapid Strep A Screen: NEGATIVE

## 2014-07-06 MED ORDER — PHENTERMINE HCL 37.5 MG PO TABS: 37.5000 mg | ORAL_TABLET | Freq: Every day | ORAL | Status: DC

## 2014-07-06 NOTE — Progress Notes (Signed)
Pre-visit discussion using our clinic review tool. No additional management support is needed unless otherwise documented below in the visit note.  

## 2014-07-06 NOTE — Progress Notes (Signed)
Patient ID: Dawn Griffith, female   DOB: Apr 14, 1966, 49 y.o.   MRN: [id number]  Patient Active Problem List   Diagnosis Date Noted  . Acute pharyngitis 07/07/2014  . Fibroid uterus 07/07/2014  . Family history of FAP (familial adenomatous polyposis) 07/07/2014  . Cough 04/23/2014  . Goiter diffuse 03/05/2014  . Hyperlipemia 11/07/2013  . Essential hypertension, benign 11/07/2013  . Anemia 11/28/2012  . Overweight (BMI 25.0-29.9) 11/04/2012  . Routine general medical examination at a health care facility 11/03/2011  . Constipation 05/07/2011  . GERD (gastroesophageal reflux disease) 12/30/2010  . Anxiety 12/30/2010    Subjective:  CC:   Chief Complaint  Patient presents with  . Follow-up    to check tsh t4 t3 per Dr.Phadke    HPI:   Dawn Griffith is a 49 y.o. female who presents for  Follow up on multiple issues:  1) persistent cough,:She was treated in November for PND and GERD  Ad her Cough has resolved.  2) Acute pharyngitis:  Sore throat started 3 days ago (Friday)   No sick contacts  [facility]   3) Overweight :  She Has been taking phentermine for weight loss , which was prescribed in November .  Has only lost a few lbs,  Not taking daily over the holidays.   4) History of normal colonoscopy:  Mother has polyposis of GI tract,  Patient wants another colonoscopy, las one was done in 2012 due to anxiety regarding  her mother's condition being pre cancerous and possibly hereditary,    Past Medical History  Diagnosis Date  . Cancer     as a child  . Hypertension     Past Surgical History  Procedure Laterality Date  . Cesarean section  1992  . Ectopic pregnancy surgery  1997  . Lymph node biopsy    . Nasal sinus surgery    . Colonoscopy    . Cholecystectomy  Dec 2012    Sankar       The following portions of the patient's history were reviewed and updated as appropriate: Allergies, current medications, and problem list.    Review of  Systems:   Patient denies headache, fevers, malaise, unintentional weight loss, skin rash, eye pain, sinus congestion and sinus pain, sore throat, dysphagia,  hemoptysis , cough, dyspnea, wheezing, chest pain, palpitations, orthopnea, edema, abdominal pain, nausea, melena, diarrhea, constipation, flank pain, dysuria, hematuria, urinary  Frequency, nocturia, numbness, tingling, seizures,  Focal weakness, Loss of consciousness,  Tremor, insomnia, depression, anxiety, and suicidal ideation.     History   Social History  . Marital Status: Married    Spouse Name: N/A    Number of Children: N/A  . Years of Education: N/A   Occupational History  . Not on file.   Social History Main Topics  . Smoking status: Never Smoker   . Smokeless tobacco: Never Used  . Alcohol Use: No  . Drug Use: No  . Sexual Activity: Not on file   Other Topics Concern  . Not on file   Social History Narrative    Objective:  Filed Vitals:   07/06/14 1029  BP: 104/76  Pulse: 90  Temp: 98.6 F (37 C)  Resp: 16     General appearance: alert, cooperative and appears stated age Ears: normal TM's and external ear canals both ears Throat:  tonsillar erythema without ulcerations.  lips, mucosa, and tongue normal; teeth and gums normal Neck: mild tender cervical lympadenopathy, no carotid  bruit, supple, symmetrical, trachea midline and thyroid not enlarged, symmetric Back: symmetric, no curvature. ROM normal. No CVA tenderness. Lungs: clear to auscultation bilaterally Heart: regular rate and rhythm, S1, S2 normal, no murmur, click, rub or gallop Abdomen: soft, non-tender; bowel sounds normal; no masses,  no organomegaly Pulses: 2+ and symmetric Skin: Skin color, texture, turgor normal. No rashes or lesions Lymph nodes: Cervical, supraclavicular, and axillary nodes normal.  Assessment and Plan:  Acute pharyngitis She has a 4 day history of pharyngitis without cough or PND  And is afebrile with negative  rapid strep.  Given her worsening condition will treat with abx and prednisone taper.  Probiotics advised.    Overweight (BMI 25.0-29.9) 3 month follow up on overweight  addressed with continued use of appetite suppressant,  Low GI diet, and exercise.  Patient feels generally well  But was advised to use phentermine more consistently until end of march then will discontinue.She denies tachycardia, elevated blood pressure readings,  And insomnia   Essential hypertension, benign Well controlled on current regimen. , no changes today.Return for fasting labs  Lab Results  Component Value Date   CREATININE 0.8 11/05/2013      Hyperlipemia New ACC guidelines recommend starting patients aged 28 or higher on moderate intensity statin therapy for LDL between 70-189 and 10 yr risk of CAD > 7.5% ;  Using the Framingham risk calculator,  her 10 year risk of coronary artery disease is 4.3% , so no therapy is recommended other tahtn diet and exercise  Will repeat prior to next visit.   Lab Results  Component Value Date   CHOL 212* 11/05/2013   HDL 44.00 11/05/2013   LDLCALC 149* 11/05/2013   LDLDIRECT 173.9 04/06/2011   TRIG 94.0 11/05/2013   CHOLHDL 5 11/05/2013        Family history of FAP (familial adenomatous polyposis) Patient had a screening colonoscopy at age 35 by Linton Ham for recent diagnosis of FAP in mother. I do not have the report ,  But she was told to follow up in 10 years.  Her desire for premature screening is due to concern , not new onset symptoms, so I advised her that the recommendations of her gastroenterologist were evidence based and without new onset symptoms an earlier colonoscopy would not be warranted.    Cough Resolved with treatment of PND and GERD>     Updated Medication List Outpatient Encounter Prescriptions as of 07/06/2014  Medication Sig  . ALPRAZolam (XANAX) 0.5 MG tablet TAKE 2 TABLETS BY MOUTH TWICE A DAY AS NEEDED FOR SLEEP OR ANXIETY  .  amitriptyline (ELAVIL) 100 MG tablet Take 1 tablet (100 mg total) by mouth at bedtime.  Marland Kitchen ascorbic acid (VITAMIN C) 250 MG CHEW Chew 250 mg by mouth daily.  . cetirizine (ZYRTEC) 10 MG tablet Take 10 mg by mouth daily.    . Lactobacillus (PROBIOTIC ACIDOPHILUS PO) Take 1 tablet by mouth daily.  Marland Kitchen losartan (COZAAR) 100 MG tablet TAKE 1 TABLET (100 MG TOTAL) BY MOUTH DAILY.  . Multiple Vitamin (MULTIVITAMIN) capsule Take 1 capsule by mouth daily.  Marland Kitchen omeprazole (PRILOSEC) 40 MG capsule TAKE ONE CAPSULE BY MOUTH EVERY DAY  . phentermine (ADIPEX-P) 37.5 MG tablet Take 1 tablet (37.5 mg total) by mouth daily before breakfast.  . spironolactone (ALDACTONE) 25 MG tablet Take 1 tablet (25 mg total) by mouth daily.  . vitamin B-12 (CYANOCOBALAMIN) 1000 MCG tablet Take 1,000 mcg by mouth daily.  Marland Kitchen zolpidem (AMBIEN CR) 12.5  MG CR tablet TAKE 1 TABLET BY MOUTH AT BEDTIME AS NEEDED FOR SLEEP  . [DISCONTINUED] phentermine (ADIPEX-P) 37.5 MG tablet Take 1 tablet (37.5 mg total) by mouth daily before breakfast.  . acyclovir ointment (ZOVIRAX) 5 % Apply 1 application topically every 3 (three) hours. to fever blister  . chlorpheniramine-HYDROcodone (TUSSIONEX) 10-8 MG/5ML LQCR Take 5 mLs by mouth at bedtime as needed for cough. (Patient not taking: Reported on 07/06/2014)  . levofloxacin (LEVAQUIN) 500 MG tablet Take 1 tablet (500 mg total) by mouth daily.  . predniSONE (DELTASONE) 10 MG tablet 6 tablets on Day 1 , then reduce by 1 tablet daily until gone     Orders Placed This Encounter  Procedures  . Throat culture [facility])  . POCT rapid strep A  . [facility] COLONOSCOPY    No Follow-up on file.

## 2014-07-06 NOTE — Telephone Encounter (Signed)
The patient is wanting to know when she can come in to have her T3, T4 and TSH drawn.

## 2014-07-06 NOTE — Patient Instructions (Signed)
I have refilled  The phentermine for 3 months.  Please take it daily and continue to exercise regularly.  If you have not lost 9 lbs by the end of 3 months  we will have to discontinue it.

## 2014-07-07 ENCOUNTER — Encounter: Payer: Self-pay | Admitting: Internal Medicine

## 2014-07-07 ENCOUNTER — Telehealth: Payer: Self-pay | Admitting: Internal Medicine

## 2014-07-07 DIAGNOSIS — Z8371 Family history of colonic polyps: Secondary | ICD-10-CM | POA: Insufficient documentation

## 2014-07-07 DIAGNOSIS — J029 Acute pharyngitis, unspecified: Secondary | ICD-10-CM | POA: Insufficient documentation

## 2014-07-07 DIAGNOSIS — Z8372 Family history of familial adenomatous polyposis: Secondary | ICD-10-CM | POA: Insufficient documentation

## 2014-07-07 DIAGNOSIS — D259 Leiomyoma of uterus, unspecified: Secondary | ICD-10-CM

## 2014-07-07 HISTORY — DX: Leiomyoma of uterus, unspecified: D25.9

## 2014-07-07 HISTORY — DX: Family history of colonic polyps: Z83.71

## 2014-07-07 HISTORY — DX: Family history of familial adenomatous polyposis: Z83.72

## 2014-07-07 MED ORDER — ACYCLOVIR 5 % EX OINT: 1.0000 "application " | TOPICAL_OINTMENT | CUTANEOUS | Status: DC

## 2014-07-07 MED ORDER — LEVOFLOXACIN 500 MG PO TABS: 500.0000 mg | ORAL_TABLET | Freq: Every day | ORAL | Status: DC

## 2014-07-07 MED ORDER — PREDNISONE 10 MG PO TABS: ORAL_TABLET | ORAL | Status: DC

## 2014-07-07 NOTE — Telephone Encounter (Signed)
Patient notified and voiced understanding.

## 2014-07-07 NOTE — Assessment & Plan Note (Signed)
She has a 4 day history of pharyngitis without cough or PND  And is afebrile with negative rapid strep.  Given her worsening condition will treat with abx and prednisone taper.  Probiotics advised.

## 2014-07-07 NOTE — Telephone Encounter (Signed)
Given her worsening condition will treat with abx and prednisone taper, which I will send to pharmacy .  Please take a probiotic ( Align, Floraque or Culturelle)for 3 weeks  to prevent a serious antibiotic associated diarrhea  Called clostirudium dificile colitis and a vaginal yeast infection  .

## 2014-07-07 NOTE — Assessment & Plan Note (Signed)
Resolved with treatment of PND and GERD>

## 2014-07-07 NOTE — Assessment & Plan Note (Signed)
Patient had a screening colonoscopy at age 49 by Linton Ham for recent diagnosis of FAP in mother. I do not have the report ,  But she was told to follow up in 10 years.  Her desire for premature screening is due to concern , not new onset symptoms, so I advised her that the recommendations of her gastroenterologist were evidence based and without new onset symptoms an earlier colonoscopy would not be warranted.

## 2014-07-07 NOTE — Assessment & Plan Note (Signed)
Well controlled on current regimen. , no changes today.Return for fasting labs  Lab Results  Component Value Date   CREATININE 0.8 11/05/2013

## 2014-07-07 NOTE — Assessment & Plan Note (Signed)
3 month follow up on overweight  addressed with continued use of appetite suppressant,  Low GI diet, and exercise.  Patient feels generally well  But was advised to use phentermine more consistently until end of march then will discontinue.She denies tachycardia, elevated blood pressure readings,  And insomnia

## 2014-07-07 NOTE — Telephone Encounter (Signed)
Patient given results of rapid strep, but patient came in concerned as to still having blister around mouth and stated throat is worse today. No fever or chills just sore throat and Hoarse. Please advise.

## 2014-07-07 NOTE — Telephone Encounter (Signed)
Please could you let her know that given her sore throat, now that she is on prednisone- now is not a good time to check her thyroid levels. I would recommend waiting 2 months to recheck her levels for thyroid.  I can put in for the thyroid panel then.  thanks

## 2014-07-07 NOTE — Assessment & Plan Note (Signed)
New ACC guidelines recommend starting patients aged 51 or higher on moderate intensity statin therapy for LDL between 70-189 and 10 yr risk of CAD > 7.5% ;  Using the Framingham risk calculator,  her 10 year risk of coronary artery disease is 4.3% , so no therapy is recommended other tahtn diet and exercise  Will repeat prior to next visit.   Lab Results  Component Value Date   CHOL 212* 11/05/2013   HDL 44.00 11/05/2013   LDLCALC 149* 11/05/2013   LDLDIRECT 173.9 04/06/2011   TRIG 94.0 11/05/2013   CHOLHDL 5 11/05/2013

## 2014-07-08 ENCOUNTER — Encounter: Payer: Self-pay | Admitting: Internal Medicine

## 2014-07-08 LAB — CULTURE, GROUP A STREP: Organism ID, Bacteria: NORMAL

## 2014-07-20 ENCOUNTER — Encounter: Payer: Self-pay | Admitting: Internal Medicine

## 2014-07-20 ENCOUNTER — Ambulatory Visit (INDEPENDENT_AMBULATORY_CARE_PROVIDER_SITE_OTHER): Payer: BLUE CROSS/BLUE SHIELD | Admitting: Internal Medicine

## 2014-07-20 VITALS — BP 106/72 | HR 116 | Temp 98.3°F | Resp 14 | Ht 69.0 in | Wt 189.1 lb

## 2014-07-20 DIAGNOSIS — B029 Zoster without complications: Secondary | ICD-10-CM

## 2014-07-20 MED ORDER — VALACYCLOVIR HCL 1 G PO TABS: 1000.0000 mg | ORAL_TABLET | Freq: Three times a day (TID) | ORAL | Status: DC

## 2014-07-20 NOTE — Progress Notes (Signed)
Pre-visit discussion using our clinic review tool. No additional management support is needed unless otherwise documented below in the visit note.  

## 2014-07-20 NOTE — Progress Notes (Signed)
Patient ID: Dawn Griffith, female   DOB: [birth year], 49 y.o.   MRN: [id number]   Subjective:    Patient ID: Dawn Griffith, female    DOB: [birth year], 49 y.o.   MRN: [id number]  HPI  Patient here as a work in with concerns regarding pain and rash - on her face and in her hairline.  Started having pain in her jaw and then developed a rash two days ago.  No fever.  Was on abx and prednisone two weeks ago.  Saw Dr Nadeen Landau a few days ago. Diagnosed with thrush.  Using Dukes Mouthwash.  Now with the rash that appears to be c/w shingles.  Denies any hearing change or hearing loss.     Past Medical History  Diagnosis Date  . Cancer     as a child  . Hypertension     Current Outpatient Prescriptions on File Prior to Visit  Medication Sig Dispense Refill  . acyclovir ointment (ZOVIRAX) 5 % Apply 1 application topically every 3 (three) hours. to fever blister 3 g 1  . ALPRAZolam (XANAX) 0.5 MG tablet TAKE 2 TABLETS BY MOUTH TWICE A DAY AS NEEDED FOR SLEEP OR ANXIETY 60 tablet 3  . amitriptyline (ELAVIL) 100 MG tablet Take 1 tablet (100 mg total) by mouth at bedtime. 90 tablet 1  . ascorbic acid (VITAMIN C) 250 MG CHEW Chew 250 mg by mouth daily.    . cetirizine (ZYRTEC) 10 MG tablet Take 10 mg by mouth daily.      . chlorpheniramine-HYDROcodone (TUSSIONEX) 10-8 MG/5ML LQCR Take 5 mLs by mouth at bedtime as needed for cough. 180 mL 0  . Lactobacillus (PROBIOTIC ACIDOPHILUS PO) Take 1 tablet by mouth daily.    Marland Kitchen levofloxacin (LEVAQUIN) 500 MG tablet Take 1 tablet (500 mg total) by mouth daily. 7 tablet 0  . losartan (COZAAR) 100 MG tablet TAKE 1 TABLET (100 MG TOTAL) BY MOUTH DAILY. 90 tablet 1  . Multiple Vitamin (MULTIVITAMIN) capsule Take 1 capsule by mouth daily.    Marland Kitchen omeprazole (PRILOSEC) 40 MG capsule TAKE ONE CAPSULE BY MOUTH EVERY DAY 30 capsule 5  . phentermine (ADIPEX-P) 37.5 MG tablet Take 1 tablet (37.5 mg total) by mouth daily before breakfast. 30 tablet 2  . predniSONE (DELTASONE)  10 MG tablet 6 tablets on Day 1 , then reduce by 1 tablet daily until gone 21 tablet 0  . spironolactone (ALDACTONE) 25 MG tablet Take 1 tablet (25 mg total) by mouth daily. 90 tablet 1  . vitamin B-12 (CYANOCOBALAMIN) 1000 MCG tablet Take 1,000 mcg by mouth daily.    Marland Kitchen zolpidem (AMBIEN CR) 12.5 MG CR tablet TAKE 1 TABLET BY MOUTH AT BEDTIME AS NEEDED FOR SLEEP 30 tablet 4   No current facility-administered medications on file prior to visit.    Review of Systems  Constitutional: Negative for fever and appetite change.  HENT: Positive for ear pain (rash - involves left ear and inside the ear.  ). Negative for congestion, sinus pressure and sore throat.   Eyes: Negative for pain and discharge.  Respiratory: Negative for cough and shortness of breath.   Gastrointestinal: Negative for nausea and vomiting.  Skin: Positive for rash (rash over the left side of her face and extending into her hair line and involves her ear).  Neurological: Negative for light-headedness and headaches (has some pain - locoalized to the rash.  thought had a tooth ache.  ).       Objective:  Pulse recheck:  104  Physical Exam  HENT:  Nose: Nose normal.  Mouth/Throat: Oropharynx is clear and moist.  Erythematous rash - left side of face and extends into her hair line.  Also involves the left ear and inside her ear.   Eyes: Conjunctivae are normal. Right eye exhibits no discharge. Left eye exhibits no discharge.  Neck: Neck supple. No thyromegaly present.  Cardiovascular: Normal rate and regular rhythm.   Pulmonary/Chest: Breath sounds normal. No respiratory distress. She has no wheezes.  Abdominal: Bowel sounds are normal.  Lymphadenopathy:    She has no cervical adenopathy.  Skin:  Erythematous based rash with vesicles.  Involves the left side of her face and extends into her hairline and involves the left ear.  Runs adjacent to her eye.      BP 106/72 mmHg  Pulse 116  Temp(Src) 98.3 F (36.8 C)  (Oral)  Resp 14  Ht 5\' 9"  (1.753 m)  Wt 189 lb 2 oz (85.787 kg)  BMI 27.92 kg/m2  SpO2 99%  LMP 05/01/2014 (Approximate) Wt Readings from Last 3 Encounters:  07/20/14 189 lb 2 oz (85.787 kg)  07/06/14 188 lb 12 oz (85.616 kg)  04/21/14 186 lb 8 oz (84.596 kg)     Lab Results  Component Value Date   WBC 7.4 11/05/2013   HGB 12.8 11/05/2013   HCT 39.5 11/05/2013   PLT 275.0 11/05/2013   GLUCOSE 102* 11/05/2013   CHOL 212* 11/05/2013   TRIG 94.0 11/05/2013   HDL 44.00 11/05/2013   LDLDIRECT 173.9 04/06/2011   LDLCALC 149* 11/05/2013   ALT 15 11/05/2013   AST 18 11/05/2013   NA 137 11/05/2013   K 4.5 11/05/2013   CL 105 11/05/2013   CREATININE 0.8 11/05/2013   BUN 11 11/05/2013   CO2 26 11/05/2013   TSH 0.56 02/27/2014       Assessment & Plan:   Problem List Items Addressed This Visit    Herpes zoster - Primary    Treat with valtrex 1000mg  tid x 10 days.  Discussed with Dr Nadeen Landau - given ear involvement.  No subjective hearing loss.  He will see pt in the office within the next couple of days.  He will make the appointment.  Will also have ophthalmology evaluate to confirm no eye involvement.  Does not appear to have eye involvement, but given proximity, will have ophthalmology evaluate as well.  Pain is controlled.  She does not feel she needs anything more.  Follow.        Relevant Medications   nystatin (MYCOSTATIN) 100000 UNIT/ML suspension   valACYclovir (VALTREX) tablet       Einar Pheasant, MD

## 2014-07-20 NOTE — Assessment & Plan Note (Addendum)
Treat with valtrex 1000mg  tid x 10 days.  Discussed with Dr Nadeen Landau - given ear involvement.  No subjective hearing loss.  He will see pt in the office within the next couple of days.  He will make the appointment.  Will also have ophthalmology evaluate to confirm no eye involvement.  Does not appear to have eye involvement, but given proximity, will have ophthalmology evaluate as well.  Pain is controlled.  She does not feel she needs anything more.  Follow.

## 2014-07-20 NOTE — Addendum Note (Signed)
Addended by: Alisa Graff on: 07/20/2014 08:14 PM   Modules accepted: Orders

## 2014-07-24 ENCOUNTER — Telehealth: Payer: Self-pay | Admitting: *Deleted

## 2014-07-24 MED ORDER — GABAPENTIN 100 MG PO CAPS: 100.0000 mg | ORAL_CAPSULE | Freq: Three times a day (TID) | ORAL | Status: DC

## 2014-07-24 NOTE — Telephone Encounter (Signed)
Pt called states she was seen on 2.15.16 by Dr Nicki Reaper, diagnosed with shingles.  Pt states Advil is not helping with the pain.  Pt is requesting something for pain.  Please advise

## 2014-07-24 NOTE — Telephone Encounter (Signed)
Patient here in the office today requesting pain medication. Patient stated she was seen by ophthalomology on Wednesday and ENT on Thursday. She is taking 2 advils every 4 hours. She has not taken any tylenol and nobody has given her any pain meds so far. Patient has never taken gabapentin before but she is willing to try it.

## 2014-07-24 NOTE — Telephone Encounter (Signed)
Per Dr. Nicki Reaper Gabapentin 100mg  TID 90 tabs no refills sent t pharmacy.

## 2014-07-24 NOTE — Telephone Encounter (Signed)
Please call pt and find out if she saw ophthalmology and ent.   Also, find out how much advil is she taking and if she is taking any tylenol with this.  Confirm no one else has given her any pain medications.  Has she ever taken gabapentin previously?

## 2014-07-26 NOTE — Telephone Encounter (Signed)
I spoke to pt.  Discussed her pain. She is taking an antiinflammatory multiple times per day.  Needs something more.  Pain in her left ear.  Saw ophthalmology and ENT.  Continues f/u with them.  Will treat with gabapentin 100mg  tid #90 with no refills.  Call with update.  Pt comfortable with this plan.  Discussed side effects of gabapentin.

## 2014-08-05 ENCOUNTER — Encounter: Payer: Self-pay | Admitting: Nurse Practitioner

## 2014-08-05 ENCOUNTER — Ambulatory Visit (INDEPENDENT_AMBULATORY_CARE_PROVIDER_SITE_OTHER): Payer: BLUE CROSS/BLUE SHIELD | Admitting: Nurse Practitioner

## 2014-08-05 VITALS — BP 122/82 | HR 88 | Temp 98.1°F | Resp 12 | Ht 69.0 in

## 2014-08-05 DIAGNOSIS — Z7189 Other specified counseling: Secondary | ICD-10-CM

## 2014-08-05 DIAGNOSIS — Z0000 Encounter for general adult medical examination without abnormal findings: Secondary | ICD-10-CM | POA: Insufficient documentation

## 2014-08-05 DIAGNOSIS — Z7184 Encounter for health counseling related to travel: Secondary | ICD-10-CM

## 2014-08-05 MED ORDER — CIPROFLOXACIN HCL 500 MG PO TABS: 500.0000 mg | ORAL_TABLET | Freq: Two times a day (BID) | ORAL | Status: DC

## 2014-08-05 NOTE — Progress Notes (Signed)
   Subjective:    Patient ID: Dawn Griffith, female    DOB: 09/20/1965, 49 y.o.   MRN: [id number]  HPI  Dawn Griffith is a 49 yo female with a CC of traveling out of the country.  1) She is going on a cruise to [place] and [place]. She is unsure if she will be going off the ship to do local activities at this point and is requesting a antibiotic to take with her in case of infectious diarrhea.   Review of Systems  Constitutional: Negative for fever, chills, diaphoresis and fatigue.  Respiratory: Negative for chest tightness, shortness of breath and wheezing.   Cardiovascular: Negative for chest pain, palpitations and leg swelling.  Gastrointestinal: Negative for nausea, vomiting and diarrhea.  Skin: Negative for rash.  Neurological: Negative for dizziness, weakness, numbness and headaches.  Psychiatric/Behavioral: The patient is not nervous/anxious.       Objective:   Physical Exam  Constitutional: She is oriented to person, place, and time. She appears well-developed and well-nourished. No distress.  BP 122/82 mmHg  Pulse 88  Temp(Src) 98.1 F (36.7 C) (Oral)  Resp 12  Ht 5\' 9"  (1.753 m)  Wt   SpO2 99%  LMP 05/01/2014 (Approximate)   HENT:  Head: Normocephalic and atraumatic.  Right Ear: External ear normal.  Left Ear: External ear normal.  Cardiovascular: Normal rate, regular rhythm, normal heart sounds and intact distal pulses.  Exam reveals no gallop and no friction rub.   No murmur heard. Pulmonary/Chest: Effort normal and breath sounds normal. No respiratory distress. She has no wheezes. She has no rales. She exhibits no tenderness.  Neurological: She is alert and oriented to person, place, and time. No cranial nerve deficit. She exhibits normal muscle tone. Coordination normal.  Skin: Skin is warm and dry. No rash noted. She is not diaphoretic.  Psychiatric: She has a normal mood and affect. Her behavior is normal. Judgment and thought content normal.        Assessment & Plan:

## 2014-08-05 NOTE — Progress Notes (Signed)
Pre visit review using our clinic review tool, if applicable. No additional management support is needed unless otherwise documented below in the visit note. 

## 2014-08-05 NOTE — Patient Instructions (Signed)
Safe travels!

## 2014-08-05 NOTE — Assessment & Plan Note (Signed)
Cruise to [place] and [employer] (unsure if she mentioned a third location). Cipro 500 mg x 3 days in case of traveler's diarrhea. Asked her to only take if needed. Gave pt handout from [facility] on safe travel tips and insect repellent tips.

## 2014-08-26 ENCOUNTER — Ambulatory Visit (INDEPENDENT_AMBULATORY_CARE_PROVIDER_SITE_OTHER): Payer: BLUE CROSS/BLUE SHIELD | Admitting: Nurse Practitioner

## 2014-08-26 ENCOUNTER — Encounter: Payer: Self-pay | Admitting: Nurse Practitioner

## 2014-08-26 VITALS — BP 114/74 | HR 100 | Resp 14 | Ht 69.0 in

## 2014-08-26 DIAGNOSIS — B029 Zoster without complications: Secondary | ICD-10-CM

## 2014-08-26 MED ORDER — VALACYCLOVIR HCL 1 G PO TABS: 1000.0000 mg | ORAL_TABLET | Freq: Two times a day (BID) | ORAL | Status: DC

## 2014-08-26 NOTE — Assessment & Plan Note (Signed)
Will treat with Valtrex 1000 mg twice daily because no active blisters. Will try some prophylaxis (no clear guidelines on this). Sensitivity and color change present without vesicles. In same area as previous shingles.

## 2014-08-26 NOTE — Progress Notes (Signed)
Pre visit review using our clinic review tool, if applicable. No additional management support is needed unless otherwise documented below in the visit note. 

## 2014-08-26 NOTE — Progress Notes (Signed)
   Subjective:    Patient ID: Dawn Griffith, female    DOB: 04-Jan-1966, 50 y.o.   MRN: [id number]  HPI  Dawn Griffith is a 49 yo female with a CC of possible shingles.   1) Noticed this morning a skin changed, has chapped lips, and reports high stress related to her job and home life. Noticed a color change and possibly some sensitivity, but unsure. This is in the same location as her last shingles outbreak along her lower left jaw.   Review of Systems  Constitutional: Negative for fever, chills, diaphoresis and fatigue.  Respiratory: Negative for chest tightness, shortness of breath and wheezing.   Cardiovascular: Negative for chest pain, palpitations and leg swelling.  Gastrointestinal: Negative for nausea, vomiting and diarrhea.  Skin: Positive for color change.       Slightly red/brown patch of skin on lower left jaw and chin on left side. Lips red and chapped.   Neurological: Negative for dizziness, weakness, numbness and headaches.  Psychiatric/Behavioral: The patient is not nervous/anxious.       Objective:   Physical Exam  Constitutional: She is oriented to person, place, and time. She appears well-developed and well-nourished. No distress.  BP 114/74 mmHg  Pulse 100  Resp 14  Ht 5\' 9"  (1.753 m)  Wt   SpO2 99%   HENT:  Head: Normocephalic and atraumatic.    Right Ear: External ear normal.  Left Ear: External ear normal.  The lines depict the areas of color change Tongue- she has a small ulcer on the left side and of her tongue and reports she bit the side of her tongue  Cardiovascular: Normal rate, regular rhythm, normal heart sounds and intact distal pulses.  Exam reveals no gallop and no friction rub.   No murmur heard. Pulmonary/Chest: Effort normal and breath sounds normal. No respiratory distress. She has no wheezes. She has no rales. She exhibits no tenderness.  Neurological: She is alert and oriented to person, place, and time. No cranial nerve deficit. She exhibits  normal muscle tone. Coordination normal.  Skin: Skin is warm and dry. No rash noted. She is not diaphoretic.  Psychiatric: She has a normal mood and affect. Her behavior is normal. Judgment and thought content normal.        Assessment & Plan:

## 2014-08-26 NOTE — Patient Instructions (Signed)
Go ahead and start the Valtrex.

## 2014-09-09 ENCOUNTER — Other Ambulatory Visit: Payer: Self-pay | Admitting: Internal Medicine

## 2014-09-09 NOTE — Telephone Encounter (Signed)
Ok to refill,  printed rx  

## 2014-09-09 NOTE — Telephone Encounter (Signed)
Rx faxed

## 2014-09-09 NOTE — Telephone Encounter (Signed)
Ok refill? 

## 2014-10-01 ENCOUNTER — Other Ambulatory Visit: Payer: Self-pay | Admitting: *Deleted

## 2014-10-01 MED ORDER — ALPRAZOLAM 0.5 MG PO TABS: ORAL_TABLET | ORAL | Status: DC

## 2014-10-01 NOTE — Telephone Encounter (Signed)
Ok to refill,  printed rx  

## 2014-10-01 NOTE — Telephone Encounter (Signed)
Ok refill? Last visit with you 07/06/14

## 2014-10-01 NOTE — Telephone Encounter (Signed)
Faxed to pharmacy

## 2014-10-06 ENCOUNTER — Other Ambulatory Visit: Payer: Self-pay | Admitting: Internal Medicine

## 2014-11-09 ENCOUNTER — Ambulatory Visit (INDEPENDENT_AMBULATORY_CARE_PROVIDER_SITE_OTHER): Payer: BLUE CROSS/BLUE SHIELD | Admitting: Internal Medicine

## 2014-11-09 ENCOUNTER — Telehealth: Payer: Self-pay | Admitting: Internal Medicine

## 2014-11-09 VITALS — BP 104/64 | HR 103 | Temp 98.3°F | Resp 16 | Ht 69.0 in | Wt 191.0 lb

## 2014-11-09 DIAGNOSIS — K625 Hemorrhage of anus and rectum: Secondary | ICD-10-CM | POA: Diagnosis not present

## 2014-11-09 MED ORDER — HYDROCORTISONE ACE-PRAMOXINE 1-1 % RE CREA: 1.0000 "application " | TOPICAL_CREAM | Freq: Two times a day (BID) | RECTAL | Status: DC

## 2014-11-09 NOTE — Telephone Encounter (Signed)
FYI

## 2014-11-09 NOTE — Progress Notes (Signed)
Subjective:  Patient ID: Dawn Griffith, female    DOB: 1966-06-04  Age: 49 y.o. MRN: [id number]  CC: The encounter diagnosis was Rectal bleeding.  HPI Nakshatra C Venezia presents for  Rectal bleeding. Occurred yesterday . Got light headed,  Had diarrhea , several episodes  Today having some blood with resolving diarrhea.  Some Rectal burning     Outpatient Prescriptions Prior to Visit  Medication Sig Dispense Refill  . ALPRAZolam (XANAX) 0.5 MG tablet TAKE 2 TABLETS BY MOUTH TWICE A DAY AS NEEDED FOR SLEEP OR ANXIETY 60 tablet 5  . amitriptyline (ELAVIL) 100 MG tablet TAKE ONE TABLET BY MOUTH AT BEDTIME 90 tablet 0  . cetirizine (ZYRTEC) 10 MG tablet Take 10 mg by mouth daily.      . Lactobacillus (PROBIOTIC ACIDOPHILUS PO) Take 1 tablet by mouth daily.    Marland Kitchen losartan (COZAAR) 100 MG tablet TAKE 1 TABLET (100 MG TOTAL) BY MOUTH DAILY. 90 tablet 1  . Multiple Vitamin (MULTIVITAMIN) capsule Take 1 capsule by mouth daily.    Marland Kitchen omeprazole (PRILOSEC) 40 MG capsule TAKE 1 CAPSULE EVERY DAY 30 capsule 5  . spironolactone (ALDACTONE) 25 MG tablet Take 1 tablet (25 mg total) by mouth daily. 90 tablet 1  . zolpidem (AMBIEN CR) 12.5 MG CR tablet TAKE 1 TABLET AT BEDTIME AS NEEDED FOR SLEEP 30 tablet 5  . phentermine (ADIPEX-P) 37.5 MG tablet Take 1 tablet (37.5 mg total) by mouth daily before breakfast. (Patient not taking: Reported on 08/26/2014) 30 tablet 2  . valACYclovir (VALTREX) 1000 MG tablet Take 1 tablet (1,000 mg total) by mouth 2 (two) times daily. (Patient not taking: Reported on 11/09/2014) 10 tablet 0   No facility-administered medications prior to visit.    Review of Systems;  Patient denies headache, fevers, malaise, unintentional weight loss, skin rash, eye pain, sinus congestion and sinus pain, sore throat, dysphagia,  hemoptysis , cough, dyspnea, wheezing, chest pain, palpitations, orthopnea, edema, abdominal pain, nausea, melena, diarrhea, constipation, flank pain, dysuria, hematuria,  urinary  Frequency, nocturia, numbness, tingling, seizures,  Focal weakness, Loss of consciousness,  Tremor, insomnia, depression, anxiety, and suicidal ideation.      Objective:  BP 104/64 mmHg  Pulse 103  Temp(Src) 98.3 F (36.8 C)  Resp 16  Ht 5\' 9"  (1.753 m)  Wt 191 lb (86.637 kg)  BMI 28.19 kg/m2  SpO2 98%  BP Readings from Last 3 Encounters:  11/09/14 104/64  08/26/14 114/74  08/05/14 122/82    Wt Readings from Last 3 Encounters:  11/09/14 191 lb (86.637 kg)  07/20/14 189 lb 2 oz (85.787 kg)  07/06/14 188 lb 12 oz (85.616 kg)    General appearance: alert, cooperative and appears stated age Back: symmetric, no curvature. ROM normal. No CVA tenderness. Lungs: clear to auscultation bilaterally Heart: regular rate and rhythm, S1, S2 normal, no murmur, click, rub or gallop Abdomen: soft, non-tender; bowel sounds normal; no masses,  no organomegaly Pulses: 2+ and symmetric Rectum: internal hemorrhoids, nonbleeding currently Skin: Skin color, texture, turgor normal. No rashes or lesions Lymph nodes: Cervical, supraclavicular, and axillary nodes normal.  No results found for: HGBA1C  Lab Results  Component Value Date   CREATININE 0.8 11/05/2013   CREATININE 0.7 11/04/2012   CREATININE 0.73 11/01/2011    Lab Results  Component Value Date   WBC 7.4 11/05/2013   HGB 12.8 11/05/2013   HCT 39.5 11/05/2013   PLT 275.0 11/05/2013   GLUCOSE 102* 11/05/2013   CHOL 212*  11/05/2013   TRIG 94.0 11/05/2013   HDL 44.00 11/05/2013   LDLDIRECT 173.9 04/06/2011   LDLCALC 149* 11/05/2013   ALT 15 11/05/2013   AST 18 11/05/2013   NA 137 11/05/2013   K 4.5 11/05/2013   CL 105 11/05/2013   CREATININE 0.8 11/05/2013   BUN 11 11/05/2013   CO2 26 11/05/2013   TSH 0.56 02/27/2014    No results found.  Assessment & Plan:   Problem List Items Addressed This Visit    Rectal bleeding - Primary    Due to diarrhea and internal hemorrhoids,  Pramoxine.hc cream bid,  Sitz  baths.  If no improvement.  GI referral for colonoscopy.          I am having Ms. Aplin start on pramoxine-hydrocortisone. I am also having her maintain her cetirizine, multivitamin, Lactobacillus (PROBIOTIC ACIDOPHILUS PO), losartan, spironolactone, phentermine, valACYclovir, omeprazole, zolpidem, ALPRAZolam, and amitriptyline.  Meds ordered this encounter  Medications  . pramoxine-hydrocortisone (PROCTOCREAM-HC) 1-1 % rectal cream    Sig: Place 1 application rectally 2 (two) times daily.    Dispense:  30 g    Refill:  0    There are no discontinued medications.  Follow-up: No Follow-up on file.   Crecencio Mc, MD

## 2014-11-09 NOTE — Patient Instructions (Signed)
Rectal Bleeding °Rectal bleeding is when blood passes out of the anus. It is usually a sign that something is wrong. It may not be serious, but it should always be evaluated. Rectal bleeding may present as bright red blood or extremely dark stools. The color may range from dark red or maroon to black (like tar). It is important that the cause of rectal bleeding be identified so treatment can be started and the problem corrected. °CAUSES  °· Hemorrhoids. These are enlarged (dilated) blood vessels or veins in the anal or rectal area. °· Fistulas. These are abnormal, burrowing channels that usually run from inside the rectum to the skin around the anus. They can bleed. °· Anal fissures. This is a tear in the tissue of the anus. Bleeding occurs with bowel movements. °· Diverticulosis. This is a condition in which pockets or sacs project from the bowel wall. Occasionally, the sacs can bleed. °· Diverticulitis. This is an infection involving diverticulosis of the colon. °· Proctitis and colitis. These are conditions in which the rectum, colon, or both, can become inflamed and pitted (ulcerated). °· Polyps and cancer. Polyps are non-cancerous (benign) growths in the colon that may bleed. Certain types of polyps turn into cancer. °· Protrusion of the rectum. Part of the rectum can project from the anus and bleed. °· Certain medicines. °· Intestinal infections. °· Blood vessel abnormalities. °HOME CARE INSTRUCTIONS °· Eat a high-fiber diet to keep your stool soft. °· Limit activity. °· Drink enough fluids to keep your urine clear or pale yellow. °· Warm baths may be useful to soothe rectal pain. °· Follow up with your caregiver as directed. °SEEK IMMEDIATE MEDICAL CARE IF: °· You develop increased bleeding. °· You have black or dark red stools. °· You vomit blood or material that looks like coffee grounds. °· You have abdominal pain or tenderness. °· You have a fever. °· You feel weak, nauseous, or you faint. °· You have  severe rectal pain or you are unable to have a bowel movement. °MAKE SURE YOU: °· Understand these instructions. °· Will watch your condition. °· Will get help right away if you are not doing well or get worse. °Document Released: 11/11/2001 Document Revised: 08/14/2011 Document Reviewed: 11/06/2010 °Exit[employer]® Patient Information ©2015 [employer]. This information is not intended to replace advice given to you by your health care provider. Make sure you discuss any questions you have with your health care provider. ° °

## 2014-11-09 NOTE — Telephone Encounter (Signed)
[facility]  Patient Name: Dawn Griffith  Gender: Female  DOB: 09/24/1965   Age: 49 Y 11 M 19 D  Return Phone Number: [phone] (Primary)  Address:   City/State/Zip: [place]    Client [facility]  Client Site [facility]  Contact Type Call  Call Type Triage / [place] Name Eldana  Relationship To Patient Self  Appointment Disposition EMR Appointment Scheduled  Info pasted into Epic Yes  Return Phone Number [phone] (Primary)  Chief Complaint Blood In Stool  Initial Comment Caller states she has had some abdominal pain, diarrhea. Bright blood.  PreDisposition Call Doctor   Nurse Assessment  Nurse: Buck Mam, RN, Trish Date/Time Eilene Ghazi Time): 11/09/2014 10:52:45 AM  Confirm and document reason for call. If symptomatic, describe symptoms. ---Patient is calling for self and caller states she has had some abdominal pain, diarrhea. Bright blood. 5-6 yesterday watery. Today had small loose stool and had bright red 2 table spoons. Patient has in the past had hemorrhoids. She has been drinking fluids and is able to eat voids without problems. No abdominal pain.  Has the patient traveled out of the country within the last 30 days? ---No  Does the patient require triage? ---Yes  Related visit to physician within the last 2 weeks? ---No  Does the PT have any chronic conditions? (i.e. diabetes, asthma, etc.) ---Yes  List chronic conditions. ---hypertension and reflux  Did the patient indicate they were pregnant? ---No     Guidelines      Guideline Title Affirmed Question Affirmed Notes Nurse Date/Time Eilene Ghazi Time)  Rectal Bleeding Rectal bleeding (Exceptions: blood just on toilet paper, few drops, streaks on surface of normal formed BM)  Maples, RN, Trish 11/09/2014 10:56:47 AM   Disp. Time Eilene Ghazi Time) Disposition  Final User          11/09/2014 11:00:11 AM See Physician within 24 Hours Yes Buck Mam, RN, Fish farm manager Understands: Yes  Disagree/Comply: Comply     Care Advice Given Per Guideline      SEE PHYSICIAN WITHIN 24 HOURS: CALL BACK IF: * Dizziness occurs * Bleeding increases * You become worse. CARE ADVICE given per Rectal Bleeding (Adult) guideline.   After Care Instructions Given     Call Event Type User Date / Time Description        Referrals  REFERRED TO PCP OFFICE

## 2014-11-09 NOTE — Progress Notes (Signed)
Pre visit review using our clinic review tool, if applicable. No additional management support is needed unless otherwise documented below in the visit note. 

## 2014-11-10 NOTE — Assessment & Plan Note (Signed)
Due to diarrhea and internal hemorrhoids,  Pramoxine.hc cream bid,  Sitz baths.  If no improvement.  GI referral for colonoscopy.

## 2014-12-02 ENCOUNTER — Encounter: Payer: Self-pay | Admitting: *Deleted

## 2014-12-02 LAB — HM MAMMOGRAPHY: HM Mammogram: NEGATIVE

## 2014-12-16 ENCOUNTER — Encounter: Payer: Self-pay | Admitting: Internal Medicine

## 2014-12-16 ENCOUNTER — Ambulatory Visit (INDEPENDENT_AMBULATORY_CARE_PROVIDER_SITE_OTHER): Payer: BLUE CROSS/BLUE SHIELD | Admitting: Internal Medicine

## 2014-12-16 VITALS — BP 110/76 | HR 99 | Temp 98.6°F | Resp 12 | Ht 69.0 in | Wt 190.0 lb

## 2014-12-16 DIAGNOSIS — R7989 Other specified abnormal findings of blood chemistry: Secondary | ICD-10-CM

## 2014-12-16 DIAGNOSIS — R946 Abnormal results of thyroid function studies: Secondary | ICD-10-CM | POA: Diagnosis not present

## 2014-12-16 DIAGNOSIS — I10 Essential (primary) hypertension: Secondary | ICD-10-CM | POA: Diagnosis not present

## 2014-12-16 DIAGNOSIS — J029 Acute pharyngitis, unspecified: Secondary | ICD-10-CM | POA: Diagnosis not present

## 2014-12-16 DIAGNOSIS — Z0000 Encounter for general adult medical examination without abnormal findings: Secondary | ICD-10-CM | POA: Diagnosis not present

## 2014-12-16 DIAGNOSIS — E663 Overweight: Secondary | ICD-10-CM

## 2014-12-16 DIAGNOSIS — B029 Zoster without complications: Secondary | ICD-10-CM

## 2014-12-16 DIAGNOSIS — E785 Hyperlipidemia, unspecified: Secondary | ICD-10-CM

## 2014-12-16 DIAGNOSIS — Z8371 Family history of colonic polyps: Secondary | ICD-10-CM

## 2014-12-16 LAB — COMPREHENSIVE METABOLIC PANEL
ALBUMIN: 4.6 g/dL (ref 3.5–5.2)
ALT: 13 U/L (ref 0–35)
AST: 14 U/L (ref 0–37)
Alkaline Phosphatase: 70 U/L (ref 39–117)
BILIRUBIN TOTAL: 0.6 mg/dL (ref 0.2–1.2)
BUN: 13 mg/dL (ref 6–23)
CALCIUM: 9.9 mg/dL (ref 8.4–10.5)
CHLORIDE: 103 meq/L (ref 96–112)
CO2: 27 mEq/L (ref 19–32)
Creatinine, Ser: 0.78 mg/dL (ref 0.40–1.20)
GFR: 82.73 mL/min (ref >60.00)
Glucose, Bld: 90 mg/dL (ref 70–99)
POTASSIUM: 4.6 meq/L (ref 3.5–5.1)
Sodium: 139 mEq/L (ref 135–145)
Total Protein: 7.4 g/dL (ref 6.0–8.3)

## 2014-12-16 LAB — CBC WITH DIFFERENTIAL/PLATELET
BASOS PCT: 0.5 % (ref 0.0–3.0)
Basophils Absolute: 0 10*3/uL (ref 0.0–0.1)
EOS PCT: 2 % (ref 0.0–5.0)
Eosinophils Absolute: 0.1 10*3/uL (ref 0.0–0.7)
HEMATOCRIT: 41 % (ref 36.0–46.0)
HEMOGLOBIN: 13.7 g/dL (ref 12.0–15.0)
Lymphocytes Relative: 31.1 % (ref 12.0–46.0)
Lymphs Abs: 2.1 10*3/uL (ref 0.7–4.0)
MCHC: 33.4 g/dL (ref 30.0–36.0)
MCV: 86.4 fl (ref 78.0–100.0)
MONO ABS: 0.4 10*3/uL (ref 0.1–1.0)
Monocytes Relative: 5.9 % (ref 3.0–12.0)
NEUTROS PCT: 60.5 % (ref 43.0–77.0)
Neutro Abs: 4.1 10*3/uL (ref 1.4–7.7)
PLATELETS: 248 10*3/uL (ref 150.0–400.0)
RBC: 4.75 Mil/uL (ref 3.87–5.11)
RDW: 14.3 % (ref 11.5–15.5)
WBC: 6.8 10*3/uL (ref 4.0–10.5)

## 2014-12-16 LAB — LIPID PANEL
CHOL/HDL RATIO: 5
CHOLESTEROL: 249 mg/dL — AB (ref 0–200)
HDL: 48.4 mg/dL (ref >39.00)
LDL Cholesterol: 169 mg/dL — ABNORMAL HIGH (ref 0–99)
NonHDL: 200.6
TRIGLYCERIDES: 157 mg/dL — AB (ref 0.0–149.0)
VLDL: 31.4 mg/dL (ref 0.0–40.0)

## 2014-12-16 LAB — TSH: TSH: 0.84 u[IU]/mL (ref 0.35–4.50)

## 2014-12-16 LAB — T4, FREE: FREE T4: 0.72 ng/dL (ref 0.60–1.60)

## 2014-12-16 LAB — T3, FREE: T3 FREE: 3.8 pg/mL (ref 2.3–4.2)

## 2014-12-16 NOTE — Progress Notes (Signed)
Patient ID: Dawn Griffith, female    DOB: [birth year]  Age: 49 y.o. MRN: [id number]  The patient is here for annual Medicare wellness examination and management of other chronic and acute problems.    PAP normal 2015  Mammogram jULY 2016.  No pelvic issues , wants to defer annual pelvic.  The risk factors are reflected in the social history. The roster of all physicians providing medical care to patient - is listed in the Snapshot section of the chart.  Activities of daily living:isThe patient is 100% independent in all ADLs: dressing, toileting, feeding as well as independent mobility  Home safety : The patient has smoke detectors in the home. They wear seatbelts.  There are no firearms at home. There is no violence in the home.   There is no risks for hepatitis, STDs or HIV. There is no   history of blood transfusion. They have no travel history to infectious disease endemic areas of the world.  The patient has seen their dentist in the last six month. They have seen their eye doctor in the last year. They admit to slight hearing difficulty with regard to whispered voices and some television programs.  They have deferred audiologic testing in the last year.  They do not  have excessive sun exposure. Discussed the need for sun protection: hats, long sleeves and use of sunscreen if there is significant sun exposure.   Diet: the importance of a healthy diet is discussed. They do have a healthy diet.  The benefits of regular aerobic exercise were discussed. She walks 4 times per week ,  20 minutes.   Depression screen: there are no signs or vegative symptoms of depression- irritability, change in appetite, anhedonia, sadness/tearfullness.  Cognitive assessment: the patient manages all their financial and personal affairs and is actively engaged. They could relate day,date,year and events; recalled 2/3 objects at 3 minutes; performed clock-face test normally.  The following portions of the  patient's history were reviewed and updated as appropriate: allergies, current medications, past family history, past medical history,  past surgical history, past social history  and problem list.  Visual acuity was not assessed per patient preference since she has regular follow up with her ophthalmologist. Hearing and body mass index were assessed and reviewed.   During the course of the visit the patient was educated and counseled about appropriate screening and preventive services including : fall prevention , diabetes screening, nutrition counseling, colorectal cancer screening, and recommended immunizations.    CC: The primary encounter diagnosis was Abnormal thyroid blood test. Diagnoses of Overweight (BMI 25.0-29.9), Essential hypertension, benign, Acute pharyngitis, unspecified pharyngitis type, Hyperlipemia, Routine general medical examination at a health care facility, Family history of FAP (familial adenomatous polyposis), and Herpes zoster were also pertinent to this visit.   Her last menses was November 2015.  Has had a  few hot flashes  Obesity:  She is She is not losing weight but she is not surprised because shee is  not exercising due to increasaed responsibilities caring for her mother who is in [place] of shingles involving left side of face. Finally resolving   One spot left on left side of face,  Neuralgia has resolved.         [name] has a past medical history of Cancer and Hypertension.   She has past surgical history that includes Cesarean section (1992); Ectopic pregnancy surgery (1997); Lymph node biopsy; Nasal sinus surgery; Colonoscopy; and Cholecystectomy (  Dec 2012).   Her family history includes Arrhythmia in her mother; Colon polyps in her mother; Diabetes in her father; Hypertension in her brother, father, and mother.She reports that she has never smoked. She has never used smokeless tobacco. She reports that she does not drink alcohol  or use illicit drugs.  Outpatient Prescriptions Prior to Visit  Medication Sig Dispense Refill  . ALPRAZolam (XANAX) 0.5 MG tablet TAKE 2 TABLETS BY MOUTH TWICE A DAY AS NEEDED FOR SLEEP OR ANXIETY 60 tablet 5  . amitriptyline (ELAVIL) 100 MG tablet TAKE ONE TABLET BY MOUTH AT BEDTIME 90 tablet 0  . cetirizine (ZYRTEC) 10 MG tablet Take 10 mg by mouth daily.      . Lactobacillus (PROBIOTIC ACIDOPHILUS PO) Take 1 tablet by mouth daily.    Marland Kitchen losartan (COZAAR) 100 MG tablet TAKE 1 TABLET (100 MG TOTAL) BY MOUTH DAILY. 90 tablet 1  . Multiple Vitamin (MULTIVITAMIN) capsule Take 1 capsule by mouth daily.    Marland Kitchen omeprazole (PRILOSEC) 40 MG capsule TAKE 1 CAPSULE EVERY DAY 30 capsule 5  . spironolactone (ALDACTONE) 25 MG tablet Take 1 tablet (25 mg total) by mouth daily. 90 tablet 1  . zolpidem (AMBIEN CR) 12.5 MG CR tablet TAKE 1 TABLET AT BEDTIME AS NEEDED FOR SLEEP 30 tablet 5  . pramoxine-hydrocortisone (PROCTOCREAM-HC) 1-1 % rectal cream Place 1 application rectally 2 (two) times daily. 30 g 0  . phentermine (ADIPEX-P) 37.5 MG tablet Take 1 tablet (37.5 mg total) by mouth daily before breakfast. (Patient not taking: Reported on 12/16/2014) 30 tablet 2  . valACYclovir (VALTREX) 1000 MG tablet Take 1 tablet (1,000 mg total) by mouth 2 (two) times daily. (Patient not taking: Reported on 11/09/2014) 10 tablet 0   No facility-administered medications prior to visit.    Review of Systems   Patient denies headache, fevers, malaise, unintentional weight loss, skin rash, eye pain, sinus congestion and sinus pain, sore throat, dysphagia,  hemoptysis , cough, dyspnea, wheezing, chest pain, palpitations, orthopnea, edema, abdominal pain, nausea, melena, diarrhea, constipation, flank pain, dysuria, hematuria, urinary  Frequency, nocturia, numbness, tingling, seizures,  Focal weakness, Loss of consciousness,  Tremor, insomnia, depression, anxiety, and suicidal ideation.      Objective:  BP 110/76 mmHg  Pulse  99  Temp(Src) 98.6 F (37 C) (Oral)  Resp 12  Ht 5\' 9"  (1.753 m)  Wt 190 lb (86.183 kg)  BMI 28.05 kg/m2  SpO2 98%  LMP 05/01/2014 (Approximate)  Physical Exam   General appearance: alert, cooperative and appears stated age Head: Normocephalic, without obvious abnormality, atraumatic Eyes: conjunctivae/corneas clear. PERRL, EOM's intact. Fundi benign. Ears: normal TM's and external ear canals both ears Nose: Nares normal. Septum midline. Mucosa normal. No drainage or sinus tenderness. Throat: lips, mucosa, and tongue normal; teeth and gums normal Neck: no adenopathy, no carotid bruit, no JVD, supple, symmetrical, trachea midline and thyroid not enlarged, symmetric, no tenderness/mass/nodules Lungs: clear to auscultation bilaterally Breasts: normal appearance, no masses or tenderness Heart: regular rate and rhythm, S1, S2 normal, no murmur, click, rub or gallop Abdomen: soft, non-tender; bowel sounds normal; no masses,  no organomegaly Extremities: extremities normal, atraumatic, no cyanosis or edema Pulses: 2+ and symmetric Skin: 1 cm sq uare macular hyperpigmented macule left ler cheek . o rashes or lesions Neurologic: Alert and oriented X 3, normal strength and tone. Normal symmetric reflexes. Normal coordination and gait.     Assessment & Plan:   Problem List Items Addressed This Visit  Unprioritized   Routine general medical examination at a health care facility    Annual wellness  exam was done as well as a comprehensive physical exam  .  During the course of the visit the patient was educated and counseled about appropriate screening and preventive services and screenings were brought up to date for cervical and breast cancer .  She will return for fasting labs to provide samples for diabetes screening and lipid analysis with projected  10 year  risk for CAD. nutrition counseling, skin cancer screening has been recommended, along with review of the age appropriate  recommended immunizations.  Printed recommendations for health maintenance screenings was given.        Overweight (BMI 25.0-29.9)    I have addressed  BMI and recommended wt loss of 10% of body weight over the next 6 months using a low fat diet  diet and regular exercise a minimum of 5 days per week.        Essential hypertension, benign    Well controlled on current regimen. Renal function stable, no changes today.  Lab Results  Component Value Date   CREATININE 0.78 12/16/2014   Lab Results  Component Value Date   NA 139 12/16/2014   K 4.6 12/16/2014   CL 103 12/16/2014   CO2 27 12/16/2014         RESOLVED: Acute pharyngitis   Family history of FAP (familial adenomatous polyposis)    Patient had a screening colonoscopy at age 57 by Linton Ham for recent diagnosis of FAP in mother. I do not have the report ,  But she was told to follow up in 10 years, earlier if she has a change in bowel habits.      Herpes zoster    Left side of face and chin, treated and now resolved.       Hyperlipemia    Other Visit Diagnoses    Abnormal thyroid blood test    -  Primary    Relevant Orders    T3, free (Completed)    T4, free (Completed)       I have discontinued Ms. Yamaguchi's phentermine, valACYclovir, and pramoxine-hydrocortisone. I am also having her maintain her cetirizine, multivitamin, Lactobacillus (PROBIOTIC ACIDOPHILUS PO), losartan, spironolactone, omeprazole, zolpidem, ALPRAZolam, and amitriptyline.  No orders of the defined types were placed in this encounter.    Medications Discontinued During This Encounter  Medication Reason  . valACYclovir (VALTREX) 1000 MG tablet Completed Course  . pramoxine-hydrocortisone (PROCTOCREAM-HC) 1-1 % rectal cream Completed Course  . phentermine (ADIPEX-P) 37.5 MG tablet     Follow-up: No Follow-up on file.   Crecencio Mc, MD

## 2014-12-16 NOTE — Patient Instructions (Signed)
The  diet I discussed with you today is the 10 day Green Smoothie Cleansing /Detox Diet by Linden Dolin . available on [employer] for around $10.  This is not a low carb or a weight loss diet,  It is fundamentally a "cleansing" low fat diet that eliminates sugar, gluten, caffeine, alcohol and dairy for 10 days .  What you add back after the initial ten days is entirely up to  you!  You can expect to lose 5 to 10 lbs depending on how strict you are.   I suggest drinking 2 smoothies daily and keeping one chewable meal (but keep it simple, like baked fish and salad, rice or bok choy) .  You snack primarily on fresh  fruit, egg whites and judicious quantities of nuts. You can add vegetable based protein powder (nothing with whey , since whey is dairy) in it.  [employer] has a [employer], political party .  I suggest using /drinking 2 smoothies daily and have one sensible low fat meal ,  The snacks are primarily fruit, egg whites and nuts.   It does require some form of a nutrient extractor (Vita Mix, a electric juicer,  Or a Nutribullet Rx).  i have found that using frozen fruits is much more convenient and cost effective. You can even find plenty of organic fruit in the frozen fruit section of [employer].  Just thaw what you need for the following day the night before in the refrigerator (to avoid jamming up your machine)    Health Maintenance Adopting a healthy lifestyle and getting preventive care can go a long way to promote health and wellness. Talk with your health care provider about what schedule of regular examinations is right for you. This is a good chance for you to check in with your provider about disease prevention and staying healthy. In between checkups, there are plenty of things you can do on your own. Experts have done a lot of research about which lifestyle changes and preventive measures are most likely to keep you healthy. Ask your health care provider for more information. WEIGHT AND DIET  Eat a healthy  diet  Be sure to include plenty of vegetables, fruits, low-fat dairy products, and lean protein.  Do not eat a lot of foods high in solid fats, added sugars, or salt.  Get regular exercise. This is one of the most important things you can do for your health.  Most adults should exercise for at least 150 minutes each week. The exercise should increase your heart rate and make you sweat (moderate-intensity exercise).  Most adults should also do strengthening exercises at least twice a week. This is in addition to the moderate-intensity exercise.  Maintain a healthy weight  Body mass index (BMI) is a measurement that can be used to identify possible weight problems. It estimates body fat based on height and weight. Your health care provider can help determine your BMI and help you achieve or maintain a healthy weight.  For females 12 years of age and older:   A BMI below 18.5 is considered underweight.  A BMI of 18.5 to 24.9 is normal.  A BMI of 25 to 29.9 is considered overweight.  A BMI of 30 and above is considered obese.  Watch levels of cholesterol and blood lipids  You should start having your blood tested for lipids and cholesterol at 51 years of age, then have this test every 5 years.  You may need to have your cholesterol levels checked  more often if:  Your lipid or cholesterol levels are high.  You are older than 51 years of age.  You are at high risk for heart disease.  CANCER SCREENING   Lung Cancer  Lung cancer screening is recommended for adults 68-52 years old who are at high risk for lung cancer because of a history of smoking.  A yearly low-dose CT scan of the lungs is recommended for people who:  Currently smoke.  Have quit within the past 15 years.  Have at least a 30-pack-year history of smoking. A pack year is smoking an average of one pack of cigarettes a day for 1 year.  Yearly screening should continue until it has been 15 years since you  quit.  Yearly screening should stop if you develop a health problem that would prevent you from having lung cancer treatment.  Breast Cancer  Practice breast self-awareness. This means understanding how your breasts normally appear and feel.  It also means doing regular breast self-exams. Let your health care provider know about any changes, no matter how small.  If you are in your 20s or 30s, you should have a clinical breast exam (CBE) by a health care provider every 1-3 years as part of a regular health exam.  If you are 52 or older, have a CBE every year. Also consider having a breast X-ray (mammogram) every year.  If you have a family history of breast cancer, talk to your health care provider about genetic screening.  If you are at high risk for breast cancer, talk to your health care provider about having an MRI and a mammogram every year.  Breast cancer gene (BRCA) assessment is recommended for women who have family members with BRCA-related cancers. BRCA-related cancers include:  Breast.  Ovarian.  Tubal.  Peritoneal cancers.  Results of the assessment will determine the need for genetic counseling and BRCA1 and BRCA2 testing. Cervical Cancer Routine pelvic examinations to screen for cervical cancer are no longer recommended for nonpregnant women who are considered low risk for cancer of the pelvic organs (ovaries, uterus, and vagina) and who do not have symptoms. A pelvic examination may be necessary if you have symptoms including those associated with pelvic infections. Ask your health care provider if a screening pelvic exam is right for you.   The Pap test is the screening test for cervical cancer for women who are considered at risk.  If you had a hysterectomy for a problem that was not cancer or a condition that could lead to cancer, then you no longer need Pap tests.  If you are older than 65 years, and you have had normal Pap tests for the past 10 years, you no  longer need to have Pap tests.  If you have had past treatment for cervical cancer or a condition that could lead to cancer, you need Pap tests and screening for cancer for at least 20 years after your treatment.  If you no longer get a Pap test, assess your risk factors if they change (such as having a new sexual partner). This can affect whether you should start being screened again.  Some women have medical problems that increase their chance of getting cervical cancer. If this is the case for you, your health care provider may recommend more frequent screening and Pap tests.  The human papillomavirus (HPV) test is another test that may be used for cervical cancer screening. The HPV test looks for the virus that can cause cell  changes in the cervix. The cells collected during the Pap test can be tested for HPV.  The HPV test can be used to screen women 36 years of age and older. Getting tested for HPV can extend the interval between normal Pap tests from three to five years.  An HPV test also should be used to screen women of any age who have unclear Pap test results.  After 51 years of age, women should have HPV testing as often as Pap tests.  Colorectal Cancer  This type of cancer can be detected and often prevented.  Routine colorectal cancer screening usually begins at 51 years of age and continues through 51 years of age.  Your health care provider may recommend screening at an earlier age if you have risk factors for colon cancer.  Your health care provider may also recommend using home test kits to check for hidden blood in the stool.  A small camera at the end of a tube can be used to examine your colon directly (sigmoidoscopy or colonoscopy). This is done to check for the earliest forms of colorectal cancer.  Routine screening usually begins at age 20.  Direct examination of the colon should be repeated every 5-10 years through 51 years of age. However, you may need to be  screened more often if early forms of precancerous polyps or small growths are found. Skin Cancer  Check your skin from head to toe regularly.  Tell your health care provider about any new moles or changes in moles, especially if there is a change in a mole's shape or color.  Also tell your health care provider if you have a mole that is larger than the size of a pencil eraser.  Always use sunscreen. Apply sunscreen liberally and repeatedly throughout the day.  Protect yourself by wearing long sleeves, pants, a wide-brimmed hat, and sunglasses whenever you are outside. HEART DISEASE, DIABETES, AND HIGH BLOOD PRESSURE   Have your blood pressure checked at least every 1-2 years. High blood pressure causes heart disease and increases the risk of stroke.  If you are between 60 years and 35 years old, ask your health care provider if you should take aspirin to prevent strokes.  Have regular diabetes screenings. This involves taking a blood sample to check your fasting blood sugar level.  If you are at a normal weight and have a low risk for diabetes, have this test once every three years after 51 years of age.  If you are overweight and have a high risk for diabetes, consider being tested at a younger age or more often. PREVENTING INFECTION  Hepatitis B  If you have a higher risk for hepatitis B, you should be screened for this virus. You are considered at high risk for hepatitis B if:  You were born in a country where hepatitis B is common. Ask your health care provider which countries are considered high risk.  Your parents were born in a high-risk country, and you have not been immunized against hepatitis B (hepatitis B vaccine).  You have HIV or AIDS.  You use needles to inject street drugs.  You live with someone who has hepatitis B.  You have had sex with someone who has hepatitis B.  You get hemodialysis treatment.  You take certain medicines for conditions, including  cancer, organ transplantation, and autoimmune conditions. Hepatitis C  Blood testing is recommended for:  Everyone born from [birth year] through 1967.  Anyone with known risk factors for hepatitis  C. Sexually transmitted infections (STIs)  You should be screened for sexually transmitted infections (STIs) including gonorrhea and chlamydia if:  You are sexually active and are younger than 51 years of age.  You are older than 51 years of age and your health care provider tells you that you are at risk for this type of infection.  Your sexual activity has changed since you were last screened and you are at an increased risk for chlamydia or gonorrhea. Ask your health care provider if you are at risk.  If you do not have HIV, but are at risk, it may be recommended that you take a prescription medicine daily to prevent HIV infection. This is called pre-exposure prophylaxis (PrEP). You are considered at risk if:  You are sexually active and do not regularly use condoms or know the HIV status of your partner(s).  You take drugs by injection.  You are sexually active with a partner who has HIV. Talk with your health care provider about whether you are at high risk of being infected with HIV. If you choose to begin PrEP, you should first be tested for HIV. You should then be tested every 3 months for as long as you are taking PrEP.  PREGNANCY   If you are premenopausal and you may become pregnant, ask your health care provider about preconception counseling.  If you may become pregnant, take 400 to 800 micrograms (mcg) of folic acid every day.  If you want to prevent pregnancy, talk to your health care provider about birth control (contraception). OSTEOPOROSIS AND MENOPAUSE   Osteoporosis is a disease in which the bones lose minerals and strength with aging. This can result in serious bone fractures. Your risk for osteoporosis can be identified using a bone density scan.  If you are 35 years of  age or older, or if you are at risk for osteoporosis and fractures, ask your health care provider if you should be screened.  Ask your health care provider whether you should take a calcium or vitamin D supplement to lower your risk for osteoporosis.  Menopause may have certain physical symptoms and risks.  Hormone replacement therapy may reduce some of these symptoms and risks. Talk to your health care provider about whether hormone replacement therapy is right for you.  HOME CARE INSTRUCTIONS   Schedule regular health, dental, and eye exams.  Stay current with your immunizations.   Do not use any tobacco products including cigarettes, chewing tobacco, or electronic cigarettes.  If you are pregnant, do not drink alcohol.  If you are breastfeeding, limit how much and how often you drink alcohol.  Limit alcohol intake to no more than 1 drink per day for nonpregnant women. One drink equals 12 ounces of beer, 5 ounces of wine, or 1 ounces of hard liquor.  Do not use street drugs.  Do not share needles.  Ask your health care provider for help if you need support or information about quitting drugs.  Tell your health care provider if you often feel depressed.  Tell your health care provider if you have ever been abused or do not feel safe at home. Document Released: 12/05/2010 Document Revised: 10/06/2013 Document Reviewed: 04/23/2013 [facility] Patient Information 2015 [place], [place]. This information is not intended to replace advice given to you by your health care provider. Make sure you discuss any questions you have with your health care provider.

## 2014-12-19 NOTE — Assessment & Plan Note (Signed)

## 2014-12-19 NOTE — Assessment & Plan Note (Signed)
I have addressed  BMI and recommended wt loss of 10% of body weight over the next 6 months using a low fat diet  diet and regular exercise a minimum of 5 days per week.

## 2014-12-19 NOTE — Assessment & Plan Note (Signed)
Left side of face and chin, treated and now resolved.

## 2014-12-19 NOTE — Assessment & Plan Note (Addendum)
Patient had a screening colonoscopy at age 49 by Linton Ham for recent diagnosis of FAP in mother. I do not have the report ,  But she was told to follow up in 10 years, earlier if she has a change in bowel habits.

## 2014-12-19 NOTE — Assessment & Plan Note (Signed)
Well controlled on current regimen. Renal function stable, no changes today.  Lab Results  Component Value Date   CREATININE 0.78 12/16/2014   Lab Results  Component Value Date   NA 139 12/16/2014   K 4.6 12/16/2014   CL 103 12/16/2014   CO2 27 12/16/2014

## 2014-12-20 ENCOUNTER — Encounter: Payer: Self-pay | Admitting: Internal Medicine

## 2015-01-29 ENCOUNTER — Encounter: Payer: Self-pay | Admitting: Family Medicine

## 2015-01-29 ENCOUNTER — Ambulatory Visit (INDEPENDENT_AMBULATORY_CARE_PROVIDER_SITE_OTHER): Payer: BLUE CROSS/BLUE SHIELD | Admitting: Family Medicine

## 2015-01-29 VITALS — BP 130/82 | HR 105 | Temp 98.3°F | Ht 69.0 in | Wt 186.4 lb

## 2015-01-29 DIAGNOSIS — K1370 Unspecified lesions of oral mucosa: Secondary | ICD-10-CM | POA: Diagnosis not present

## 2015-01-29 NOTE — Patient Instructions (Signed)
It was nice to see you today.  This is not shingles or anything that you need to be concerned about.  It is likely from trauma/biting.  Follow up as needed.  Take care  Dr. Lacinda Axon

## 2015-01-29 NOTE — Progress Notes (Signed)
   Subjective:  Patient ID: Dawn Griffith, female    DOB: November 04, 1965  Age: 49 y.o. MRN: [id number]  CC: Oral lesion  HPI:  49 year old female presents for evaluation of an oral lesion.  1) Oral lesion  Patient noticed an sore/lesion on her lower lip (inside) this past Wednesday.  No known inciting factor; however, she notes increase stress.  No recent trauma or injury.  No fever, chills, sick contacts.  She does reports a history of shingles and is concerned that this is the source of her oral lesion.  No exacerbating or relieving factors.  No associated pain.  Social Hx - Nonsmoker.  Review of Systems  Constitutional: Negative for fever and chills.  HENT: Positive for mouth sores.     Objective:  BP 130/82 mmHg  Pulse 105  Temp(Src) 98.3 F (36.8 C) (Oral)  Ht 5\' 9"  (1.753 m)  Wt 186 lb 6 oz (84.539 kg)  BMI 27.51 kg/m2  SpO2 97%  LMP 05/01/2014 (Approximate)  BP/Weight 01/29/2015 06/19/7260 0/08/5595  Systolic BP 416 384 536  Diastolic BP 82 76 64  Wt. (Lbs) 186.38 190 191  BMI 27.51 28.05 28.19   Physical Exam  Constitutional: She is oriented to person, place, and time. She appears well-developed and well-nourished. No distress.  HENT:  Head: Normocephalic and atraumatic.  Lower lip with a white, raised lesion. No ulceration. Not vesicular.    Cardiovascular: Normal rate and regular rhythm.   Pulmonary/Chest: Effort normal and breath sounds normal. No respiratory distress. She has no wheezes. She has no rales.  Neurological: She is alert and oriented to person, place, and time.  Psychiatric:  Anxious.    Lab Results  Component Value Date   WBC 6.8 12/16/2014   HGB 13.7 12/16/2014   HCT 41.0 12/16/2014   PLT 248.0 12/16/2014   GLUCOSE 90 12/16/2014   CHOL 249* 12/16/2014   TRIG 157.0* 12/16/2014   HDL 48.40 12/16/2014   LDLDIRECT 173.9 04/06/2011   LDLCALC 169* 12/16/2014   ALT 13 12/16/2014   AST 14 12/16/2014   NA 139 12/16/2014   K 4.6  12/16/2014   CL 103 12/16/2014   CREATININE 0.78 12/16/2014   BUN 13 12/16/2014   CO2 27 12/16/2014   TSH 0.84 12/16/2014    Assessment & Plan:   Problem List Items Addressed This Visit    Oral mucosal lesion - Primary    Appears traumatic in origin. Patient reassured. Advised supportive care.         Follow-up: PRN   Thersa Salt, DO

## 2015-01-29 NOTE — Progress Notes (Signed)
Pre visit review using our clinic review tool, if applicable. No additional management support is needed unless otherwise documented below in the visit note. 

## 2015-01-29 NOTE — Assessment & Plan Note (Signed)
Appears traumatic in origin. Patient reassured. Advised supportive care.

## 2015-02-22 ENCOUNTER — Telehealth: Payer: Self-pay | Admitting: Internal Medicine

## 2015-02-22 ENCOUNTER — Ambulatory Visit (INDEPENDENT_AMBULATORY_CARE_PROVIDER_SITE_OTHER): Payer: BLUE CROSS/BLUE SHIELD | Admitting: Family Medicine

## 2015-02-22 ENCOUNTER — Encounter: Payer: Self-pay | Admitting: Family Medicine

## 2015-02-22 VITALS — BP 118/72 | HR 90 | Temp 99.1°F | Ht 69.0 in

## 2015-02-22 DIAGNOSIS — Z23 Encounter for immunization: Secondary | ICD-10-CM | POA: Diagnosis not present

## 2015-02-22 DIAGNOSIS — M25552 Pain in left hip: Secondary | ICD-10-CM | POA: Diagnosis not present

## 2015-02-22 LAB — POCT URINALYSIS DIPSTICK
Bilirubin, UA: NEGATIVE
GLUCOSE UA: NEGATIVE
Ketones, UA: NEGATIVE
Leukocytes, UA: NEGATIVE
NITRITE UA: NEGATIVE
PH UA: 6.5
PROTEIN UA: NEGATIVE
RBC UA: NEGATIVE
SPEC GRAV UA: 1.01
UROBILINOGEN UA: 0.2

## 2015-02-22 LAB — POCT URINE PREGNANCY: PREG TEST UR: NEGATIVE

## 2015-02-22 NOTE — Progress Notes (Signed)
Pre visit review using our clinic review tool, if applicable. No additional management support is needed unless otherwise documented below in the visit note. 

## 2015-02-22 NOTE — Telephone Encounter (Signed)
Pt scheduled with Dr Caryl Bis

## 2015-02-22 NOTE — Telephone Encounter (Signed)
Pt states that her Left Hip is paining her going down to groin area. No appt avail. Let me know where to sch. Pt wants to see Dr. Derrel Nip only. Thank You!

## 2015-02-22 NOTE — Patient Instructions (Signed)
Nice to meet you. Your discomfort is likely related to your hip joint.  You can take ibuprofen 600 mg every 8 hours as needed for pain.  Please use heat on the area.  If this continues to be an issue please let us know and we can obtain an x-ray. If you develop fever, numbness, weakness, abdominal pain, nausea, vomiting, diarrhea, loss of bowel or bladder function, or back pain please seek medical attention.

## 2015-02-23 DIAGNOSIS — M25552 Pain in left hip: Secondary | ICD-10-CM | POA: Insufficient documentation

## 2015-02-23 NOTE — Progress Notes (Signed)
Patient ID: Dawn Griffith, female   DOB: 1966/05/01, 49 y.o.   MRN: [id number]  Dawn Rumps, MD Phone: [phone]  Dawn Griffith is a 49 y.o. female who presents today for same day appointment.   Left hip pain: patient notes intermittent left hip pain starting in her lateral left buttock and moving to her anterior hip and groin.She notes it is mild pain. No tenderness. No skin changes. No history of injury. No particular movements make it worse. No back pain, numbness, weakness, saddle anesthesia, incontinence, fever, abdominal pain, nausea, vomiting, diarrhea, dysuria, frequency, urgency, or vaginal bleeding/discharge. Pain is sometimes caused by sitting down. Patient has no pain at this time.  At the end of the visit the patient asked if this could be her ovaries. I advised that given the location and lack of abdominal pain this would be less likely, though we could do a pelvic exam to evaluate her adnexal region and uterus. She was agreeable to this.   PMH: nonsmoker.    ROS see HPI  Objective  Physical Exam Filed Vitals:   02/22/15 1504  BP: 118/72  Pulse: 90  Temp: 99.1 F (37.3 C)    BP Readings from Last 3 Encounters:  02/22/15 118/72  01/29/15 130/82  12/16/14 110/76   Wt Readings from Last 3 Encounters:  01/29/15 186 lb 6 oz (84.539 kg)  12/16/14 190 lb (86.183 kg)  11/09/14 191 lb (86.637 kg)    Physical Exam  Constitutional: She is well-developed, well-nourished, and in no distress.  HENT:  Head: Normocephalic and atraumatic.  Cardiovascular: Normal rate, regular rhythm and normal heart sounds.  Exam reveals no gallop and no friction rub.   No murmur heard. Pulmonary/Chest: Effort normal and breath sounds normal. No respiratory distress. She has no wheezes. She has no rales.  Abdominal: Soft. Bowel sounds are normal. She exhibits no distension. There is no tenderness. There is no rebound and no guarding.  Genitourinary: Vagina normal, uterus normal, cervix  normal, right adnexa normal and left adnexa normal.  Normal labia, no cervical motion tenderness, no adnexal masses or tenderness  Musculoskeletal:  No midline spine tenderness, no muscular tenderness, full bilateral hip ROM with no pain, no tenderness of either buttock, no tenderness of the bilateral posterior, lateral, or anterior hips, no skin changes, no palpable masses or swelling, feet WWP  Neurological: She is alert.  5/5 strength in bilateral quads, hamstrings, plantar and dorsiflexion, sensation to light touch intact in bilateral LE, normal gait, 2+ patellar reflexes  Skin: Skin is warm and dry. She is not diaphoretic.     Assessment/Plan: Please see individual problem list.  Left hip pain Patient with pain in left buttock radiating to anterior hip. Possibly OA vs nerve impingement vs muscular strain. Benign abdominal and pelvic exams and lack of abdominal and pelvic pain makes intraabdominal pathology unlikely. UA and Upreg negative for cause. Is neurovascularly intact. Discussed possible causes with patient. Discussed XR imaging of the hip to evaluate further, though patient declined opting to monitor this issue prior to further work up. Advised on ibuprofen for pain. Given return precautions.     Orders Placed This Encounter  Procedures  . Flu Vaccine QUAD 36+ mos IM  . POCT Urinalysis Dipstick  . POCT urine pregnancy   Dawn Griffith

## 2015-02-23 NOTE — Assessment & Plan Note (Addendum)
Patient with pain in left buttock radiating to anterior hip. Possibly OA vs nerve impingement vs muscular strain. Benign abdominal and pelvic exams and lack of abdominal and pelvic pain makes intraabdominal pathology unlikely. UA and Upreg negative for cause. Is neurovascularly intact. Discussed possible causes with patient. Discussed XR imaging of the hip to evaluate further, though patient declined opting to monitor this issue prior to further work up. Advised on ibuprofen for pain. Given return precautions.

## 2015-03-05 ENCOUNTER — Other Ambulatory Visit: Payer: Self-pay | Admitting: Internal Medicine

## 2015-03-11 ENCOUNTER — Other Ambulatory Visit: Payer: Self-pay | Admitting: Internal Medicine

## 2015-03-24 ENCOUNTER — Ambulatory Visit: Payer: BC Managed Care – PPO | Admitting: Endocrinology

## 2015-03-29 ENCOUNTER — Ambulatory Visit (INDEPENDENT_AMBULATORY_CARE_PROVIDER_SITE_OTHER): Payer: BLUE CROSS/BLUE SHIELD | Admitting: Internal Medicine

## 2015-03-29 ENCOUNTER — Encounter: Payer: Self-pay | Admitting: Internal Medicine

## 2015-03-29 VITALS — BP 126/82 | HR 92 | Temp 98.6°F | Resp 12 | Ht 69.0 in | Wt 190.4 lb

## 2015-03-29 DIAGNOSIS — E049 Nontoxic goiter, unspecified: Secondary | ICD-10-CM | POA: Diagnosis not present

## 2015-03-29 DIAGNOSIS — R946 Abnormal results of thyroid function studies: Secondary | ICD-10-CM

## 2015-03-29 DIAGNOSIS — E040 Nontoxic diffuse goiter: Secondary | ICD-10-CM

## 2015-03-29 DIAGNOSIS — M25552 Pain in left hip: Secondary | ICD-10-CM

## 2015-03-29 DIAGNOSIS — R7989 Other specified abnormal findings of blood chemistry: Secondary | ICD-10-CM

## 2015-03-29 MED ORDER — MELOXICAM 15 MG PO TABS: 15.0000 mg | ORAL_TABLET | Freq: Every day | ORAL | Status: DC

## 2015-03-29 MED ORDER — ZOSTER VACCINE LIVE 19400 UNT/0.65ML ~~LOC~~ SOLR: 0.6500 mL | Freq: Once | SUBCUTANEOUS | Status: DC

## 2015-03-29 MED ORDER — CYCLOBENZAPRINE HCL 10 MG PO TABS: 10.0000 mg | ORAL_TABLET | Freq: Three times a day (TID) | ORAL | Status: DC | PRN

## 2015-03-29 NOTE — Progress Notes (Signed)
Pre-visit discussion using our clinic review tool. No additional management support is needed unless otherwise documented below in the visit note.  

## 2015-03-29 NOTE — Progress Notes (Signed)
Subjective:  Patient ID: Dawn Griffith, female    DOB: [birth year]  Age: 49 y.o. MRN: [id number]  CC: The primary encounter diagnosis was Goiter. Diagnoses of Goiter diffuse, Abnormal thyroid blood test, and Left hip pain were also pertinent to this visit.  HPI  Dawn Griffith presents for follow up on several issues.  She was referred to [facility] [place] for evaluation of goiter and persistently abnormal thyroid studies .  Her thyroid function was repeated and was now normal.   The abnormal studies were attributed to Hashimoto's thyroditis.  She has not had her annual thyroid ultrasound scheduled yet.    2( Left inguinal pain: She was evaluated one month ago for left hip pain which started in her mid gluteal rea and radiated to anterior hip and groin .  Was concerned about her ovaries as the source and had a pelvic exam which did not reproduce the pain and was essentially a normal exam .   She deferred imaging  of hip. ,  The pain in the buttock has improved but she continues to report mild Left inguinal pain.  Aggravated by climbing stairs,  but very mild.   Outpatient Prescriptions Prior to Visit  Medication Sig Dispense Refill  . ALPRAZolam (XANAX) 0.5 MG tablet TAKE 2 TABLETS TWICE A DAY AS NEEDED FORSLEEP OR ANXIETY 60 tablet 1  . amitriptyline (ELAVIL) 100 MG tablet TAKE ONE TABLET BY MOUTH AT BEDTIME 90 tablet 0  . cetirizine (ZYRTEC) 10 MG tablet Take 10 mg by mouth daily.      . Lactobacillus (PROBIOTIC ACIDOPHILUS PO) Take 1 tablet by mouth daily.    Marland Kitchen losartan (COZAAR) 100 MG tablet TAKE 1 TABLET (100 MG TOTAL) BY MOUTH DAILY. 90 tablet 1  . Multiple Vitamin (MULTIVITAMIN) capsule Take 1 capsule by mouth daily.    Marland Kitchen omeprazole (PRILOSEC) 40 MG capsule TAKE 1 CAPSULE EVERY DAY 30 capsule 5  . spironolactone (ALDACTONE) 25 MG tablet TAKE ONE TABLET BY MOUTH EVERY DAY 90 tablet 3  . zolpidem (AMBIEN CR) 12.5 MG CR tablet TAKE ONE TABLET BY MOUTH AT BEDTIME AS NEEDED  FOR SLEEP 30 tablet 3   No facility-administered medications prior to visit.    Review of Systems;  Patient denies headache, fevers, malaise, unintentional weight loss, skin rash, eye pain, sinus congestion and sinus pain, sore throat, dysphagia,  hemoptysis , cough, dyspnea, wheezing, chest pain, palpitations, orthopnea, edema, abdominal pain, nausea, melena, diarrhea, constipation, flank pain, dysuria, hematuria, urinary  Frequency, nocturia, numbness, tingling, seizures,  Focal weakness, Loss of consciousness,  Tremor, insomnia, depression, anxiety, and suicidal ideation.      Objective:  BP 126/82 mmHg  Pulse 92  Temp(Src) 98.6 F (37 C) (Oral)  Resp 12  Ht 5\' 9"  (1.753 m)  Wt 190 lb 6 oz (86.354 kg)  BMI 28.10 kg/m2  SpO2 98%  BP Readings from Last 3 Encounters:  03/29/15 126/82  02/22/15 118/72  01/29/15 130/82    Wt Readings from Last 3 Encounters:  03/29/15 190 lb 6 oz (86.354 kg)  01/29/15 186 lb 6 oz (84.539 kg)  12/16/14 190 lb (86.183 kg)    General appearance: alert, cooperative and appears stated age Neck: no adenopathy, no carotid bruit, supple, symmetrical, trachea midline, thyroid diffusely enlarged, symmetric,  C/w goiter Back: symmetric, no curvature. ROM normal. No CVA tenderness. Lungs: clear to auscultation bilaterally Heart: regular rate and rhythm, S1, S2 normal, no murmur, click, rub or gallop Abdomen: soft,  non-tender; bowel sounds normal; no masses,  no organomegaly Pulses: 2+ and symmetric MSK: normal ROM both hips without pain  Skin: Skin color, texture, turgor normal. No rashes or lesions Lymph nodes: Cervical, supraclavicular, and axillary nodes normal.  No results found for: HGBA1C  Lab Results  Component Value Date   CREATININE 0.78 12/16/2014   CREATININE 0.8 11/05/2013   CREATININE 0.7 11/04/2012    Lab Results  Component Value Date   WBC 6.8 12/16/2014   HGB 13.7 12/16/2014   HCT 41.0 12/16/2014   PLT 248.0 12/16/2014    GLUCOSE 90 12/16/2014   CHOL 249* 12/16/2014   TRIG 157.0* 12/16/2014   HDL 48.40 12/16/2014   LDLDIRECT 173.9 04/06/2011   LDLCALC 169* 12/16/2014   ALT 13 12/16/2014   AST 14 12/16/2014   NA 139 12/16/2014   K 4.6 12/16/2014   CL 103 12/16/2014   CREATININE 0.78 12/16/2014   BUN 13 12/16/2014   CO2 27 12/16/2014   TSH 0.84 12/16/2014    No results found.  Assessment & Plan:   Problem List Items Addressed This Visit    Goiter diffuse    Annual ultrasound has been ordered and patient will follow up with [facility]      Left hip pain    Now improved, mild and localized to inguinal area, suggesting pulled groin muscle.   NSAID,  prn flexeril,  Image if no improvement in 2 weeks       Abnormal thyroid blood test    Repeat testing done at [facility] earlier this month was normal.  Repeat annually or is she develops symptoms of hyperthyroidism,  No treatment advised  unless TSH is < 0.1       Other Visit Diagnoses    Goiter    -  Primary    Relevant Orders    US Soft Tissue Head/Neck      A total of 25 minutes of face to face time was spent with patient more than half of which was spent in counseling, reviewing recent  Evaluations by ENT and Endicrinology, and coordination of care  I am having Dawn Griffith start on meloxicam, cyclobenzaprine, and zoster vaccine live (PF). I am also having her maintain her cetirizine, multivitamin, Lactobacillus (PROBIOTIC ACIDOPHILUS PO), losartan, amitriptyline, omeprazole, zolpidem, ALPRAZolam, and spironolactone.  Meds ordered this encounter  Medications  . meloxicam (MOBIC) 15 MG tablet    Sig: Take 1 tablet (15 mg total) by mouth daily.    Dispense:  30 tablet    Refill:  3  . cyclobenzaprine (FLEXERIL) 10 MG tablet    Sig: Take 1 tablet (10 mg total) by mouth 3 (three) times daily as needed for muscle spasms.    Dispense:  90 tablet    Refill:  2  . zoster vaccine live, PF, (ZOSTAVAX) [id number] UNT/0.65ML injection    Sig: Inject  19,400 Units into the skin once.    Dispense:  1 each    Refill:  0    There are no discontinued medications.  Follow-up: No Follow-up on file.   Crecencio Mc, MD

## 2015-03-29 NOTE — Patient Instructions (Signed)
Your pain is coming from a groin muscle  You can use meloxicanm  once daily instead of motrin as your anti inflammatory   Add flexeril for muscle spasms.   If not resolved in 3 weeks,  Call to arrange x ray   A thyroid ultrasound has been ordered.

## 2015-03-30 ENCOUNTER — Encounter: Payer: Self-pay | Admitting: Internal Medicine

## 2015-03-30 DIAGNOSIS — R7989 Other specified abnormal findings of blood chemistry: Secondary | ICD-10-CM | POA: Insufficient documentation

## 2015-03-30 NOTE — Assessment & Plan Note (Signed)
Annual ultrasound has been ordered and patient will follow up with [facility]

## 2015-03-30 NOTE — Assessment & Plan Note (Signed)
Now improved, mild and localized to inguinal area, suggesting pulled groin muscle.   NSAID,  prn flexeril,  Image if no improvement in 2 weeks

## 2015-03-30 NOTE — Assessment & Plan Note (Addendum)
Repeat testing done at [facility] earlier this month was normal.  Repeat annually or is she develops symptoms of hyperthyroidism,  No treatment advised  unless TSH is < 0.1

## 2015-04-02 ENCOUNTER — Ambulatory Visit
Admission: RE | Admit: 2015-04-02 | Discharge: 2015-04-02 | Disposition: A | Discharge status: Home / Self Care | Payer: BLUE CROSS/BLUE SHIELD | Source: Ambulatory Visit | Attending: Internal Medicine | Admitting: Internal Medicine

## 2015-04-02 DIAGNOSIS — E049 Nontoxic goiter, unspecified: Secondary | ICD-10-CM | POA: Insufficient documentation

## 2015-04-04 ENCOUNTER — Encounter: Payer: Self-pay | Admitting: Internal Medicine

## 2015-05-10 ENCOUNTER — Other Ambulatory Visit: Payer: Self-pay | Admitting: Internal Medicine

## 2015-05-10 ENCOUNTER — Encounter: Payer: Self-pay | Admitting: Nurse Practitioner

## 2015-05-10 ENCOUNTER — Ambulatory Visit (INDEPENDENT_AMBULATORY_CARE_PROVIDER_SITE_OTHER): Payer: BLUE CROSS/BLUE SHIELD | Admitting: Nurse Practitioner

## 2015-05-10 VITALS — BP 118/78 | HR 108 | Temp 99.5°F | Resp 16 | Ht 69.0 in | Wt 197.2 lb

## 2015-05-10 DIAGNOSIS — J0111 Acute recurrent frontal sinusitis: Secondary | ICD-10-CM

## 2015-05-10 DIAGNOSIS — J0110 Acute frontal sinusitis, unspecified: Secondary | ICD-10-CM | POA: Insufficient documentation

## 2015-05-10 MED ORDER — HYDROCOD POLST-CPM POLST ER 10-8 MG/5ML PO SUER: 5.0000 mL | Freq: Every evening | ORAL | Status: DC | PRN

## 2015-05-10 MED ORDER — AMOXICILLIN-POT CLAVULANATE 875-125 MG PO TABS: 1.0000 | ORAL_TABLET | Freq: Two times a day (BID) | ORAL | Status: DC

## 2015-05-10 NOTE — Patient Instructions (Signed)

## 2015-05-10 NOTE — Progress Notes (Signed)
Patient ID: Dawn Griffith, female    DOB: May 02, 1966  Age: 49 y.o. MRN: [id number]  CC: Sinusitis   HPI Dawn Griffith presents for CC of sinusitis x 5 days.  1) Pt has symptoms of tachycardia, coughing- productive, rhinorrhea, sinus pressure,  Tooth pain- denies  Worsening pressure with forward bending- denies Bad taste in mouth- decrease in taste only    Treatment to date:  Zyrtec- not helpful   Coricidin- not helpful  Nasal rinsing- helpful   Sick contacts: None  [name] has a past medical history of Cancer ([place]) and Hypertension.   She has past surgical history that includes Cesarean section (1992); Ectopic pregnancy surgery (1997); Lymph node biopsy; Nasal sinus surgery; Colonoscopy; and Cholecystectomy (Dec 2012).   Her family history includes Arrhythmia in her mother; Colon polyps in her mother; Diabetes in her father; Hypertension in her brother, father, and mother.She reports that she has never smoked. She has never used smokeless tobacco. She reports that she does not drink alcohol or use illicit drugs.  Outpatient Prescriptions Prior to Visit  Medication Sig Dispense Refill  . ALPRAZolam (XANAX) 0.5 MG tablet TAKE 2 TABLETS TWICE A DAY AS NEEDED FORSLEEP OR ANXIETY 60 tablet 1  . amitriptyline (ELAVIL) 100 MG tablet TAKE ONE TABLET BY MOUTH AT BEDTIME 90 tablet 0  . cetirizine (ZYRTEC) 10 MG tablet Take 10 mg by mouth daily.      . cyclobenzaprine (FLEXERIL) 10 MG tablet Take 1 tablet (10 mg total) by mouth 3 (three) times daily as needed for muscle spasms. 90 tablet 2  . Lactobacillus (PROBIOTIC ACIDOPHILUS PO) Take 1 tablet by mouth daily.    Marland Kitchen losartan (COZAAR) 100 MG tablet TAKE 1 TABLET (100 MG TOTAL) BY MOUTH DAILY. 90 tablet 1  . meloxicam (MOBIC) 15 MG tablet Take 1 tablet (15 mg total) by mouth daily. 30 tablet 3  . Multiple Vitamin (MULTIVITAMIN) capsule Take 1 capsule by mouth daily.    Marland Kitchen omeprazole (PRILOSEC) 40 MG capsule TAKE 1 CAPSULE EVERY DAY 30  capsule 5  . spironolactone (ALDACTONE) 25 MG tablet TAKE ONE TABLET BY MOUTH EVERY DAY 90 tablet 3  . zolpidem (AMBIEN CR) 12.5 MG CR tablet TAKE ONE TABLET BY MOUTH AT BEDTIME AS NEEDED FOR SLEEP 30 tablet 3  . zoster vaccine live, PF, (ZOSTAVAX) [id number] UNT/0.65ML injection Inject 19,400 Units into the skin once. 1 each 0   No facility-administered medications prior to visit.    ROS Review of Systems  Constitutional: Positive for fatigue. Negative for fever, chills and diaphoresis.  HENT: Positive for congestion, postnasal drip, rhinorrhea, sinus pressure and sore throat. Negative for ear discharge, ear pain and sneezing.   Respiratory: Positive for cough. Negative for chest tightness, shortness of breath and wheezing.   Cardiovascular: Negative for chest pain, palpitations and leg swelling.  Gastrointestinal: Negative for nausea, vomiting and diarrhea.  Skin: Negative for rash.  Neurological: Positive for headaches. Negative for dizziness, weakness and numbness.  Psychiatric/Behavioral: The patient is not nervous/anxious.     Objective:  BP 118/78 mmHg  Pulse 108  Temp(Src) 99.5 F (37.5 C)  Resp 16  Ht 5\' 9"  (1.753 m)  Wt 197 lb 3.2 oz (89.449 kg)  BMI 29.11 kg/m2  SpO2 96%  Physical Exam  Constitutional: She is oriented to person, place, and time. She appears well-developed and well-nourished. No distress.  HENT:  Head: Normocephalic and atraumatic.  Right Ear: External ear normal.  Left Ear: External ear normal.  Cardiovascular: Regular rhythm and normal heart sounds.  Exam reveals no gallop and no friction rub.   No murmur heard. Slightly tachycardic  Pulmonary/Chest: Effort normal and breath sounds normal. No respiratory distress. She has no wheezes. She has no rales. She exhibits no tenderness.  Neurological: She is alert and oriented to person, place, and time. No cranial nerve deficit. She exhibits normal muscle tone. Coordination normal.  Skin: Skin is warm and  dry. No rash noted. She is not diaphoretic.  Psychiatric: She has a normal mood and affect. Her behavior is normal. Judgment and thought content normal.   Assessment & Plan:   Dawn Griffith was seen today for sinusitis.  Diagnoses and all orders for this visit:  Acute recurrent frontal sinusitis  Other orders -     amoxicillin-clavulanate (AUGMENTIN) 875-125 MG tablet; Take 1 tablet by mouth 2 (two) times daily. -     chlorpheniramine-HYDROcodone (TUSSIONEX PENNKINETIC ER) 10-8 MG/5ML SUER; Take 5 mLs by mouth at bedtime as needed for cough.  I am having Dawn Griffith start on amoxicillin-clavulanate and chlorpheniramine-HYDROcodone. I am also having her maintain her cetirizine, multivitamin, Lactobacillus (PROBIOTIC ACIDOPHILUS PO), losartan, amitriptyline, omeprazole, zolpidem, ALPRAZolam, spironolactone, meloxicam, cyclobenzaprine, and zoster vaccine live (PF).  Meds ordered this encounter  Medications  . amoxicillin-clavulanate (AUGMENTIN) 875-125 MG tablet    Sig: Take 1 tablet by mouth 2 (two) times daily.    Dispense:  14 tablet    Refill:  0    Order Specific Question:  Supervising Provider    Answer:  Deborra Medina L [[id number]]  . chlorpheniramine-HYDROcodone (TUSSIONEX PENNKINETIC ER) 10-8 MG/5ML SUER    Sig: Take 5 mLs by mouth at bedtime as needed for cough.    Dispense:  240 mL    Refill:  0    Order Specific Question:  Supervising Provider    Answer:  Crecencio Mc [[id number]]     Follow-up: Return if symptoms worsen or fail to improve.

## 2015-05-10 NOTE — Progress Notes (Signed)
Pre visit review using our clinic review tool, if applicable. No additional management support is needed unless otherwise documented below in the visit note. 

## 2015-05-10 NOTE — Assessment & Plan Note (Signed)
Will treat with augmentin twice daily x 7 days and Tussionex cough syrup. Instructions about the cough syrup given (don't drive or make important decisions, just rest). 1 tsp recommended. FU prn worsening/failure to improve.  Pt is on a probiotic daily

## 2015-06-07 ENCOUNTER — Other Ambulatory Visit: Payer: Self-pay | Admitting: Internal Medicine

## 2015-06-24 ENCOUNTER — Other Ambulatory Visit: Payer: Self-pay | Admitting: Internal Medicine

## 2015-06-24 NOTE — Telephone Encounter (Signed)
90 day supply authorized and sent   

## 2015-06-24 NOTE — Telephone Encounter (Signed)
Ok to refill Mobic? 

## 2015-07-13 ENCOUNTER — Other Ambulatory Visit: Payer: Self-pay | Admitting: Internal Medicine

## 2015-07-23 ENCOUNTER — Telehealth: Payer: Self-pay | Admitting: Internal Medicine

## 2015-07-23 NOTE — Telephone Encounter (Signed)
Was completed already this am.

## 2015-07-23 NOTE — Telephone Encounter (Signed)
Pt called to confirm that the pharmacy sent a request for her blood pressure medication. Pt states she only has 2 pills left. Thank you!

## 2015-08-02 ENCOUNTER — Other Ambulatory Visit: Payer: Self-pay | Admitting: Internal Medicine

## 2015-08-02 NOTE — Telephone Encounter (Signed)
Last OV 10/16 ok to fill?

## 2015-08-02 NOTE — Telephone Encounter (Signed)
Refilled in January 2017 with 90-days and one refill. Please advise?

## 2015-08-03 NOTE — Telephone Encounter (Signed)
refilled 

## 2015-08-03 NOTE — Telephone Encounter (Signed)
Refilled .  Needs 6 month follow up i April

## 2015-09-01 ENCOUNTER — Other Ambulatory Visit: Payer: Self-pay | Admitting: Internal Medicine

## 2015-09-03 ENCOUNTER — Other Ambulatory Visit: Payer: Self-pay | Admitting: Internal Medicine

## 2015-09-03 NOTE — Telephone Encounter (Signed)
?  OK TO REFILL °

## 2015-09-03 NOTE — Telephone Encounter (Signed)
refilled 08/03/15 with 30 tablets and one refill. Please advise?

## 2015-09-16 ENCOUNTER — Other Ambulatory Visit: Payer: Self-pay | Admitting: Internal Medicine

## 2015-09-18 NOTE — Telephone Encounter (Signed)
Refill for 30 days only.  OFFICE VISIT NEEDED prior to any more refills 

## 2015-10-23 ENCOUNTER — Other Ambulatory Visit: Payer: Self-pay | Admitting: Internal Medicine

## 2015-10-25 ENCOUNTER — Other Ambulatory Visit: Payer: Self-pay | Admitting: Internal Medicine

## 2015-10-25 NOTE — Telephone Encounter (Signed)
Xanax refill request.  Last seen 05/2015, last filled 09/18/2015.  Please adivse.

## 2015-10-26 NOTE — Telephone Encounter (Signed)
Refill for 30 days only.  OFFICE VISIT NEEDED prior to any more refills 

## 2015-10-29 ENCOUNTER — Other Ambulatory Visit: Payer: Self-pay | Admitting: Internal Medicine

## 2015-11-23 ENCOUNTER — Other Ambulatory Visit: Payer: Self-pay | Admitting: Internal Medicine

## 2015-11-23 NOTE — Telephone Encounter (Signed)
Last OV with you 10/16 ok to fill ambien?

## 2015-11-24 NOTE — Telephone Encounter (Signed)
Refill for 30 days only.  OFFICE VISIT NEEDED prior to any more refills 

## 2015-11-24 NOTE — Telephone Encounter (Signed)
Patient scheduled.

## 2015-12-20 ENCOUNTER — Other Ambulatory Visit: Payer: Self-pay

## 2015-12-20 NOTE — Telephone Encounter (Signed)
error 

## 2015-12-30 ENCOUNTER — Ambulatory Visit (INDEPENDENT_AMBULATORY_CARE_PROVIDER_SITE_OTHER): Payer: BLUE CROSS/BLUE SHIELD | Admitting: Internal Medicine

## 2015-12-30 ENCOUNTER — Encounter: Payer: Self-pay | Admitting: Internal Medicine

## 2015-12-30 VITALS — BP 118/76 | HR 88 | Temp 98.4°F | Resp 16 | Ht 69.5 in | Wt 192.0 lb

## 2015-12-30 DIAGNOSIS — R7303 Prediabetes: Secondary | ICD-10-CM

## 2015-12-30 DIAGNOSIS — E559 Vitamin D deficiency, unspecified: Secondary | ICD-10-CM

## 2015-12-30 DIAGNOSIS — E119 Type 2 diabetes mellitus without complications: Secondary | ICD-10-CM | POA: Diagnosis not present

## 2015-12-30 DIAGNOSIS — I10 Essential (primary) hypertension: Secondary | ICD-10-CM

## 2015-12-30 DIAGNOSIS — E785 Hyperlipidemia, unspecified: Secondary | ICD-10-CM | POA: Diagnosis not present

## 2015-12-30 DIAGNOSIS — Z1239 Encounter for other screening for malignant neoplasm of breast: Secondary | ICD-10-CM | POA: Diagnosis not present

## 2015-12-30 DIAGNOSIS — Z79899 Other long term (current) drug therapy: Secondary | ICD-10-CM

## 2015-12-30 DIAGNOSIS — R5383 Other fatigue: Secondary | ICD-10-CM

## 2015-12-30 DIAGNOSIS — Z0000 Encounter for general adult medical examination without abnormal findings: Secondary | ICD-10-CM | POA: Diagnosis not present

## 2015-12-30 DIAGNOSIS — E663 Overweight: Secondary | ICD-10-CM

## 2015-12-30 LAB — LIPID PANEL
CHOL/HDL RATIO: 5
CHOLESTEROL: 204 mg/dL — AB (ref 0–200)
HDL: 43 mg/dL (ref >39.00)
LDL CALC: 137 mg/dL — AB (ref 0–99)
NonHDL: 160.52
TRIGLYCERIDES: 116 mg/dL (ref 0.0–149.0)
VLDL: 23.2 mg/dL (ref 0.0–40.0)

## 2015-12-30 LAB — COMPREHENSIVE METABOLIC PANEL
ALBUMIN: 4.5 g/dL (ref 3.5–5.2)
ALK PHOS: 79 U/L (ref 39–117)
ALT: 12 U/L (ref 0–35)
AST: 14 U/L (ref 0–37)
BILIRUBIN TOTAL: 0.5 mg/dL (ref 0.2–1.2)
BUN: 12 mg/dL (ref 6–23)
CO2: 28 mEq/L (ref 19–32)
Calcium: 9.9 mg/dL (ref 8.4–10.5)
Chloride: 106 mEq/L (ref 96–112)
Creatinine, Ser: 0.84 mg/dL (ref 0.40–1.20)
GFR: 75.64 mL/min (ref >60.00)
GLUCOSE: 107 mg/dL — AB (ref 70–99)
POTASSIUM: 4.6 meq/L (ref 3.5–5.1)
Sodium: 140 mEq/L (ref 135–145)
TOTAL PROTEIN: 7.4 g/dL (ref 6.0–8.3)

## 2015-12-30 LAB — LDL CHOLESTEROL, DIRECT: Direct LDL: 150 mg/dL

## 2015-12-30 LAB — MICROALBUMIN / CREATININE URINE RATIO
CREATININE, U: 135.8 mg/dL
MICROALB/CREAT RATIO: 0.5 mg/g (ref 0.0–30.0)

## 2015-12-30 LAB — VITAMIN D 25 HYDROXY (VIT D DEFICIENCY, FRACTURES): VITD: 24.67 ng/mL — AB (ref 30.00–100.00)

## 2015-12-30 LAB — VITAMIN B12: Vitamin B-12: 692 pg/mL (ref 211–911)

## 2015-12-30 LAB — HEMOGLOBIN A1C: HEMOGLOBIN A1C: 5.9 % (ref 4.6–6.5)

## 2015-12-30 LAB — TSH: TSH: 1.13 u[IU]/mL (ref 0.35–4.50)

## 2015-12-30 MED ORDER — CYCLOBENZAPRINE HCL 10 MG PO TABS: ORAL_TABLET | ORAL | 3 refills | Status: DC

## 2015-12-30 MED ORDER — OMEPRAZOLE 40 MG PO CPDR: 40.0000 mg | DELAYED_RELEASE_CAPSULE | Freq: Every day | ORAL | 11 refills | Status: DC

## 2015-12-30 MED ORDER — MELOXICAM 15 MG PO TABS: 15.0000 mg | ORAL_TABLET | Freq: Every day | ORAL | 1 refills | Status: DC

## 2015-12-30 MED ORDER — NALTREXONE-BUPROPION HCL ER 8-90 MG PO TB12: ORAL_TABLET | ORAL | 0 refills | Status: DC

## 2015-12-30 MED ORDER — ZOLPIDEM TARTRATE ER 12.5 MG PO TBCR: EXTENDED_RELEASE_TABLET | ORAL | 5 refills | Status: DC

## 2015-12-30 MED ORDER — ALPRAZOLAM 0.5 MG PO TABS
ORAL_TABLET | ORAL | 5 refills | Status: DC
Start: 2015-12-30 — End: 2016-06-24

## 2015-12-30 NOTE — Progress Notes (Signed)
Patient ID: Dawn Griffith, female    DOB: [birth year]  Age: 50 y.o. MRN: [id number]  The patient is here for annual  wellness examination and management of other chronic and acute problems.   Up to date on all screenings  Due for 3 d mammogram.   5 yr follow up colonoscopy due in Dec 2018 .  [facility] .   The risk factors are reflected in the social history.   The roster of all physicians providing medical care to patient - is listed in the Snapshot section of the chart.  Home safety : The patient has smoke detectors in the home. They wear seatbelts.  There are no firearms at home. There is no violence in the home.   There is no risks for hepatitis, STDs or HIV. There is no   history of blood transfusion. They have no travel history to infectious disease endemic areas of the world.  The patient has seen their dentist in the last six month. They have seen their eye doctor in the last year. They admit to slight hearing difficulty with regard to whispered voices and some television programs.  They have deferred audiologic testing in the last year.  They do not  have excessive sun exposure. Discussed the need for sun protection: hats, long sleeves and use of sunscreen if there is significant sun exposure.   Diet: the importance of a healthy diet is discussed. They do have a healthy diet.  The benefits of regular aerobic exercise were discussed. She walks 4 times per week ,  20 minutes.   Depression screen: there are no signs or vegative symptoms of depression- irritability, change in appetite, anhedonia, sadness/tearfullness.  The following portions of the patient's history were reviewed and updated as appropriate: allergies, current medications, past family history, past medical history,  past surgical history, past social history  and problem list.  Visual acuity was not assessed per patient preference since she has regular follow up with her ophthalmologist. Hearing and body mass index were assessed  and reviewed.   During the course of the visit the patient was educated and counseled about appropriate screening and preventive services including : fall prevention , diabetes screening, nutrition counseling, colorectal cancer screening, and recommended immunizations.    CC: The primary encounter diagnosis was Encounter for preventive health examination. Diagnoses of Vitamin D deficiency, Diabetes mellitus without complication ([place]), Hyperlipidemia LDL goal <100, Long-term use of high-risk medication, Other fatigue, Breast cancer screening, Overweight (BMI 25.0-29.9), Essential hypertension, benign, and Prediabetes were also pertinent to this visit.  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 She is aso taking care of her mother who has breast ca and colectomy. Chronic insomnia :  She is dependent on ambien ,  Elavil, and xanax for chronic insomnia  She is frustrated by her inability to lose weight and is requesting a trial of Contrave.  Diet , exercise regimen and use of meidication discussed indetail  [name] has a past medical history of Cancer ([place]) and Hypertension.   She has a past surgical history that includes Cesarean section (1992); Ectopic pregnancy surgery (1997); Lymph node biopsy; Nasal sinus surgery; Colonoscopy; and Cholecystectomy (Dec 2012).   Her family history includes Arrhythmia in her mother; Colon polyps in her mother; Diabetes in her father; Hypertension in her brother, father, and mother; Kidney disease in her mother; Uterine cancer in her mother.She reports  that she has never smoked. She has never used smokeless tobacco. She reports that she does not drink alcohol or use drugs.  Outpatient Medications Prior to Visit  Medication Sig Dispense Refill  . amitriptyline (ELAVIL) 100 MG tablet TAKE ONE TABLET BY MOUTH DAILY AT BEDTIME 90 tablet 0  . cetirizine  (ZYRTEC) 10 MG tablet Take 10 mg by mouth daily.      . Lactobacillus (PROBIOTIC ACIDOPHILUS PO) Take 1 tablet by mouth daily.    Marland Kitchen losartan (COZAAR) 100 MG tablet TAKE ONE TABLET BY MOUTH EVERY DAY 90 tablet 2  . spironolactone (ALDACTONE) 25 MG tablet TAKE ONE TABLET BY MOUTH EVERY DAY 90 tablet 3  . ALPRAZolam (XANAX) 0.5 MG tablet TAKE TWO TABLETS TWICE DAILY AS NEEDED FOR ANXIETY 60 tablet 0  . amoxicillin-clavulanate (AUGMENTIN) 875-125 MG tablet Take 1 tablet by mouth 2 (two) times daily. 14 tablet 0  . chlorpheniramine-HYDROcodone (TUSSIONEX PENNKINETIC ER) 10-8 MG/5ML SUER Take 5 mLs by mouth at bedtime as needed for cough. 240 mL 0  . cyclobenzaprine (FLEXERIL) 10 MG tablet TAKE ONE TABLET 3 TIMES DAILY AS NEEDED FOR MUSCLE SPASMS 90 tablet 3  . meloxicam (MOBIC) 15 MG tablet TAKE ONE TABLET EVERY DAY 90 tablet 1  . omeprazole (PRILOSEC) 40 MG capsule TAKE 1 CAPSULE EVERY DAY 30 capsule 3  . zolpidem (AMBIEN CR) 12.5 MG CR tablet TAKE ONE TABLET AT BEDTIME AS NEEDED FORSLEEP 30 tablet 0  . Multiple Vitamin (MULTIVITAMIN) capsule Take 1 capsule by mouth daily.    Marland Kitchen zoster vaccine live, PF, (ZOSTAVAX) 57846 UNT/0.65ML injection Inject 19,400 Units into the skin once. (Patient not taking: Reported on 12/30/2015) 1 each 0   No facility-administered medications prior to visit.     Review of Systems   Patient denies headache, fevers, malaise, unintentional weight loss, skin rash, eye pain, sinus congestion and sinus pain, sore throat, dysphagia,  hemoptysis , cough, dyspnea, wheezing, chest pain, palpitations, orthopnea, edema, abdominal pain, nausea, melena, diarrhea, constipation, flank pain, dysuria, hematuria, urinary  Frequency, nocturia, numbness, tingling, seizures,  Focal weakness, Loss of consciousness,  Tremor,depression, and suicidal ideation.       Objective:  BP 118/76 (BP Location: Left Arm, Patient Position: Sitting, Cuff Size: Large)   Pulse 88   Temp 98.4 F (36.9 C)  (Oral)   Resp 16   Ht 5' 9.5" (1.765 m)   Wt 192 lb (87.1 kg)   BMI 27.95 kg/m   Physical Exam  General appearance: alert, cooperative and appears stated age Head: Normocephalic, without obvious abnormality, atraumatic Eyes: conjunctivae/corneas clear. PERRL, EOM's intact. Fundi benign. Ears: normal TM's and external ear canals both ears Nose: Nares normal. Septum midline. Mucosa normal. No drainage or sinus tenderness. Throat: lips, mucosa, and tongue normal; teeth and gums normal Neck: no adenopathy, no carotid bruit, no JVD, supple, symmetrical, trachea midline and thyroid not enlarged, symmetric, no tenderness/mass/nodules Lungs: clear to auscultation bilaterally Breasts: normal appearance, no masses or tenderness Heart: regular rate and rhythm, S1, S2 normal, no murmur, click, rub or gallop Abdomen: soft, non-tender; bowel sounds normal; no masses,  no organomegaly Extremities: extremities normal, atraumatic, no cyanosis or edema Pulses: 2+ and symmetric Skin: Skin color, texture, turgor normal. No rashes or lesions Neurologic: Alert and oriented X 3, normal strength and tone. Normal symmetric reflexes. Normal coordination and gait.    Assessment & Plan:   Problem List Items Addressed This Visit    Encounter for preventive health examination - Primary  Annual comprehensive preventive exam was done as well as an evaluation and management of chronic conditions .  During the course of the visit the patient was educated and counseled about appropriate screening and preventive services including :  diabetes screening, lipid analysis with projected  10 year  risk for CAD , nutrition counseling, breast, cervical and colorectal cancer screening, and recommended immunizations.  Printed recommendations for health maintenance screenings was given      Overweight (BMI 25.0-29.9)    I have addressed  BMI and recommended a low glycemic index diet utilizing smaller more frequent meals to  increase metabolism.  I have also recommended that patient start exercising with a goal of 30 minutes of aerobic exercise a minimum of 5 days per week. Trial of Contrave discussed and medication prescribed.        Essential hypertension, benign    Well controlled on current regimen. Renal function stable, no changes today.      Prediabetes    Suggested by A1c of 5.9 today and fasting glucoawa.  Low GI diet, regular exercise and weight loss.        Other Visit Diagnoses    Vitamin D deficiency       Relevant Orders   VITAMIN D 25 Hydroxy (Vit-D Deficiency, Fractures) (Completed)   Diabetes mellitus without complication ([place])       Relevant Orders   Hemoglobin A1c (Completed)   Lipid panel (Completed)   Microalbumin / creatinine urine ratio (Completed)   Hyperlipidemia LDL goal <100       Relevant Orders   LDL cholesterol, direct (Completed)   Long-term use of high-risk medication       Relevant Orders   Comprehensive metabolic panel (Completed)   Other fatigue       Relevant Orders   Vitamin B12 (Completed)   TSH (Completed)   Breast cancer screening       Relevant Orders   MM DIGITAL SCREENING BILATERAL      I have discontinued Ms. Tantillo's zoster vaccine live (PF), amoxicillin-clavulanate, and chlorpheniramine-HYDROcodone. I have also changed her omeprazole and meloxicam. Additionally, I am having her start on Naltrexone-Bupropion HCl ER. Lastly, I am having her maintain her cetirizine, multivitamin, Lactobacillus (PROBIOTIC ACIDOPHILUS PO), spironolactone, losartan, amitriptyline, ALPRAZolam, zolpidem, and cyclobenzaprine.  Meds ordered this encounter  Medications  . Naltrexone-Bupropion HCl ER 8-90 MG TB12    Sig: One tablet every morning for one week, then twice daily for one week.. Increase gradually to 2 tablets twice daily    Dispense:  120 tablet    Refill:  0  . ALPRAZolam (XANAX) 0.5 MG tablet    Sig: TAKE TWO TABLETS TWICE DAILY AS NEEDED FOR ANXIETY     Dispense:  60 tablet    Refill:  5  . zolpidem (AMBIEN CR) 12.5 MG CR tablet    Sig: TAKE ONE TABLET AT BEDTIME AS NEEDED FORSLEEP    Dispense:  30 tablet    Refill:  5    Refill for 30 days only.  OFFICE VISIT NEEDED prior to any more refills  . omeprazole (PRILOSEC) 40 MG capsule    Sig: Take 1 capsule (40 mg total) by mouth daily.    Dispense:  30 capsule    Refill:  11    FOR NEXT FILLTHANK YOU  . meloxicam (MOBIC) 15 MG tablet    Sig: Take 1 tablet (15 mg total) by mouth daily.    Dispense:  90 tablet    Refill:  1    PROFILE FOR NEXT TIME  . cyclobenzaprine (FLEXERIL) 10 MG tablet    Sig: TAKE ONE TABLET 3 TIMES DAILY AS NEEDED FOR MUSCLE SPASMS    Dispense:  90 tablet    Refill:  3    Medications Discontinued During This Encounter  Medication Reason  . amoxicillin-clavulanate (AUGMENTIN) 875-125 MG tablet Completed Course  . chlorpheniramine-HYDROcodone (TUSSIONEX PENNKINETIC ER) 10-8 MG/5ML SUER No longer needed (for PRN medications)  . zoster vaccine live, PF, (ZOSTAVAX) 60454 UNT/0.65ML injection One time medication  . ALPRAZolam (XANAX) 0.5 MG tablet Reorder  . zolpidem (AMBIEN CR) 12.5 MG CR tablet Reorder  . omeprazole (PRILOSEC) 40 MG capsule Reorder  . meloxicam (MOBIC) 15 MG tablet Reorder  . cyclobenzaprine (FLEXERIL) 10 MG tablet Reorder    Follow-up: Return in about 6 months (around 07/01/2016) for follow up diabetes.   Crecencio Mc, MD

## 2015-12-30 NOTE — Patient Instructions (Addendum)
You might want to try a premixed protein drink called Premier Protein shake for breakfast or late night snack . It is great tasting,   very low sugar and available of < $2 serving at [facility] and  In bulk for $1.50/serving at [employer] and [employer]  .    Nutritional analysis :  160 cal  30 g protein  1 g sugar 50% calcium needs   [facility]ladimir Faster and [employer]  I will get your 3 d mammogram ordered at [facility] and the office will call you    Bupropion; Naltrexone extended-release tablets What is this medicine? BUPROPION; NALTREXONE (byoo PROE pee on; nal TREX one) is a combination product used to promote and maintain weight loss in obese adults or overweight adults who also have weight related medical problems. This medicine should be used with a reduced calorie diet and increased physical activity. This medicine may be used for other purposes; ask your health care provider or pharmacist if you have questions. What should I tell my health care provider before I take this medicine? They need to know if you have any of these conditions: -an eating disorder, such as anorexia or bulimia -diabetes -glaucoma -head injury -heart disease -high blood pressure -history of a drug or alcohol abuse problem -history of a tumor or infection of your brain or spine -history of stroke -history of irregular heartbeat -kidney disease -liver disease -mental illness such as bipolar disorder or psychosis -seizures -suicidal thoughts, plans, or attempt; a previous suicide attempt by you or a family member -an unusual or allergic reaction to bupropion, naltrexone, other medicines, foods, dyes, or preservatives breast-feeding -pregnant or trying to become pregnant How should I use this medicine? Take this medicine by mouth with a glass of water. Follow the directions on the prescription label. Take this medicine in the morning and in the evenings as directed by your healthcare professional. Dennis Bast can take it with or without  food. Do not take with high-fat meals as this may increase your risk of seizures. Do not crush, chew, or cut these tablets. Do not take your medicine more often than directed. Do not stop taking this medicine suddenly except upon the advice of your doctor. A special MedGuide will be given to you by the pharmacist with each prescription and refill. Be sure to read this information carefully each time. Talk to your pediatrician regarding the use of this medicine in children. Special care may be needed. Overdosage: If you think you have taken too much of this medicine contact a poison control center or emergency room at once. NOTE: This medicine is only for you. Do not share this medicine with others. What if I miss a dose? If you miss a dose, skip the missed dose and take your next tablet at the regular time. Do not take double or extra doses. What may interact with this medicine? Do not take this medicine with any of the following medications: -any prescription or street opioid drug like codiene, heroin, methadone -linezolid -MAOIs like Carbex, Eldepryl, Marplan, Nardil, and Parnate -methylene blue (injected into a vein) -other medicines that contain bupropion like Zyban or Wellbutrin This medicine may also interact with the following medications: -alcohol -certain medicines for anxiety or sleep -certain medicines for blood pressure like metoprolol, propranolol -certain medicines for depression or psychotic disturbances -certain medicines for HIV or AIDS like efavirenz, lopinavir, nelfinavir, ritonavir -certain medicines for irregular heart beat like propafenone, flecainide -certain medicines for Parkinson's disease like amantadine, levodopa -certain  medicines for seizures like carbamazepine, phenytoin, phenobarbital -cimetidine -clopidogrel -cyclophosphamide -disulfiram -furazolidone -isoniazid -nicotine -orphenadrine -procarbazine -steroid medicines like prednisone or  cortisone -stimulant medicines for attention disorders, weight loss, or to stay awake -tamoxifen -theophylline -thioridazine -thiotepa -ticlopidine -tramadol -warfarin This list may not describe all possible interactions. Give your health care provider a list of all the medicines, herbs, non-prescription drugs, or dietary supplements you use. Also tell them if you smoke, drink alcohol, or use illegal drugs. Some items may interact with your medicine. What should I watch for while using this medicine? This medicine is intended to be used in addition to a healthy diet and appropriate exercise. The best results are achieved this way. Do not increase or in any way change your dose without consulting your doctor or health care professional. Do not take this medicine with other prescription or over-the-counter weight loss products without consulting your doctor or health care professional. Your doctor should tell you to stop taking this medicine if you do not lose a certain amount of weight within the first 12 weeks of treatment. Visit your doctor or health care professional for regular checkups. Your doctor may order blood tests or other tests to see how you are doing. This medicine may affect blood sugar levels. If you have diabetes, check with your doctor or health care professional before you change your diet or the dose of your diabetic medicine. Patients and their families should watch out for new or worsening depression or thoughts of suicide. Also watch out for sudden changes in feelings such as feeling anxious, agitated, panicky, irritable, hostile, aggressive, impulsive, severely restless, overly excited and hyperactive, or not being able to sleep. If this happens, especially at the beginning of treatment or after a change in dose, call your health care professional. Avoid alcoholic drinks while taking this medicine. Drinking large amounts of alcoholic beverages, using sleeping or anxiety  medicines, or quickly stopping the use of these agents while taking this medicine may increase your risk for a seizure. What side effects may I notice from receiving this medicine? Side effects that you should report to your doctor or health care professional as soon as possible: -allergic reactions like skin rash, itching or hives, swelling of the face, lips, or tongue -breathing problems -changes in vision, hearing -chest pain -confusion -dark urine -depressed mood -fast or irregular heart beat -fever -hallucination, loss of contact with reality -increased blood pressure -light-colored stools -redness, blistering, peeling or loosening of the skin, including inside the mouth -right upper belly pain -seizures -suicidal thoughts or other mood changes -unusually weak or tired -vomiting -yellowing of the eyes or skin Side effects that usually do not require medical attention (Report these to your doctor or health care professional if they continue or are bothersome.): -constipation -diarrhea -dizziness -dry mouth -headache -nausea -trouble sleeping This list may not describe all possible side effects. Call your doctor for medical advice about side effects. You may report side effects to FDA at 1-800-FDA-1088. Where should I keep my medicine? Keep out of the reach of children. Store at room temperature between 15 and 30 degrees C (59 and 86 degrees F). Throw away any unused medicine after the expiration date. NOTE: This sheet is a summary. It may not cover all possible information. If you have questions about this medicine, talk to your doctor, pharmacist, or health care provider.    2016, [employer]. (2013-02-26 15:17:29)

## 2015-12-31 ENCOUNTER — Telehealth: Payer: Self-pay

## 2015-12-31 NOTE — Telephone Encounter (Signed)
PA for Contrave completed on cover my meds, thanks

## 2016-01-02 ENCOUNTER — Encounter: Payer: Self-pay | Admitting: Internal Medicine

## 2016-01-02 DIAGNOSIS — R7303 Prediabetes: Secondary | ICD-10-CM

## 2016-01-02 HISTORY — DX: Prediabetes: R73.03

## 2016-01-02 NOTE — Assessment & Plan Note (Signed)
Annual comprehensive preventive exam was done as well as an evaluation and management of chronic conditions .  During the course of the visit the patient was educated and counseled about appropriate screening and preventive services including :  diabetes screening, lipid analysis with projected  10 year  risk for CAD , nutrition counseling, breast, cervical and colorectal cancer screening, and recommended immunizations.  Printed recommendations for health maintenance screenings was given 

## 2016-01-02 NOTE — Assessment & Plan Note (Addendum)
I have addressed  BMI and recommended a low glycemic index diet utilizing smaller more frequent meals to increase metabolism.  I have also recommended that patient start exercising with a goal of 30 minutes of aerobic exercise a minimum of 5 days per week. Trial of Contrave discussed and medication prescribed.

## 2016-01-02 NOTE — Assessment & Plan Note (Signed)
Suggested by A1c of 5.9 today and fasting glucoawa.  Low GI diet, regular exercise and weight loss.

## 2016-01-02 NOTE — Assessment & Plan Note (Signed)
Well controlled on current regimen. Renal function stable, no changes today. 

## 2016-01-04 NOTE — Telephone Encounter (Signed)
Prior authorization for Dawn Griffith has been approved from 12/31/15 to 06/27/16.

## 2016-01-25 ENCOUNTER — Other Ambulatory Visit: Payer: Self-pay | Admitting: Internal Medicine

## 2016-01-27 ENCOUNTER — Ambulatory Visit
Admission: RE | Admit: 2016-01-27 | Discharge: 2016-01-27 | Disposition: A | Discharge status: Home / Self Care | Payer: BLUE CROSS/BLUE SHIELD | Source: Ambulatory Visit | Attending: Internal Medicine | Admitting: Internal Medicine

## 2016-01-27 ENCOUNTER — Other Ambulatory Visit: Payer: Self-pay | Admitting: Internal Medicine

## 2016-01-27 DIAGNOSIS — Z1239 Encounter for other screening for malignant neoplasm of breast: Secondary | ICD-10-CM

## 2016-01-27 DIAGNOSIS — Z1231 Encounter for screening mammogram for malignant neoplasm of breast: Secondary | ICD-10-CM | POA: Diagnosis not present

## 2016-02-01 ENCOUNTER — Other Ambulatory Visit: Payer: Self-pay | Admitting: Internal Medicine

## 2016-03-23 ENCOUNTER — Other Ambulatory Visit: Payer: Self-pay | Admitting: Internal Medicine

## 2016-06-02 ENCOUNTER — Other Ambulatory Visit: Payer: Self-pay | Admitting: Internal Medicine

## 2016-06-24 ENCOUNTER — Other Ambulatory Visit: Payer: Self-pay | Admitting: Internal Medicine

## 2016-06-26 NOTE — Telephone Encounter (Signed)
Rf xanax 0.5 mg Last Ov: 12/29/2016 Next Ov: 07/03/2016 Last refilled 12/30/2015 Please advise

## 2016-07-02 ENCOUNTER — Other Ambulatory Visit: Payer: Self-pay | Admitting: Internal Medicine

## 2016-07-03 ENCOUNTER — Ambulatory Visit (INDEPENDENT_AMBULATORY_CARE_PROVIDER_SITE_OTHER): Payer: BLUE CROSS/BLUE SHIELD | Admitting: Internal Medicine

## 2016-07-03 ENCOUNTER — Encounter: Payer: Self-pay | Admitting: Internal Medicine

## 2016-07-03 VITALS — BP 110/78 | HR 111 | Temp 98.8°F | Resp 16 | Ht 70.0 in | Wt 191.0 lb

## 2016-07-03 DIAGNOSIS — E785 Hyperlipidemia, unspecified: Secondary | ICD-10-CM

## 2016-07-03 DIAGNOSIS — K5904 Chronic idiopathic constipation: Secondary | ICD-10-CM

## 2016-07-03 DIAGNOSIS — E663 Overweight: Secondary | ICD-10-CM

## 2016-07-03 DIAGNOSIS — R7303 Prediabetes: Secondary | ICD-10-CM | POA: Diagnosis not present

## 2016-07-03 DIAGNOSIS — E559 Vitamin D deficiency, unspecified: Secondary | ICD-10-CM

## 2016-07-03 DIAGNOSIS — I10 Essential (primary) hypertension: Secondary | ICD-10-CM | POA: Diagnosis not present

## 2016-07-03 DIAGNOSIS — F5105 Insomnia due to other mental disorder: Secondary | ICD-10-CM

## 2016-07-03 DIAGNOSIS — F419 Anxiety disorder, unspecified: Secondary | ICD-10-CM

## 2016-07-03 DIAGNOSIS — R946 Abnormal results of thyroid function studies: Secondary | ICD-10-CM

## 2016-07-03 DIAGNOSIS — R7989 Other specified abnormal findings of blood chemistry: Secondary | ICD-10-CM

## 2016-07-03 DIAGNOSIS — E7800 Pure hypercholesterolemia, unspecified: Secondary | ICD-10-CM

## 2016-07-03 LAB — COMPREHENSIVE METABOLIC PANEL
ALT: 14 U/L (ref 0–35)
AST: 15 U/L (ref 0–37)
Albumin: 4.6 g/dL (ref 3.5–5.2)
Alkaline Phosphatase: 83 U/L (ref 39–117)
BUN: 10 mg/dL (ref 6–23)
CO2: 27 meq/L (ref 19–32)
Calcium: 9.8 mg/dL (ref 8.4–10.5)
Chloride: 104 mEq/L (ref 96–112)
Creatinine, Ser: 0.85 mg/dL (ref 0.40–1.20)
GFR: 74.47 mL/min (ref >60.00)
GLUCOSE: 105 mg/dL — AB (ref 70–99)
POTASSIUM: 4.1 meq/L (ref 3.5–5.1)
SODIUM: 138 meq/L (ref 135–145)
Total Bilirubin: 0.4 mg/dL (ref 0.2–1.2)
Total Protein: 7.4 g/dL (ref 6.0–8.3)

## 2016-07-03 LAB — VITAMIN D 25 HYDROXY (VIT D DEFICIENCY, FRACTURES): VITD: 25.87 ng/mL — ABNORMAL LOW (ref 30.00–100.00)

## 2016-07-03 LAB — POCT GLYCOSYLATED HEMOGLOBIN (HGB A1C): HEMOGLOBIN A1C: 5.6

## 2016-07-03 LAB — LDL CHOLESTEROL, DIRECT: Direct LDL: 124 mg/dL

## 2016-07-03 NOTE — Progress Notes (Addendum)
Subjective:  Patient ID: Dawn Griffith, female    DOB: 10/04/1965  Age: 51 y.o. MRN: [id number]  CC: The primary encounter diagnosis was Prediabetes. Diagnoses of Vitamin D deficiency, Essential hypertension, Pure hypercholesterolemia, Overweight (BMI 25.0-29.9), Abnormal thyroid blood test, Chronic idiopathic constipation, Hyperlipidemia LDL goal <130, and Insomnia secondary to anxiety were also pertinent to this visit.  HPI Dawn Griffith presents for follow up diabetes and constipation . She is not  fasting today.  6 month follow up .  Patient is following a low glycemic index diet and taking all prescribed medications regularly without side effects.  Not checking sugars on a regular basis but on rare occasions when she checks, her fasting sugars have been under less than 120 most of the time and post prandials have been under 160 except on rare occasions. Patient is not exercising yet on a regular basis but has plans to start  3 times per week andis  intentionally trying to lose weight .  Patient has had an eye exam in the last 12 months and checks feet regularly for signs of infection.  Patient does not walk barefoot outside,  And denies an numbness tingling or burning in feet. Patient is up to date on all recommended vaccinations   2) Constipated. Using dulcolax regularly.  Drinking at least 48 ounces of water daily.   3) overweight:  She Has lost 5 lbs in 3 weeks on weight watchers .  Goal is 160 Has not added exercise yet or given up  4 ounces of coca cola   4) Hypertension: patient checks blood pressure twice monthlyy at home.  Readings have been for the most part < 140/80 at rest . Patient is following a reduce salt diet most days and is taking medications as prescribed  5) Chronic insomnia: , with no improvement using over-the-counter first generation antihistamines. Reviewed principles of good sleep hygiene. Although she is a snorer, there is no report of apneic spells by husband.  Using trazodone and alprazolam with no adverse events or dose escalation. .   Outpatient Medications Prior to Visit  Medication Sig Dispense Refill  . ALPRAZolam (XANAX) 0.5 MG tablet TAKE 2 TABLETS BY MOUTH TWICE DAILY AS NEEDED FOR ANXIETY 60 tablet 0  . amitriptyline (ELAVIL) 100 MG tablet TAKE 1 TABLET BY MOUTH AT BEDTIME 90 tablet 1  . cetirizine (ZYRTEC) 10 MG tablet Take 10 mg by mouth daily.      . cyclobenzaprine (FLEXERIL) 10 MG tablet TAKE ONE TABLET 3 TIMES DAILY AS NEEDED FOR MUSCLE SPASMS 90 tablet 3  . Lactobacillus (PROBIOTIC ACIDOPHILUS PO) Take 1 tablet by mouth daily.    Marland Kitchen losartan (COZAAR) 100 MG tablet TAKE 1 TABLET BY MOUTH DAILY 90 tablet 1  . meloxicam (MOBIC) 15 MG tablet TAKE 1 TABLET BY MOUTH DAILY 90 tablet 1  . Multiple Vitamin (MULTIVITAMIN) capsule Take 1 capsule by mouth daily.    Marland Kitchen omeprazole (PRILOSEC) 40 MG capsule Take 1 capsule (40 mg total) by mouth daily. 30 capsule 11  . spironolactone (ALDACTONE) 25 MG tablet TAKE ONE TABLET BY MOUTH EVERY DAY 90 tablet 3  . Naltrexone-Bupropion HCl ER 8-90 MG TB12 One tablet every morning for one week, then twice daily for one week.. Increase gradually to 2 tablets twice daily 120 tablet 0  . zolpidem (AMBIEN CR) 12.5 MG CR tablet TAKE 1 TABLET BY MOUTH AT BEDTIME AS NEEDED FOR SLEEP 30 tablet 0   No facility-administered medications prior to visit.  Review of Systems;  Patient denies headache, fevers, malaise, unintentional weight loss, skin rash, eye pain, sinus congestion and sinus pain, sore throat, dysphagia,  hemoptysis , cough, dyspnea, wheezing, chest pain, palpitations, orthopnea, edema, abdominal pain, nausea, melena, diarrhea, constipation, flank pain, dysuria, hematuria, urinary  Frequency, nocturia, numbness, tingling, seizures,  Focal weakness, Loss of consciousness,  Tremor, insomnia, depression, anxiety, and suicidal ideation.      Objective:  BP 110/78   Pulse (!) 111   Temp 98.8 F (37.1 C)  (Oral)   Resp 16   Ht 5\' 10"  (1.778 m)   Wt 191 lb (86.6 kg)   LMP 05/01/2014 (Approximate)   SpO2 99%   BMI 27.41 kg/m   BP Readings from Last 3 Encounters:  07/03/16 110/78  12/30/15 118/76  05/10/15 118/78    Wt Readings from Last 3 Encounters:  07/03/16 191 lb (86.6 kg)  12/30/15 192 lb (87.1 kg)  05/10/15 197 lb 3.2 oz (89.4 kg)    General appearance: alert, cooperative and appears stated age Ears: normal TM's and external ear canals both ears Throat: lips, mucosa, and tongue normal; teeth and gums normal Neck: no adenopathy, no carotid bruit, supple, symmetrical, trachea midline and thyroid not enlarged, symmetric, no tenderness/mass/nodules Back: symmetric, no curvature. ROM normal. No CVA tenderness. Lungs: clear to auscultation bilaterally Heart: regular rate and rhythm, S1, S2 normal, no murmur, click, rub or gallop Abdomen: soft, non-tender; bowel sounds normal; no masses,  no organomegaly Pulses: 2+ and symmetric Skin: Skin color, texture, turgor normal. No rashes or lesions Lymph nodes: Cervical, supraclavicular, and axillary nodes normal.  Lab Results  Component Value Date   HGBA1C 5.6 07/03/2016   HGBA1C 5.9 12/30/2015    Lab Results  Component Value Date   CREATININE 0.85 07/03/2016   CREATININE 0.84 12/30/2015   CREATININE 0.78 12/16/2014    Lab Results  Component Value Date   WBC 6.8 12/16/2014   HGB 13.7 12/16/2014   HCT 41.0 12/16/2014   PLT 248.0 12/16/2014   GLUCOSE 105 (H) 07/03/2016   CHOL 204 (H) 12/30/2015   TRIG 116.0 12/30/2015   HDL 43.00 12/30/2015   LDLDIRECT 124.0 07/03/2016   LDLCALC 137 (H) 12/30/2015   ALT 14 07/03/2016   AST 15 07/03/2016   NA 138 07/03/2016   K 4.1 07/03/2016   CL 104 07/03/2016   CREATININE 0.85 07/03/2016   BUN 10 07/03/2016   CO2 27 07/03/2016   TSH 1.13 12/30/2015   HGBA1C 5.6 07/03/2016   MICROALBUR <0.7 12/30/2015    Mm Screening Breast Tomo Bilateral  Result Date: 02/02/2016 CLINICAL  DATA:  Screening. EXAM: 2D DIGITAL SCREENING BILATERAL MAMMOGRAM WITH CAD AND ADJUNCT TOMO COMPARISON:  Previous exam(s). ACR Breast Density Category c: The breast tissue is heterogeneously dense, which may obscure small masses. FINDINGS: There are no findings suspicious for malignancy. Images were processed with CAD. IMPRESSION: No mammographic evidence of malignancy. A result letter of this screening mammogram will be mailed directly to the patient. RECOMMENDATION: Screening mammogram in one year. (Code:SM-B-01Y) BI-RADS CATEGORY  1: Negative. Electronically Signed   By: Pamelia Hoit M.D.   On: 02/02/2016 09:33    Assessment & Plan:   Problem List Items Addressed This Visit    Abnormal thyroid blood test    Repeat testing  Last July was normal.  Repeat annually or is she develops symptoms of hyperthyroidism,  No treatment advised  unless TSH is < 0.1  Lab Results  Component Value Date  TSH 1.13 12/30/2015         Constipation    Chronic, with screening  Colonoscopy in 2008 at age 51  due to [place] of FAP  Recommend daily stool softener and BFL,  Advised to avoid regular use of stimulant laxatives.       Hyperlipidemia LDL goal <130    New ACC guidelines recommend starting patients aged 75 or higher on moderate intensity statin therapy for LDL between 70-189 and 10 yr risk of CAD > 7.5% ;  Using the Framingham risk calculator,  her 10 year risk of coronary artery disease was 4.3% , so no therapy was recommended other than diet and exercise. Her LDL is now 124 non fasting  Lab Results  Component Value Date   CHOL 204 (H) 12/30/2015   HDL 43.00 12/30/2015   LDLCALC 137 (H) 12/30/2015   LDLDIRECT 124.0 07/03/2016   TRIG 116.0 12/30/2015   CHOLHDL 5 12/30/2015           Insomnia secondary to anxiety    Managed with trazodone and alprazolam.  The risks and benefits of benzodiazepine use were reviewed  with patient today including excessive sedation leading to respiratory depression,   impaired thinking/driving, and addiction.  Patient was advised to avoid concurrent use with alcohol, to use medication only as needed and not to share with others  .       Overweight (BMI 25.0-29.9)    I have congratulated her in reduction of   BMI and encouraged  Continued weight loss with goal of 10% of body weigh over the next 6 months using a low glycemic index diet and regular exercise a minimum of 5 days per week.        Prediabetes - Primary    Noted reduction in A1c from 5.9  To 5.6  today .  Continue  Low GI diet, regular exercise and weight loss and follow up in 6 months   Lab Results  Component Value Date   HGBA1C 5.6 07/03/2016         Relevant Orders   POCT HgB A1C (Completed)   Vitamin D deficiency    Recurrent but mild,  Increase daily intake to 2000 Ius       Relevant Orders   VITAMIN D 25 Hydroxy (Vit-D Deficiency, Fractures) (Completed)    Other Visit Diagnoses    Essential hypertension       Relevant Orders   Comprehensive metabolic panel (Completed)     A total of 25 minutes of face to face time was spent with patient more than half of which was spent in counselling about the above mentioned conditions  and coordination of care  I have discontinued Ms. Vanweelden Naltrexone-Bupropion HCl ER. I am also having her maintain her cetirizine, multivitamin, Lactobacillus (PROBIOTIC ACIDOPHILUS PO), spironolactone, omeprazole, cyclobenzaprine, losartan, amitriptyline, meloxicam, and ALPRAZolam.  No orders of the defined types were placed in this encounter.   Medications Discontinued During This Encounter  Medication Reason  . Naltrexone-Bupropion HCl ER 8-90 MG TB12     Follow-up: No Follow-up on file.   Crecencio Mc, MD

## 2016-07-03 NOTE — Patient Instructions (Addendum)
Start using a daily stool softener at bedtime (Colace,  Generic is docusate) and a bulk forming laxative  30 mintues before your biggest meal   Your a1c was 5.6 !!    Great job!!!!  I toitally approve of [employer] for your weight loss efforts. Your first goal is 183 lbs   (160 lbs puts you at a BMI of 23)   Try the [place] carb balance tortillas  26 g fiber per serving    Try "Pasta zero  "  ([employer] near the tofu in the veggie section)  Very low carb/low cal   If you want to lose weight using medication,  I  am recommending use of the medication called Saxenda to help you lose weight.  It is similar to a a medicine that is used to treat diabetes called Victoza,  So It may lower your blood sugars .   It is injected daily in incrementally increasing doses (if tolerated,  Nausea usually resolves in a few days)"  0.6 mg daily   Week 1 1.2 mg daily Week 2 1.8 mg  [place]aly Week 3 2.4 mg daily Week 4 3.0 mg daily Week 5 and ongoing   If you want to try it,  Let me know and I will send the rx to your pharmacy .   bring the pen with you to be shown how to give yourself the dose,  We wil make you an  RN visit once you pick up your medication from your pharmacy     To make a low carb chip :  Take the Joseph's Lavash or Pita bread,  Or the Mission Low carb whole wheat tortilla   Place on metal cookie sheet  Brush with olive oil  Sprinkle garlic powder (NOT garlic salt), grated parmesan cheese, mediterranean seasoning , or all of them?  Bake at 275 for 30 minutes   We have substitutions for your potatoes!!  Try the mashed cauliflower and riced cauliflower dishes instead of rice and mashed potatoes  Mashed turnips are also very low carb!   For desserts :  Try the [employer] greek yogurt dessert flavors and top with reddi Whip .  8 carbs,  80 calories  Try [employer]ikos Triple Zero [place] Yogurt in the salted caramel, and the coffee flavors  With Whipped Cream for dessert  breyer's  low carb ice cream, available in bars (on a stick, better ) or scoopable ice cream  HERE ARE THE LOW CARB  BREAD CHOICES

## 2016-07-03 NOTE — Progress Notes (Signed)
Pre-visit discussion using our clinic review tool. No additional management support is needed unless otherwise documented below in the visit note.  

## 2016-07-03 NOTE — Telephone Encounter (Signed)
Pt last refill on Ambien was on 06/02/16. Pt was last seen today, ok to refill?

## 2016-07-04 ENCOUNTER — Encounter: Payer: Self-pay | Admitting: Internal Medicine

## 2016-07-04 DIAGNOSIS — F419 Anxiety disorder, unspecified: Secondary | ICD-10-CM

## 2016-07-04 DIAGNOSIS — E559 Vitamin D deficiency, unspecified: Secondary | ICD-10-CM | POA: Insufficient documentation

## 2016-07-04 DIAGNOSIS — F5105 Insomnia due to other mental disorder: Secondary | ICD-10-CM

## 2016-07-04 HISTORY — DX: Anxiety disorder, unspecified: F41.9

## 2016-07-04 HISTORY — DX: Vitamin D deficiency, unspecified: E55.9

## 2016-07-04 NOTE — Assessment & Plan Note (Signed)
Managed with trazodone and alprazolam.  The risks and benefits of benzodiazepine use were reviewed  with patient today including excessive sedation leading to respiratory depression,  impaired thinking/driving, and addiction.  Patient was advised to avoid concurrent use with alcohol, to use medication only as needed and not to share with others  .

## 2016-07-04 NOTE — Assessment & Plan Note (Signed)
Recurrent but mild,  Increase daily intake to 2000 Ius

## 2016-07-04 NOTE — Assessment & Plan Note (Signed)
Noted reduction in A1c from 5.9  To 5.6  today .  Continue  Low GI diet, regular exercise and weight loss and follow up in 6 months   Lab Results  Component Value Date   HGBA1C 5.6 07/03/2016    

## 2016-07-04 NOTE — Assessment & Plan Note (Addendum)
Chronic, with screening  Colonoscopy in 2008 at age 51  due to [place] of FAP  Recommend daily stool softener and BFL,  Advised to avoid regular use of stimulant laxatives.

## 2016-07-04 NOTE — Assessment & Plan Note (Signed)
Repeat testing  Last July was normal.  Repeat annually or is she develops symptoms of hyperthyroidism,  No treatment advised  unless TSH is < 0.1  Lab Results  Component Value Date   TSH 1.13 12/30/2015

## 2016-07-04 NOTE — Assessment & Plan Note (Signed)
I have congratulated her in reduction of   BMI and encouraged  Continued weight loss with goal of 10% of body weigh over the next 6 months using a low glycemic index diet and regular exercise a minimum of 5 days per week.    

## 2016-07-04 NOTE — Assessment & Plan Note (Signed)
New ACC guidelines recommend starting patients aged 53 or higher on moderate intensity statin therapy for LDL between 70-189 and 10 yr risk of CAD > 7.5% ;  Using the Framingham risk calculator,  her 10 year risk of coronary artery disease was 4.3% , so no therapy was recommended other than diet and exercise. Her LDL is now 124 non fasting  Lab Results  Component Value Date   CHOL 204 (H) 12/30/2015   HDL 43.00 12/30/2015   LDLCALC 137 (H) 12/30/2015   LDLDIRECT 124.0 07/03/2016   TRIG 116.0 12/30/2015   CHOLHDL 5 12/30/2015

## 2016-07-06 NOTE — Telephone Encounter (Signed)
Patient notified

## 2016-07-06 NOTE — Telephone Encounter (Signed)
Patient notified of labs. Was unaware a message was sent via mychart

## 2016-07-24 LAB — HM COLONOSCOPY

## 2016-07-25 ENCOUNTER — Other Ambulatory Visit: Payer: Self-pay | Admitting: Internal Medicine

## 2016-07-26 NOTE — Telephone Encounter (Signed)
refilled 

## 2016-07-26 NOTE — Telephone Encounter (Signed)
LOV: 07/03/16 Next OV: 10/14/16  LF: # 60 on 06/26/16  Ok to Rf?

## 2016-08-08 ENCOUNTER — Other Ambulatory Visit: Payer: Self-pay | Admitting: Internal Medicine

## 2016-08-08 NOTE — Telephone Encounter (Signed)
Amitriptyline LF: 02/02/16 # 90 w/ 1 Rf LOV: 07/03/16 Next OV w/ you: 10/04/16   Ok to Rf?

## 2016-08-21 ENCOUNTER — Other Ambulatory Visit: Payer: Self-pay | Admitting: Internal Medicine

## 2016-10-04 ENCOUNTER — Encounter: Payer: Self-pay | Admitting: Internal Medicine

## 2016-10-04 ENCOUNTER — Ambulatory Visit (INDEPENDENT_AMBULATORY_CARE_PROVIDER_SITE_OTHER): Payer: BLUE CROSS/BLUE SHIELD | Admitting: Internal Medicine

## 2016-10-04 VITALS — BP 102/68 | HR 97 | Temp 98.3°F | Resp 16 | Ht 70.0 in | Wt 188.4 lb

## 2016-10-04 DIAGNOSIS — D649 Anemia, unspecified: Secondary | ICD-10-CM | POA: Diagnosis not present

## 2016-10-04 DIAGNOSIS — R7303 Prediabetes: Secondary | ICD-10-CM

## 2016-10-04 DIAGNOSIS — F5105 Insomnia due to other mental disorder: Secondary | ICD-10-CM | POA: Diagnosis not present

## 2016-10-04 DIAGNOSIS — E785 Hyperlipidemia, unspecified: Secondary | ICD-10-CM

## 2016-10-04 DIAGNOSIS — E040 Nontoxic diffuse goiter: Secondary | ICD-10-CM

## 2016-10-04 DIAGNOSIS — I10 Essential (primary) hypertension: Secondary | ICD-10-CM | POA: Diagnosis not present

## 2016-10-04 DIAGNOSIS — E049 Nontoxic goiter, unspecified: Secondary | ICD-10-CM

## 2016-10-04 DIAGNOSIS — E559 Vitamin D deficiency, unspecified: Secondary | ICD-10-CM

## 2016-10-04 DIAGNOSIS — F419 Anxiety disorder, unspecified: Secondary | ICD-10-CM

## 2016-10-04 MED ORDER — ONDANSETRON HCL 4 MG PO TABS: 4.0000 mg | ORAL_TABLET | Freq: Three times a day (TID) | ORAL | 0 refills | Status: DC | PRN

## 2016-10-04 MED ORDER — ALPRAZOLAM 0.5 MG PO TABS: ORAL_TABLET | ORAL | 5 refills | Status: DC

## 2016-10-04 MED ORDER — CIPROFLOXACIN HCL 500 MG PO TABS: 500.0000 mg | ORAL_TABLET | Freq: Two times a day (BID) | ORAL | 0 refills | Status: DC

## 2016-10-04 MED ORDER — SCOPOLAMINE 1 MG/3DAYS TD PT72: 1.0000 | MEDICATED_PATCH | TRANSDERMAL | 1 refills | Status: DC

## 2016-10-04 NOTE — Patient Instructions (Addendum)
I recommend Tommi Rumps of your son in law if he wants a female doctor ( he is my husband's doctor) .  TELL YOUR HUSBAND TO STOP BEING AN ENABLER !!!!    CIPRO ,  ZOFRAN AND SCOPALAMINE ALL SENT TO [facility]

## 2016-10-04 NOTE — Progress Notes (Signed)
Pre visit review using our clinic review tool, if applicable. No additional management support is needed unless otherwise documented below in the visit note. 

## 2016-10-04 NOTE — Progress Notes (Signed)
Subjective:  Patient ID: Pollyann Savoy, female    DOB: 1966/01/20  Age: 51 y.o. MRN: [id number]  CC: The primary encounter diagnosis was Anemia, unspecified type. Diagnoses of Prediabetes, Goiter diffuse, Hyperlipidemia LDL goal <130, Essential hypertension, benign, Vitamin D deficiency, Anxiety, and Insomnia secondary to anxiety were also pertinent to this visit.  HPI Rosalba C Peake presents for follow up on GAD,  Overweight and prediabetes  Has lost 3 lbs since January , joined [employer] watchers but has trouble preparing meals .  Eats early but husband foils her plans by eating poorly in front of her   Going on 2 cruises before July:  1) [place] /[place]    2) [place]   GAD:  Using alprazolam PRN ,  At times 1 mg at a time,  Worried about daughter [name]  Who is being diagnosed with panic attacks.  Insomnia is  managed with ambien cr and elavil,  sometimes has to add alprazolam     Lab Results  Component Value Date   HGBA1C 5.6 07/03/2016      Outpatient Medications Prior to Visit  Medication Sig Dispense Refill  . amitriptyline (ELAVIL) 100 MG tablet TAKE ONE TABLET BY MOUTH AT BEDTIME 90 tablet 2  . cetirizine (ZYRTEC) 10 MG tablet Take 10 mg by mouth daily.      . cyclobenzaprine (FLEXERIL) 10 MG tablet TAKE ONE TABLET 3 TIMES DAILY AS NEEDED FOR MUSCLE SPASMS 90 tablet 3  . Lactobacillus (PROBIOTIC ACIDOPHILUS PO) Take 1 tablet by mouth daily.    Marland Kitchen losartan (COZAAR) 100 MG tablet TAKE ONE TABLET BY MOUTH EVERY DAY 90 tablet 2  . meloxicam (MOBIC) 15 MG tablet TAKE 1 TABLET BY MOUTH DAILY 90 tablet 1  . Multiple Vitamin (MULTIVITAMIN) capsule Take 1 capsule by mouth daily.    Marland Kitchen omeprazole (PRILOSEC) 40 MG capsule Take 1 capsule (40 mg total) by mouth daily. 30 capsule 11  . spironolactone (ALDACTONE) 25 MG tablet TAKE ONE TABLET BY MOUTH EVERY DAY 90 tablet 2  . zolpidem (AMBIEN CR) 12.5 MG CR tablet TAKE ONE TABLET BY MOUTH EVERY EVENING AT BEDTIME AS NEEDED FOR SLEEP 30  tablet 5  . ALPRAZolam (XANAX) 0.5 MG tablet TAKE 2 TABLETS BY MOUTH TWICE DAILY AS NEEDED FOR ANXIETY 60 tablet 3   No facility-administered medications prior to visit.     Review of Systems;  Patient denies headache, fevers, malaise, unintentional weight loss, skin rash, eye pain, sinus congestion and sinus pain, sore throat, dysphagia,  hemoptysis , cough, dyspnea, wheezing, chest pain, palpitations, orthopnea, edema, abdominal pain, nausea, melena, diarrhea, constipation, flank pain, dysuria, hematuria, urinary  Frequency, nocturia, numbness, tingling, seizures,  Focal weakness, Loss of consciousness,  Tremor,depression, anxiety, and suicidal ideation.      Objective:  BP 102/68 (BP Location: Left Arm, Patient Position: Sitting, Cuff Size: Large)   Pulse 97   Temp 98.3 F (36.8 C) (Oral)   Resp 16   Ht 5\' 10"  (1.778 m)   Wt 188 lb 6.4 oz (85.5 kg)   LMP 05/01/2014 (Approximate)   SpO2 97%   BMI 27.03 kg/m   BP Readings from Last 3 Encounters:  10/04/16 102/68  07/03/16 110/78  12/30/15 118/76    Wt Readings from Last 3 Encounters:  10/04/16 188 lb 6.4 oz (85.5 kg)  07/03/16 191 lb (86.6 kg)  12/30/15 192 lb (87.1 kg)    General appearance: alert, cooperative and appears stated age Ears: normal TM's and  external ear canals both ears Throat: lips, mucosa, and tongue normal; teeth and gums normal Neck: no adenopathy, no carotid bruit, supple, symmetrical, trachea midline and thyroid not enlarged, symmetric, no tenderness/mass/nodules Back: symmetric, no curvature. ROM normal. No CVA tenderness. Lungs: clear to auscultation bilaterally Heart: regular rate and rhythm, S1, S2 normal, no murmur, click, rub or gallop Abdomen: soft, non-tender; bowel sounds normal; no masses,  no organomegaly Pulses: 2+ and symmetric Skin: Skin color, texture, turgor normal. No rashes or lesions Lymph nodes: Cervical, supraclavicular, and axillary nodes normal.  Lab Results  Component  Value Date   HGBA1C 5.6 07/03/2016   HGBA1C 5.9 12/30/2015    Lab Results  Component Value Date   CREATININE 0.85 07/03/2016   CREATININE 0.84 12/30/2015   CREATININE 0.78 12/16/2014    Lab Results  Component Value Date   WBC 6.8 12/16/2014   HGB 13.7 12/16/2014   HCT 41.0 12/16/2014   PLT 248.0 12/16/2014   GLUCOSE 105 (H) 07/03/2016   CHOL 204 (H) 12/30/2015   TRIG 116.0 12/30/2015   HDL 43.00 12/30/2015   LDLDIRECT 124.0 07/03/2016   LDLCALC 137 (H) 12/30/2015   ALT 14 07/03/2016   AST 15 07/03/2016   NA 138 07/03/2016   K 4.1 07/03/2016   CL 104 07/03/2016   CREATININE 0.85 07/03/2016   BUN 10 07/03/2016   CO2 27 07/03/2016   TSH 1.13 12/30/2015   HGBA1C 5.6 07/03/2016   MICROALBUR <0.7 12/30/2015     Assessment & Plan:   Problem List Items Addressed This Visit    Anemia - Primary   Relevant Orders   CBC with Differential/Platelet   Anxiety    Managed with prn alprazolam,  And ambien/elavil for insomnia secondary to anxiety.  The risks and benefits of benzodiazepine use were reviewed with patient today including excessive sedation leading to respiratory depression,  impaired thinking/driving, and addiction.  Patient was advised to avoid concurrent use with alcohol, to use medication only as needed and not to share with others        Relevant Medications   ALPRAZolam (XANAX) 0.5 MG tablet   Essential hypertension, benign    Well controlled on current regimen. Renal function stable, no changes today. Lab Results  Component Value Date   CREATININE 0.85 07/03/2016   Lab Results  Component Value Date   NA 138 07/03/2016   K 4.1 07/03/2016   CL 104 07/03/2016   CO2 27 07/03/2016         Goiter diffuse    Last ultrasound was unchanged , 2016 Lab Results  Component Value Date   TSH 1.13 12/30/2015         Relevant Orders   TSH   Hyperlipidemia LDL goal <130   Relevant Orders   LDL cholesterol, direct   Lipid panel   Insomnia secondary to  anxiety    Managed with elavil ane ambien      Relevant Medications   ALPRAZolam (XANAX) 0.5 MG tablet   Prediabetes    Noted reduction in A1c from 5.9  To 5.6  today .  Continue  Low GI diet, regular exercise and weight loss and follow up in 6 months   Lab Results  Component Value Date   HGBA1C 5.6 07/03/2016         Relevant Orders   POCT HgB A1C   Comprehensive metabolic panel   Hemoglobin A1c   Vitamin D deficiency   Relevant Orders   VITAMIN D 25 Hydroxy (Vit-D  Deficiency, Fractures)      I have changed Ms. Masin's ALPRAZolam. I am also having her start on ondansetron, scopolamine, and ciprofloxacin. Additionally, I am having her maintain her cetirizine, multivitamin, Lactobacillus (PROBIOTIC ACIDOPHILUS PO), omeprazole, cyclobenzaprine, meloxicam, zolpidem, losartan, amitriptyline, spironolactone, and docusate sodium.  Meds ordered this encounter  Medications  . docusate sodium (COLACE) 100 MG capsule    Sig: Take 100 mg by mouth 2 (two) times daily.  . ondansetron (ZOFRAN) 4 MG tablet    Sig: Take 1 tablet (4 mg total) by mouth every 8 (eight) hours as needed for nausea or vomiting.    Dispense:  20 tablet    Refill:  0  . scopolamine (TRANSDERM-SCOP, 1.5 MG,) 1 MG/3DAYS    Sig: Place 1 patch (1.5 mg total) onto the skin every 3 (three) days.    Dispense:  10 patch    Refill:  1  . ciprofloxacin (CIPRO) 500 MG tablet    Sig: Take 1 tablet (500 mg total) by mouth 2 (two) times daily. For travelers diarrhea    Dispense:  20 tablet    Refill:  0  . ALPRAZolam (XANAX) 0.5 MG tablet    Sig: TAKE 2 TABLETS BY MOUTH DAILY AS NEEDED FOR ANXIETY    Dispense:  60 tablet    Refill:  5    FOR NEXT FILL. THANK YOU    Medications Discontinued During This Encounter  Medication Reason  . ALPRAZolam (XANAX) 0.5 MG tablet Reorder    A total of 25 minutes of face to face time was spent with patient more than half of which was spent in counselling about the above mentioned  conditions  and coordination of care  Follow-up: No Follow-up on file.   Crecencio Mc, MD

## 2016-10-06 NOTE — Assessment & Plan Note (Signed)
Last ultrasound was unchanged , 2016 Lab Results  Component Value Date   TSH 1.13 12/30/2015

## 2016-10-06 NOTE — Assessment & Plan Note (Signed)
Managed with prn alprazolam,  And ambien/elavil for insomnia secondary to anxiety.  The risks and benefits of benzodiazepine use were reviewed with patient today including excessive sedation leading to respiratory depression,  impaired thinking/driving, and addiction.  Patient was advised to avoid concurrent use with alcohol, to use medication only as needed and not to share with others   

## 2016-10-06 NOTE — Assessment & Plan Note (Signed)
Noted reduction in A1c from 5.9  To 5.6  today .  Continue  Low GI diet, regular exercise and weight loss and follow up in 6 months   Lab Results  Component Value Date   HGBA1C 5.6 07/03/2016

## 2016-10-06 NOTE — Assessment & Plan Note (Signed)
Well controlled on current regimen. Renal function stable, no changes today. Lab Results  Component Value Date   CREATININE 0.85 07/03/2016   Lab Results  Component Value Date   NA 138 07/03/2016   K 4.1 07/03/2016   CL 104 07/03/2016   CO2 27 07/03/2016

## 2016-10-06 NOTE — Assessment & Plan Note (Signed)
Managed with elavil ane Lorrin Mais

## 2016-12-12 ENCOUNTER — Other Ambulatory Visit: Payer: Self-pay | Admitting: Internal Medicine

## 2016-12-25 ENCOUNTER — Other Ambulatory Visit: Payer: BLUE CROSS/BLUE SHIELD

## 2016-12-28 ENCOUNTER — Encounter: Payer: BLUE CROSS/BLUE SHIELD | Admitting: Internal Medicine

## 2017-01-02 ENCOUNTER — Other Ambulatory Visit: Payer: Self-pay | Admitting: Internal Medicine

## 2017-01-02 DIAGNOSIS — Z1231 Encounter for screening mammogram for malignant neoplasm of breast: Secondary | ICD-10-CM

## 2017-01-20 ENCOUNTER — Other Ambulatory Visit: Payer: Self-pay | Admitting: Internal Medicine

## 2017-01-22 ENCOUNTER — Other Ambulatory Visit (INDEPENDENT_AMBULATORY_CARE_PROVIDER_SITE_OTHER): Payer: 59

## 2017-01-22 DIAGNOSIS — R7303 Prediabetes: Secondary | ICD-10-CM

## 2017-01-22 DIAGNOSIS — D649 Anemia, unspecified: Secondary | ICD-10-CM

## 2017-01-22 DIAGNOSIS — E559 Vitamin D deficiency, unspecified: Secondary | ICD-10-CM | POA: Diagnosis not present

## 2017-01-22 DIAGNOSIS — E040 Nontoxic diffuse goiter: Secondary | ICD-10-CM

## 2017-01-22 DIAGNOSIS — E785 Hyperlipidemia, unspecified: Secondary | ICD-10-CM

## 2017-01-22 DIAGNOSIS — E049 Nontoxic goiter, unspecified: Secondary | ICD-10-CM | POA: Diagnosis not present

## 2017-01-22 LAB — CBC WITH DIFFERENTIAL/PLATELET
BASOS PCT: 1.2 % (ref 0.0–3.0)
Basophils Absolute: 0.1 10*3/uL (ref 0.0–0.1)
EOS ABS: 0.2 10*3/uL (ref 0.0–0.7)
Eosinophils Relative: 3.1 % (ref 0.0–5.0)
HCT: 41.9 % (ref 36.0–46.0)
Hemoglobin: 13.8 g/dL (ref 12.0–15.0)
LYMPHS ABS: 2.1 10*3/uL (ref 0.7–4.0)
Lymphocytes Relative: 38 % (ref 12.0–46.0)
MCHC: 33 g/dL (ref 30.0–36.0)
MCV: 91.2 fl (ref 78.0–100.0)
MONO ABS: 0.3 10*3/uL (ref 0.1–1.0)
Monocytes Relative: 5.6 % (ref 3.0–12.0)
NEUTROS PCT: 52.1 % (ref 43.0–77.0)
Neutro Abs: 2.9 10*3/uL (ref 1.4–7.7)
PLATELETS: 311 10*3/uL (ref 150.0–400.0)
RBC: 4.6 Mil/uL (ref 3.87–5.11)
RDW: 13.2 % (ref 11.5–15.5)
WBC: 5.6 10*3/uL (ref 4.0–10.5)

## 2017-01-22 LAB — COMPREHENSIVE METABOLIC PANEL
ALK PHOS: 81 U/L (ref 39–117)
ALT: 10 U/L (ref 0–35)
AST: 12 U/L (ref 0–37)
Albumin: 4.2 g/dL (ref 3.5–5.2)
BUN: 11 mg/dL (ref 6–23)
CHLORIDE: 106 meq/L (ref 96–112)
CO2: 25 mEq/L (ref 19–32)
CREATININE: 0.84 mg/dL (ref 0.40–1.20)
Calcium: 9.7 mg/dL (ref 8.4–10.5)
GFR: 75.33 mL/min (ref >60.00)
GLUCOSE: 117 mg/dL — AB (ref 70–99)
POTASSIUM: 3.8 meq/L (ref 3.5–5.1)
SODIUM: 140 meq/L (ref 135–145)
TOTAL PROTEIN: 7.6 g/dL (ref 6.0–8.3)
Total Bilirubin: 0.4 mg/dL (ref 0.2–1.2)

## 2017-01-22 LAB — LIPID PANEL
CHOL/HDL RATIO: 5
Cholesterol: 209 mg/dL — ABNORMAL HIGH (ref 0–200)
HDL: 44.1 mg/dL (ref >39.00)
LDL CALC: 134 mg/dL — AB (ref 0–99)
NonHDL: 164.51
TRIGLYCERIDES: 152 mg/dL — AB (ref 0.0–149.0)
VLDL: 30.4 mg/dL (ref 0.0–40.0)

## 2017-01-22 LAB — HEMOGLOBIN A1C: Hgb A1c MFr Bld: 5.9 % (ref 4.6–6.5)

## 2017-01-22 LAB — VITAMIN D 25 HYDROXY (VIT D DEFICIENCY, FRACTURES): VITD: 24.93 ng/mL — ABNORMAL LOW (ref 30.00–100.00)

## 2017-01-22 LAB — LDL CHOLESTEROL, DIRECT: Direct LDL: 155 mg/dL

## 2017-01-22 LAB — TSH: TSH: 1.14 u[IU]/mL (ref 0.35–4.50)

## 2017-01-25 ENCOUNTER — Ambulatory Visit (INDEPENDENT_AMBULATORY_CARE_PROVIDER_SITE_OTHER): Payer: 59 | Admitting: Internal Medicine

## 2017-01-25 ENCOUNTER — Encounter: Payer: Self-pay | Admitting: Internal Medicine

## 2017-01-25 ENCOUNTER — Other Ambulatory Visit (HOSPITAL_COMMUNITY)
Admission: RE | Admit: 2017-01-25 | Discharge: 2017-01-25 | Disposition: A | Discharge status: Home / Self Care | Payer: 59 | Source: Ambulatory Visit | Attending: Internal Medicine | Admitting: Internal Medicine

## 2017-01-25 VITALS — BP 106/84 | HR 95 | Temp 98.5°F | Resp 16 | Ht 70.0 in | Wt 186.2 lb

## 2017-01-25 DIAGNOSIS — R7303 Prediabetes: Secondary | ICD-10-CM | POA: Diagnosis not present

## 2017-01-25 DIAGNOSIS — Z124 Encounter for screening for malignant neoplasm of cervix: Secondary | ICD-10-CM | POA: Diagnosis not present

## 2017-01-25 DIAGNOSIS — E663 Overweight: Secondary | ICD-10-CM

## 2017-01-25 DIAGNOSIS — F5105 Insomnia due to other mental disorder: Secondary | ICD-10-CM | POA: Diagnosis not present

## 2017-01-25 DIAGNOSIS — Z0000 Encounter for general adult medical examination without abnormal findings: Secondary | ICD-10-CM | POA: Diagnosis not present

## 2017-01-25 DIAGNOSIS — F419 Anxiety disorder, unspecified: Secondary | ICD-10-CM | POA: Diagnosis not present

## 2017-01-25 DIAGNOSIS — E049 Nontoxic goiter, unspecified: Secondary | ICD-10-CM | POA: Diagnosis not present

## 2017-01-25 DIAGNOSIS — Z1231 Encounter for screening mammogram for malignant neoplasm of breast: Secondary | ICD-10-CM | POA: Diagnosis not present

## 2017-01-25 DIAGNOSIS — R69 Illness, unspecified: Secondary | ICD-10-CM | POA: Diagnosis not present

## 2017-01-25 DIAGNOSIS — E040 Nontoxic diffuse goiter: Secondary | ICD-10-CM

## 2017-01-25 DIAGNOSIS — Z1239 Encounter for other screening for malignant neoplasm of breast: Secondary | ICD-10-CM

## 2017-01-25 NOTE — Patient Instructions (Signed)
Health Maintenance for Postmenopausal Women Menopause is a normal process in which your reproductive ability comes to an end. This process happens gradually over a span of months to years, usually between the ages of 22 and 9. Menopause is complete when you have missed 12 consecutive menstrual periods. It is important to talk with your health care provider about some of the most common conditions that affect postmenopausal women, such as heart disease, cancer, and bone loss (osteoporosis). Adopting a healthy lifestyle and getting preventive care can help to promote your health and wellness. Those actions can also lower your chances of developing some of these common conditions. What should I know about menopause? During menopause, you may experience a number of symptoms, such as:  Moderate-to-severe hot flashes.  Night sweats.  Decrease in sex drive.  Mood swings.  Headaches.  Tiredness.  Irritability.  Memory problems.  Insomnia.  Choosing to treat or not to treat menopausal changes is an individual decision that you make with your health care provider. What should I know about hormone replacement therapy and supplements? Hormone therapy products are effective for treating symptoms that are associated with menopause, such as hot flashes and night sweats. Hormone replacement carries certain risks, especially as you become older. If you are thinking about using estrogen or estrogen with progestin treatments, discuss the benefits and risks with your health care provider. What should I know about heart disease and stroke? Heart disease, heart attack, and stroke become more likely as you age. This may be due, in part, to the hormonal changes that your body experiences during menopause. These can affect how your body processes dietary fats, triglycerides, and cholesterol. Heart attack and stroke are both medical emergencies. There are many things that you can do to help prevent heart disease  and stroke:  Have your blood pressure checked at least every 1-2 years. High blood pressure causes heart disease and increases the risk of stroke.  If you are 53-22 years old, ask your health care provider if you should take aspirin to prevent a heart attack or a stroke.  Do not use any tobacco products, including cigarettes, chewing tobacco, or electronic cigarettes. If you need help quitting, ask your health care provider.  It is important to eat a healthy diet and maintain a healthy weight. ? Be sure to include plenty of vegetables, fruits, low-fat dairy products, and lean protein. ? Avoid eating foods that are high in solid fats, added sugars, or salt (sodium).  Get regular exercise. This is one of the most important things that you can do for your health. ? Try to exercise for at least 150 minutes each week. The type of exercise that you do should increase your heart rate and make you sweat. This is known as moderate-intensity exercise. ? Try to do strengthening exercises at least twice each week. Do these in addition to the moderate-intensity exercise.  Know your numbers.Ask your health care provider to check your cholesterol and your blood glucose. Continue to have your blood tested as directed by your health care provider.  What should I know about cancer screening? There are several types of cancer. Take the following steps to reduce your risk and to catch any cancer development as early as possible. Breast Cancer  Practice breast self-awareness. ? This means understanding how your breasts normally appear and feel. ? It also means doing regular breast self-exams. Let your health care provider know about any changes, no matter how small.  If you are 40  or older, have a clinician do a breast exam (clinical breast exam or CBE) every year. Depending on your age, family history, and medical history, it may be recommended that you also have a yearly breast X-ray (mammogram).  If you  have a family history of breast cancer, talk with your health care provider about genetic screening.  If you are at high risk for breast cancer, talk with your health care provider about having an MRI and a mammogram every year.  Breast cancer (BRCA) gene test is recommended for women who have family members with BRCA-related cancers. Results of the assessment will determine the need for genetic counseling and BRCA1 and for BRCA2 testing. BRCA-related cancers include these types: ? Breast. This occurs in males or females. ? Ovarian. ? Tubal. This may also be called fallopian tube cancer. ? Cancer of the abdominal or pelvic lining (peritoneal cancer). ? Prostate. ? Pancreatic.  Cervical, Uterine, and Ovarian Cancer Your health care provider may recommend that you be screened regularly for cancer of the pelvic organs. These include your ovaries, uterus, and vagina. This screening involves a pelvic exam, which includes checking for microscopic changes to the surface of your cervix (Pap test).  For women ages 21-65, health care providers may recommend a pelvic exam and a Pap test every three years. For women ages 79-65, they may recommend the Pap test and pelvic exam, combined with testing for human papilloma virus (HPV), every five years. Some types of HPV increase your risk of cervical cancer. Testing for HPV may also be done on women of any age who have unclear Pap test results.  Other health care providers may not recommend any screening for nonpregnant women who are considered low risk for pelvic cancer and have no symptoms. Ask your health care provider if a screening pelvic exam is right for you.  If you have had past treatment for cervical cancer or a condition that could lead to cancer, you need Pap tests and screening for cancer for at least 20 years after your treatment. If Pap tests have been discontinued for you, your risk factors (such as having a new sexual partner) need to be  reassessed to determine if you should start having screenings again. Some women have medical problems that increase the chance of getting cervical cancer. In these cases, your health care provider may recommend that you have screening and Pap tests more often.  If you have a family history of uterine cancer or ovarian cancer, talk with your health care provider about genetic screening.  If you have vaginal bleeding after reaching menopause, tell your health care provider.  There are currently no reliable tests available to screen for ovarian cancer.  Lung Cancer Lung cancer screening is recommended for adults 69-62 years old who are at high risk for lung cancer because of a history of smoking. A yearly low-dose CT scan of the lungs is recommended if you:  Currently smoke.  Have a history of at least 30 pack-years of smoking and you currently smoke or have quit within the past 15 years. A pack-year is smoking an average of one pack of cigarettes per day for one year.  Yearly screening should:  Continue until it has been 15 years since you quit.  Stop if you develop a health problem that would prevent you from having lung cancer treatment.  Colorectal Cancer  This type of cancer can be detected and can often be prevented.  Routine colorectal cancer screening usually begins at  age 42 and continues through age 45.  If you have risk factors for colon cancer, your health care provider may recommend that you be screened at an earlier age.  If you have a family history of colorectal cancer, talk with your health care provider about genetic screening.  Your health care provider may also recommend using home test kits to check for hidden blood in your stool.  A small camera at the end of a tube can be used to examine your colon directly (sigmoidoscopy or colonoscopy). This is done to check for the earliest forms of colorectal cancer.  Direct examination of the colon should be repeated every  5-10 years until age 71. However, if early forms of precancerous polyps or small growths are found or if you have a family history or genetic risk for colorectal cancer, you may need to be screened more often.  Skin Cancer  Check your skin from head to toe regularly.  Monitor any moles. Be sure to tell your health care provider: ? About any new moles or changes in moles, especially if there is a change in a mole's shape or color. ? If you have a mole that is larger than the size of a pencil eraser.  If any of your family members has a history of skin cancer, especially at a young age, talk with your health care provider about genetic screening.  Always use sunscreen. Apply sunscreen liberally and repeatedly throughout the day.  Whenever you are outside, protect yourself by wearing long sleeves, pants, a wide-brimmed hat, and sunglasses.  What should I know about osteoporosis? Osteoporosis is a condition in which bone destruction happens more quickly than new bone creation. After menopause, you may be at an increased risk for osteoporosis. To help prevent osteoporosis or the bone fractures that can happen because of osteoporosis, the following is recommended:  If you are 46-71 years old, get at least 1,000 mg of calcium and at least 600 mg of vitamin D per day.  If you are older than age 55 but younger than age 65, get at least 1,200 mg of calcium and at least 600 mg of vitamin D per day.  If you are older than age 54, get at least 1,200 mg of calcium and at least 800 mg of vitamin D per day.  Smoking and excessive alcohol intake increase the risk of osteoporosis. Eat foods that are rich in calcium and vitamin D, and do weight-bearing exercises several times each week as directed by your health care provider. What should I know about how menopause affects my mental health? Depression may occur at any age, but it is more common as you become older. Common symptoms of depression  include:  Low or sad mood.  Changes in sleep patterns.  Changes in appetite or eating patterns.  Feeling an overall lack of motivation or enjoyment of activities that you previously enjoyed.  Frequent crying spells.  Talk with your health care provider if you think that you are experiencing depression. What should I know about immunizations? It is important that you get and maintain your immunizations. These include:  Tetanus, diphtheria, and pertussis (Tdap) booster vaccine.  Influenza every year before the flu season begins.  Pneumonia vaccine.  Shingles vaccine.  Your health care provider may also recommend other immunizations. This information is not intended to replace advice given to you by your health care provider. Make sure you discuss any questions you have with your health care provider. Document Released: 07/14/2005  Document Revised: 12/10/2015 Document Reviewed: 02/23/2015 Elsevier Interactive Patient Education  2018 [employer]  

## 2017-01-25 NOTE — Progress Notes (Addendum)
Patient ID: Dawn Griffith, female    DOB: 01/01/1966  Age: 51 y.o. MRN: [id number]  The patient is here for annual preventive examination and management of other chronic and acute problems.   Mammogram next week  PAP done today  Colonoscopy normal 2012   The risk factors are reflected in the social history.  The roster of all physicians providing medical care to patient - is listed in the Snapshot section of the chart.  Activities of daily living:  The patient is 100% independent in all ADLs: dressing, toileting, feeding as well as independent mobility  Home safety : The patient has smoke detectors in the home. They wear seatbelts.  There are no firearms at home. There is no violence in the home.   There is no risks for hepatitis, STDs or HIV. There is no   history of blood transfusion. They have no travel history to infectious disease endemic areas of the world.  The patient has seen their dentist in the last six month. They have seen their eye doctor in the last year.    Discussed the need for sun protection: hats, long sleeves and use of sunscreen if there is significant sun exposure.   Diet: the importance of a healthy diet is discussed. They do have a healthy diet.  The benefits of regular aerobic exercise were discussed. She walks 4 times per week ,  20 minutes.   Depression screen: there are no signs or vegative symptoms of depression- irritability, change in appetite, anhedonia, sadness/tearfullness.  Cognitive assessment: the patient manages all their financial and personal affairs and is actively engaged. They could relate day,date,year and events; recalled 2/3 objects at 3 minutes; performed clock-face test normally.  The following portions of the patient's history were reviewed and updated as appropriate: allergies, current medications, past family history, past medical history,  past surgical history, past social history  and problem list.  Visual acuity was not assessed  per patient preference since she has regular follow up with her ophthalmologist. Hearing and body mass index were assessed and reviewed.   During the course of the visit the patient was educated and counseled about appropriate screening and preventive services including : fall prevention , diabetes screening, nutrition counseling, colorectal cancer screening, and recommended immunizations.    CC: The primary encounter diagnosis was Screening breast examination. Diagnoses of Cervical cancer screening, Prediabetes, Overweight (BMI 25.0-29.9), Insomnia secondary to anxiety, Goiter diffuse, and Encounter for preventive health examination were also pertinent to this visit.   Anxiety,  Lots of stressors.  Husband losing his job due to a back injury/workmen's comp injury ,  Which means she will lose her health insurance.  Parents having memory problems .  She has been using xanax twice daily Still trying to lose weight,  Started the green smoothie diet less than a week ago.   Recently went on a Cruise to [place] ,  gained weight   pushmowing the yard 3 times per week for exercise.,   Had shingrix   [name] has a past medical history of Cancer ([place]) and Hypertension.   She has a past surgical history that includes Cesarean section (1992); Ectopic pregnancy surgery (1997); Lymph node biopsy; Nasal sinus surgery; Colonoscopy; and Cholecystectomy (Dec 2012).   Her family history includes Arrhythmia in her mother; Colon polyps in her mother; Diabetes in her father; Hypertension in her brother, father, and mother; Kidney disease in her mother; Uterine cancer in her mother.She reports that she has never  smoked. She has never used smokeless tobacco. She reports that she does not drink alcohol or use drugs.  Outpatient Medications Prior to Visit  Medication Sig Dispense Refill  . ALPRAZolam (XANAX) 0.5 MG tablet TAKE 2 TABLETS BY MOUTH DAILY AS NEEDED FOR ANXIETY 60 tablet 5  . amitriptyline (ELAVIL)  100 MG tablet TAKE ONE TABLET BY MOUTH AT BEDTIME 90 tablet 2  . cetirizine (ZYRTEC) 10 MG tablet Take 10 mg by mouth daily.      Marland Kitchen docusate sodium (COLACE) 100 MG capsule Take 100 mg by mouth 2 (two) times daily.    . Lactobacillus (PROBIOTIC ACIDOPHILUS PO) Take 1 tablet by mouth daily.    Marland Kitchen losartan (COZAAR) 100 MG tablet TAKE ONE TABLET BY MOUTH EVERY DAY 90 tablet 2  . omeprazole (PRILOSEC) 40 MG capsule TAKE 1 CAPSULE BY MOUTH DAILY 30 capsule 5  . spironolactone (ALDACTONE) 25 MG tablet TAKE ONE TABLET BY MOUTH EVERY DAY 90 tablet 0  . zolpidem (AMBIEN CR) 12.5 MG CR tablet TAKE ONE TABLET EVERY EVENING AT BEDTIMEAS NEEDED FOR SLEEP 30 tablet 2  . cyclobenzaprine (FLEXERIL) 10 MG tablet TAKE ONE TABLET 3 TIMES DAILY AS NEEDED FOR MUSCLE SPASMS (Patient not taking: Reported on 01/25/2017) 90 tablet 3  . meloxicam (MOBIC) 15 MG tablet TAKE 1 TABLET BY MOUTH DAILY (Patient not taking: Reported on 01/25/2017) 90 tablet 1  . ciprofloxacin (CIPRO) 500 MG tablet Take 1 tablet (500 mg total) by mouth 2 (two) times daily. For travelers diarrhea (Patient not taking: Reported on 01/25/2017) 20 tablet 0  . Multiple Vitamin (MULTIVITAMIN) capsule Take 1 capsule by mouth daily.    . ondansetron (ZOFRAN) 4 MG tablet Take 1 tablet (4 mg total) by mouth every 8 (eight) hours as needed for nausea or vomiting. (Patient not taking: Reported on 01/25/2017) 20 tablet 0  . scopolamine (TRANSDERM-SCOP, 1.5 MG,) 1 MG/3DAYS Place 1 patch (1.5 mg total) onto the skin every 3 (three) days. (Patient not taking: Reported on 01/25/2017) 10 patch 1   No facility-administered medications prior to visit.     Review of Systems   Patient denies headache, fevers, malaise, unintentional weight loss, skin rash, eye pain, sinus congestion and sinus pain, sore throat, dysphagia,  hemoptysis , cough, dyspnea, wheezing, chest pain, palpitations, orthopnea, edema, abdominal pain, nausea, melena, diarrhea, constipation, flank pain,  dysuria, hematuria, urinary  Frequency, nocturia, numbness, tingling, seizures,  Focal weakness, Loss of consciousness,  Tremor, , depression, and suicidal ideation.      Objective:  BP 106/84 (BP Location: Left Arm, Patient Position: Sitting, Cuff Size: Normal)   Pulse 95   Temp 98.5 F (36.9 C) (Oral)   Resp 16   Ht 5\' 10"  (1.778 m)   Wt 186 lb 3.2 oz (84.5 kg)   LMP 05/01/2014 (Approximate)   SpO2 95%   BMI 26.72 kg/m   Physical Exam   General Appearance:    Alert, cooperative, no distress, appears stated age  Head:    Normocephalic, without obvious abnormality, atraumatic  Eyes:    PERRL, conjunctiva/corneas clear, EOM's intact, fundi    benign, both eyes  Ears:    Normal TM's and external ear canals, both ears  Nose:   Nares normal, septum midline, mucosa normal, no drainage    or sinus tenderness  Throat:   Lips, mucosa, and tongue normal; teeth and gums normal  Neck:   Supple, symmetrical, trachea midline, no adenopathy;    thyroid:  no enlargement/tenderness/nodules; no carotid  bruit or JVD  Back:     Symmetric, no curvature, ROM normal, no CVA tenderness  Lungs:     Clear to auscultation bilaterally, respirations unlabored  Chest Wall:    No tenderness or deformity   Heart:    Regular rate and rhythm, S1 and S2 normal, no murmur, rub   or gallop  Breast Exam:    No tenderness, masses, or nipple abnormality  Abdomen:     Soft, non-tender, bowel sounds active all four quadrants,    no masses, no organomegaly  Genitalia:    Pelvic: cervix normal in appearance, external genitalia normal, no adnexal masses or tenderness, no cervical motion tenderness, rectovaginal septum normal, uterus normal size, shape, and consistency and vagina normal without discharge  Extremities:   Extremities normal, atraumatic, no cyanosis or edema  Pulses:   2+ and symmetric all extremities  Skin:   Skin color, texture, turgor normal, no rashes or lesions  Lymph nodes:   Cervical,  supraclavicular, and axillary nodes normal  Neurologic:   CNII-XII intact, normal strength, sensation and reflexes    throughout      Assessment & Plan:   Problem List Items Addressed This Visit    Encounter for preventive health examination    Annual comprehensive preventive exam was done as well as an evaluation and management of chronic conditions .  During the course of the visit the patient was educated and counseled about appropriate screening and preventive services including :  diabetes screening, lipid analysis with projected  10 year  risk for CAD , nutrition counseling, breast, cervical and colorectal cancer screening, and recommended immunizations.  Printed recommendations for health maintenance screenings was given      Goiter diffuse    Last ultrasound was unchanged , 2016. Thyroid function is normal.  Lab Results  Component Value Date   TSH 1.14 01/22/2017         Insomnia secondary to anxiety    Managed with elavil and ambien.  No changes today      Overweight (BMI 25.0-29.9)    I have congratulated her in reduction of   BMI and encouraged  Continued weight loss with goal of 10% of body weight over the next 6 months using a low glycemic index diet and regular exercise a minimum of 5 days per week.        Prediabetes    Noted increase in A1c from 5.6  To 5.9  today .  Advised re resume  Low GI diet, increase participation oin regular exercise and weight loss and follow up in 6 months   Lab Results  Component Value Date   HGBA1C 5.9 01/22/2017          Other Visit Diagnoses    Screening breast examination    -  Primary   Cervical cancer screening       Relevant Orders   Cytology - PAP (Completed)      I have discontinued Ms. Stevick multivitamin, ondansetron, scopolamine, and ciprofloxacin. I am also having her maintain her cetirizine, Lactobacillus (PROBIOTIC ACIDOPHILUS PO), cyclobenzaprine, meloxicam, losartan, amitriptyline, docusate sodium,  ALPRAZolam, spironolactone, omeprazole, zolpidem, and cholecalciferol.  Meds ordered this encounter  Medications  . cholecalciferol (VITAMIN D) 1000 units tablet    Sig: Take 1,000 Units by mouth daily.    Medications Discontinued During This Encounter  Medication Reason  . ciprofloxacin (CIPRO) 500 MG tablet Patient has not taken in last 30 days  . Multiple Vitamin (MULTIVITAMIN) capsule Patient has  not taken in last 30 days  . ondansetron (ZOFRAN) 4 MG tablet Patient has not taken in last 30 days  . scopolamine (TRANSDERM-SCOP, 1.5 MG,) 1 MG/3DAYS Patient has not taken in last 30 days    Follow-up: Return in about 3 months (around 04/27/2017).   Crecencio Mc, MD

## 2017-01-26 LAB — CYTOLOGY - PAP
DIAGNOSIS: NEGATIVE
HPV (WINDOPATH): NOT DETECTED

## 2017-01-27 NOTE — Assessment & Plan Note (Signed)
Managed with elavil and ambien.  No changes today

## 2017-01-27 NOTE — Assessment & Plan Note (Signed)
Last ultrasound was unchanged , 2016. Thyroid function is normal.  Lab Results  Component Value Date   TSH 1.14 01/22/2017

## 2017-01-27 NOTE — Assessment & Plan Note (Signed)
Annual comprehensive preventive exam was done as well as an evaluation and management of chronic conditions .  During the course of the visit the patient was educated and counseled about appropriate screening and preventive services including :  diabetes screening, lipid analysis with projected  10 year  risk for CAD , nutrition counseling, breast, cervical and colorectal cancer screening, and recommended immunizations.  Printed recommendations for health maintenance screenings was given 

## 2017-01-27 NOTE — Assessment & Plan Note (Signed)
I have congratulated her in reduction of   BMI and encouraged  Continued weight loss with goal of 10% of body weight over the next 6 months using a low glycemic index diet and regular exercise a minimum of 5 days per week.     

## 2017-01-27 NOTE — Assessment & Plan Note (Signed)
Noted increase in A1c from 5.6  To 5.9  today .  Advised re resume  Low GI diet, increase participation oin regular exercise and weight loss and follow up in 6 months   Lab Results  Component Value Date   HGBA1C 5.9 01/22/2017

## 2017-01-28 ENCOUNTER — Encounter: Payer: Self-pay | Admitting: Internal Medicine

## 2017-01-29 ENCOUNTER — Ambulatory Visit
Admission: RE | Admit: 2017-01-29 | Discharge: 2017-01-29 | Disposition: A | Discharge status: Home / Self Care | Payer: 59 | Source: Ambulatory Visit | Attending: Internal Medicine | Admitting: Internal Medicine

## 2017-01-29 DIAGNOSIS — Z1231 Encounter for screening mammogram for malignant neoplasm of breast: Secondary | ICD-10-CM | POA: Insufficient documentation

## 2017-02-12 DIAGNOSIS — D229 Melanocytic nevi, unspecified: Secondary | ICD-10-CM | POA: Diagnosis not present

## 2017-02-12 DIAGNOSIS — L820 Inflamed seborrheic keratosis: Secondary | ICD-10-CM | POA: Diagnosis not present

## 2017-04-09 ENCOUNTER — Other Ambulatory Visit: Payer: Self-pay | Admitting: Internal Medicine

## 2017-04-19 ENCOUNTER — Ambulatory Visit: Payer: 59 | Admitting: Internal Medicine

## 2017-04-19 ENCOUNTER — Encounter: Payer: Self-pay | Admitting: Internal Medicine

## 2017-04-19 DIAGNOSIS — E049 Nontoxic goiter, unspecified: Secondary | ICD-10-CM

## 2017-04-19 DIAGNOSIS — E663 Overweight: Secondary | ICD-10-CM

## 2017-04-19 DIAGNOSIS — R69 Illness, unspecified: Secondary | ICD-10-CM | POA: Diagnosis not present

## 2017-04-19 DIAGNOSIS — E040 Nontoxic diffuse goiter: Secondary | ICD-10-CM

## 2017-04-19 DIAGNOSIS — F5105 Insomnia due to other mental disorder: Secondary | ICD-10-CM | POA: Diagnosis not present

## 2017-04-19 DIAGNOSIS — R7303 Prediabetes: Secondary | ICD-10-CM

## 2017-04-19 DIAGNOSIS — F419 Anxiety disorder, unspecified: Secondary | ICD-10-CM | POA: Diagnosis not present

## 2017-04-19 MED ORDER — ALPRAZOLAM 0.5 MG PO TABS: ORAL_TABLET | ORAL | 5 refills | Status: DC

## 2017-04-19 NOTE — Assessment & Plan Note (Signed)
Her random glucose was elevated but not diagnostic of diabetes .  I recommend she follow a low glycemic index diet and particpate regularly in an aerobic  exercise activity.  We should check an A1c in 6 months.

## 2017-04-19 NOTE — Patient Instructions (Signed)

## 2017-04-19 NOTE — Progress Notes (Signed)
Subjective:  Patient ID: Dawn Griffith, female    DOB: [birth year]  Age: 51 y.o. MRN: [id number]  CC: Diagnoses of Prediabetes, Insomnia secondary to anxiety, Overweight (BMI 25.0-29.9), and Goiter diffuse were pertinent to this visit.  HPI Dawn Griffith presents for FOLLOW UP ON GAD, prediabetes, and  Insomnia.  Last seen in august   Has gained 5 lbs since visit in august  .  Stressed out .  Her Husband about to lose his job  She is stress eating and not exercising.   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  Work environment also strained by the lack of income  bc business is slow.     Outpatient Medications Prior to Visit  Medication Sig Dispense Refill  . amitriptyline (ELAVIL) 100 MG tablet TAKE ONE TABLET BY MOUTH AT BEDTIME 90 tablet 2  . cetirizine (ZYRTEC) 10 MG tablet Take 10 mg by mouth daily.      . cholecalciferol (VITAMIN D) 1000 units tablet Take 1,000 Units by mouth daily.    . cyclobenzaprine (FLEXERIL) 10 MG tablet TAKE ONE TABLET 3 TIMES DAILY AS NEEDED FOR MUSCLE SPASMS 90 tablet 3  . docusate sodium (COLACE) 100 MG capsule Take 100 mg by mouth 2 (two) times daily.    . Lactobacillus (PROBIOTIC ACIDOPHILUS PO) Take 1 tablet by mouth daily.    Marland Kitchen losartan (COZAAR) 100 MG tablet TAKE ONE TABLET EVERY DAY 90 tablet 2  . meloxicam (MOBIC) 15 MG tablet TAKE 1 TABLET BY MOUTH DAILY 90 tablet 1  . omeprazole (PRILOSEC) 40 MG capsule TAKE 1 CAPSULE BY MOUTH DAILY 30 capsule 5  . spironolactone (ALDACTONE) 25 MG tablet TAKE ONE TABLET BY MOUTH EVERY DAY 90 tablet 2  . zolpidem (AMBIEN CR) 12.5 MG CR tablet TAKE ONE TABLET EVERY EVENING AT BEDTIMEAS NEEDED FOR SLEEP 30 tablet 2  . ALPRAZolam (XANAX) 0.5 MG tablet TAKE 2 TABLETS BY MOUTH DAILY AS NEEDED FOR ANXIETY 60 tablet 5   No facility-administered medications prior to visit.     Review of Systems;  Patient denies headache,  fevers, malaise, unintentional weight loss, skin rash, eye pain, sinus congestion and sinus pain, sore throat, dysphagia,  hemoptysis , cough, dyspnea, wheezing, chest pain, palpitations, orthopnea, edema, abdominal pain, nausea, melena, diarrhea, constipation, flank pain, dysuria, hematuria, urinary  Frequency, nocturia, numbness, tingling, seizures,  Focal weakness, Loss of consciousness,  Tremor, insomnia, depression, anxiety, and suicidal ideation.      Objective:  BP 104/66 (BP Location: Left Arm, Patient Position: Sitting, Cuff Size: Large)   Pulse 87   Temp 98.5 F (36.9 C) (Oral)   Resp 15   Ht 5\' 10"  (1.778 m)   Wt 191 lb 12.8 oz (87 kg)   LMP 05/01/2014 (Approximate)   SpO2 98%   BMI 27.52 kg/m   BP Readings from Last 3 Encounters:  04/19/17 104/66  01/25/17 106/84  10/04/16 102/68    Wt Readings from Last 3 Encounters:  04/19/17 191 lb 12.8 oz (87 kg)  01/25/17 186 lb 3.2 oz (84.5 kg)  10/04/16 188 lb 6.4 oz (85.5 kg)    General appearance: alert, cooperative and appears stated age Ears: normal TM's and external ear canals both ears Throat: lips, mucosa, and tongue normal; teeth and gums normal Neck: no adenopathy, no carotid bruit, supple, symmetrical, trachea midline and thyroid not enlarged, symmetric, no tenderness/mass/nodules  Back: symmetric, no curvature. ROM normal. No CVA tenderness. Lungs: clear to auscultation bilaterally Heart: regular rate and rhythm, S1, S2 normal, no murmur, click, rub or gallop Abdomen: soft, non-tender; bowel sounds normal; no masses,  no organomegaly Pulses: 2+ and symmetric Skin: Skin color, texture, turgor normal. No rashes or lesions Lymph nodes: Cervical, supraclavicular, and axillary nodes normal.  Lab Results  Component Value Date   HGBA1C 5.9 01/22/2017   HGBA1C 5.6 07/03/2016   HGBA1C 5.9 12/30/2015    Lab Results  Component Value Date   CREATININE 0.84 01/22/2017   CREATININE 0.85 07/03/2016   CREATININE 0.84  12/30/2015    Lab Results  Component Value Date   WBC 5.6 01/22/2017   HGB 13.8 01/22/2017   HCT 41.9 01/22/2017   PLT 311.0 01/22/2017   GLUCOSE 117 (H) 01/22/2017   CHOL 209 (H) 01/22/2017   TRIG 152.0 (H) 01/22/2017   HDL 44.10 01/22/2017   LDLDIRECT 155.0 01/22/2017   LDLCALC 134 (H) 01/22/2017   ALT 10 01/22/2017   AST 12 01/22/2017   NA 140 01/22/2017   K 3.8 01/22/2017   CL 106 01/22/2017   CREATININE 0.84 01/22/2017   BUN 11 01/22/2017   CO2 25 01/22/2017   TSH 1.14 01/22/2017   HGBA1C 5.9 01/22/2017   MICROALBUR <0.7 12/30/2015    No results found.  Assessment & Plan:   Problem List Items Addressed This Visit    Goiter diffuse    Last ultrasound was unchanged , 2016. Thyroid function is normal.  Lab Results  Component Value Date   TSH 1.14 01/22/2017         Insomnia secondary to anxiety    Managed with elavil and ambien.  No changes today.  encouraged to add daily exercise       Relevant Medications   ALPRAZolam (XANAX) 0.5 MG tablet   Overweight (BMI 25.0-29.9)    I have addressed  BMI and recommended a low glycemic index diet utilizing smaller more frequent meals to increase metabolism.  I have also recommended that patient start exercising with a goal of 30 minutes of aerobic exercise a minimum of 5 days per week.       Prediabetes    Her random glucose was elevated but not diagnostic of diabetes .  I recommend she follow a low glycemic index diet and particpate regularly in an aerobic  exercise activity.  We should check an A1c in 6 months.         A total of 25 minutes of face to face time was spent with patient more than half of which was spent in counselling about the above mentioned conditions  and coordination of care   I am having Dawn Griffith maintain her cetirizine, Lactobacillus (PROBIOTIC ACIDOPHILUS PO), cyclobenzaprine, meloxicam, amitriptyline, docusate sodium, omeprazole, zolpidem, cholecalciferol, losartan, spironolactone, and  ALPRAZolam.  Meds ordered this encounter  Medications  . ALPRAZolam (XANAX) 0.5 MG tablet    Sig: TAKE 2 TABLETS BY MOUTH DAILY AS NEEDED FOR ANXIETY    Dispense:  60 tablet    Refill:  5    FOR NEXT FILL. THANK YOU    Medications Discontinued During This Encounter  Medication Reason  . ALPRAZolam (XANAX) 0.5 MG tablet Reorder    Follow-up: Return in about 6 months (around 10/17/2017).   Crecencio Mc, MD

## 2017-04-21 NOTE — Assessment & Plan Note (Signed)
I have addressed  BMI and recommended a low glycemic index diet utilizing smaller more frequent meals to increase metabolism.  I have also recommended that patient start exercising with a goal of 30 minutes of aerobic exercise a minimum of 5 days per week.  

## 2017-04-21 NOTE — Assessment & Plan Note (Signed)
Managed with elavil and ambien.  No changes today.  encouraged to add daily exercise  

## 2017-04-21 NOTE — Assessment & Plan Note (Signed)
Last ultrasound was unchanged , 2016. Thyroid function is normal.  Lab Results  Component Value Date   TSH 1.14 01/22/2017

## 2017-05-01 ENCOUNTER — Ambulatory Visit: Payer: 59 | Admitting: Internal Medicine

## 2017-05-03 ENCOUNTER — Telehealth: Payer: Self-pay

## 2017-05-03 DIAGNOSIS — Z0189 Encounter for other specified special examinations: Secondary | ICD-10-CM

## 2017-05-03 NOTE — Telephone Encounter (Signed)
faxed

## 2017-05-03 NOTE — Telephone Encounter (Signed)
signed

## 2017-05-03 NOTE — Addendum Note (Signed)
Addended by: Crecencio Mc on: 05/03/2017 02:27 PM   Modules accepted: Orders

## 2017-05-03 NOTE — Telephone Encounter (Signed)
Form has been placed in quick sign folder.  

## 2017-05-03 NOTE — Telephone Encounter (Signed)
Copied from [place] [id number]. Topic: Inquiry >> May 03, 2017  9:01 AM Bea Graff, NT wrote: Reason for CRM: Patient is faxing over [employer] and a tobacco use affidavit over that needs to be signed and faxed back by tomorrow. Dr. Derrel Nip has already filled out the [employer] form if she can just fax back that form with the tobacco affidavit form. Fax#: [phone]

## 2017-05-18 ENCOUNTER — Ambulatory Visit: Payer: Self-pay

## 2017-05-18 NOTE — Telephone Encounter (Signed)
  Reason for Disposition . Mild constipation  Answer Assessment - Initial Assessment Questions 1. STOOL PATTERN OR FREQUENCY: "How often do you pass bowel movements (BMs)?"  (Normal range: tid to q 3 days)  "When was the last BM passed?"       Every 2-3 days 2. STRAINING: "Do you have to strain to have a BM?"      No 3. RECTAL PAIN: "Does your rectum hurt when the stool comes out?" If so, ask: "Do you have hemorrhoids? How bad is the pain?"  (Scale 1-10; or mild, moderate, severe)     No 4. STOOL COMPOSITION: "Are the stools hard?"      Soft stool x 1 Tuesday 5. BLOOD ON STOOLS: "Has there been any blood on the toilet tissue or on the surface of the BM?" If so, ask: "When was the last time?"      No 6. CHRONIC CONSTIPATION: "Is this a new problem for you?"  If no, ask: How long have you had this problem?" (days, weeks, months)      Months ago 7. CHANGES IN DIET: "Have there been any recent changes in your diet?"      No 8. MEDICATIONS: "Have you been taking any new medications?"     No 9. LAXATIVES: "Have you been using any laxatives or enemas?"  If yes, ask "What, how often, and when was the last time?"     No 10. CAUSE: "What do you think is causing the constipation?"        Don't know 11. OTHER SYMPTOMS: "Do you have any other symptoms?" (e.g., abdominal pain, fever, vomiting)       Just feels full in upper abdomen 12. PREGNANCY: "Is there any chance you are pregnant?" "When was your last menstrual period?"       No  Protocols used: CONSTIPATION-A-AH Pt. States she has had chronic constipation in the past and will take Dulcolax, which has helped in the past. Pt. Is in agreement with this.

## 2017-06-14 ENCOUNTER — Other Ambulatory Visit: Payer: Self-pay | Admitting: Internal Medicine

## 2017-06-21 ENCOUNTER — Encounter: Payer: Self-pay | Admitting: Internal Medicine

## 2017-06-21 ENCOUNTER — Telehealth: Payer: Self-pay | Admitting: Internal Medicine

## 2017-06-21 ENCOUNTER — Ambulatory Visit: Payer: 59 | Admitting: Internal Medicine

## 2017-06-21 VITALS — BP 124/88 | HR 96 | Temp 99.0°F | Resp 18 | Ht 70.0 in | Wt 190.1 lb

## 2017-06-21 DIAGNOSIS — R0982 Postnasal drip: Secondary | ICD-10-CM

## 2017-06-21 DIAGNOSIS — R05 Cough: Secondary | ICD-10-CM

## 2017-06-21 DIAGNOSIS — K5904 Chronic idiopathic constipation: Secondary | ICD-10-CM | POA: Diagnosis not present

## 2017-06-21 DIAGNOSIS — J019 Acute sinusitis, unspecified: Secondary | ICD-10-CM | POA: Insufficient documentation

## 2017-06-21 DIAGNOSIS — R059 Cough, unspecified: Secondary | ICD-10-CM

## 2017-06-21 DIAGNOSIS — J309 Allergic rhinitis, unspecified: Secondary | ICD-10-CM | POA: Diagnosis not present

## 2017-06-21 MED ORDER — LINACLOTIDE 145 MCG PO CAPS: 145.0000 ug | ORAL_CAPSULE | Freq: Every day | ORAL | 1 refills | Status: DC

## 2017-06-21 MED ORDER — HYDROCOD POLST-CPM POLST ER 10-8 MG/5ML PO SUER: 5.0000 mL | Freq: Every evening | ORAL | 0 refills | Status: DC | PRN

## 2017-06-21 MED ORDER — MONTELUKAST SODIUM 10 MG PO TABS: 10.0000 mg | ORAL_TABLET | Freq: Every day | ORAL | 1 refills | Status: DC

## 2017-06-21 NOTE — Telephone Encounter (Signed)
I did not prescribe it  Dr Aundra Dubin DID

## 2017-06-21 NOTE — Telephone Encounter (Unsigned)
Copied from [place] [id number]. Topic: General - Other >> Jun 21, 2017 11:32 AM Neva Seat wrote: Tussionex- Chlorpheniramine hydrocodone  suspension -473 ml   Pt was given a 95 day supply - Pharmacist needs to verify the refill for the 95 day supply since it's hydrocodone.  [place] [phone]

## 2017-06-21 NOTE — Telephone Encounter (Signed)
Spoke with pharmacist clarified 5 ml qhs prn #[phone] as she had previously expired bottle from 2016 and still has some left over. Not c/w abuse of the medication   Thanks [place]

## 2017-06-21 NOTE — Telephone Encounter (Signed)
Pharmacy needs to verify refill of Tussionex 95 day supply.

## 2017-06-21 NOTE — Telephone Encounter (Signed)
Was the rx supposed to be for a 95 day supply or for just a 5 day supply? Please advise

## 2017-06-21 NOTE — Patient Instructions (Signed)
Try Linzess for constipation  If that does not work Office manager over the [employer]  to continue Colace if needed stool softner Try Align probiotics   Try Singulair for nasal symptoms    Constipation, Adult Constipation is when a person has fewer bowel movements in a week than normal, has difficulty having a bowel movement, or has stools that are dry, hard, or larger than normal. Constipation may be caused by an underlying condition. It may become worse with age if a person takes certain medicines and does not take in enough fluids. Follow these instructions at home: Eating and drinking   Eat foods that have a lot of fiber, such as fresh fruits and vegetables, whole grains, and beans.  Limit foods that are high in fat, low in fiber, or overly processed, such as french fries, hamburgers, cookies, candies, and soda.  Drink enough fluid to keep your urine clear or pale yellow. General instructions  Exercise regularly or as told by your health care provider.  Go to the restroom when you have the urge to go. Do not hold it in.  Take over-the-counter and prescription medicines only as told by your health care provider. These include any fiber supplements.  Practice pelvic floor retraining exercises, such as deep breathing while relaxing the lower abdomen and pelvic floor relaxation during bowel movements.  Watch your condition for any changes.  Keep all follow-up visits as told by your health care provider. This is important. Contact a health care provider if:  You have pain that gets worse.  You have a fever.  You do not have a bowel movement after 4 days.  You vomit.  You are not hungry.  You lose weight.  You are bleeding from the anus.  You have thin, pencil-like stools. Get help right away if:  You have a fever and your symptoms suddenly get worse.  You leak stool or have blood in your stool.  Your abdomen is bloated.  You have severe pain in your abdomen.  You  feel dizzy or you faint. This information is not intended to replace advice given to you by your health care provider. Make sure you discuss any questions you have with your health care provider. Document Released: 02/18/2004 Document Revised: 12/10/2015 Document Reviewed: 11/10/2015 Elsevier Interactive Patient Education  2018 [employer].

## 2017-06-21 NOTE — Telephone Encounter (Signed)
Please advise 

## 2017-06-21 NOTE — Progress Notes (Signed)
Chief Complaint  Patient presents with  . Constipation   Follow up  1. C/o chronic constipation since GB removed 02/2011 she tried fiber gummies, Digestive advantage probiotics but stopped taking and also takes Colace 2 pills of 100 mg daily but stool is still hard. She will only go 1-2 x per week.She admits not drinking enough water or eating enough fruit . She c/o upper ab fullness x 1 month which may be related to constipation. She has appt with Dr. Rayann Heman at [facility]. She has had 3 colonoscopies for mom with hx of precancerous polyps. She is passing gas 2. C/o cough and nasal congestion and wants Tussionex refill. She has post nasal drip since sinus surgery and does zyrtec qhs nasal sprays have not helped her and OTC mucinex does not help.  Cough worse x 2 days and phelgm in am   Review of Systems  Constitutional: Negative for weight loss.  HENT: Positive for congestion.        +post nasal drip   Respiratory: Negative for shortness of breath.   Cardiovascular: Negative for chest pain.  Gastrointestinal: Positive for abdominal pain and constipation.  Musculoskeletal: Negative for falls.  Skin: Negative for rash.  Psychiatric/Behavioral: Negative for memory loss.   Past Medical History:  Diagnosis Date  . Cancer [facility])    as a child  . Chronic constipation   . Hypertension    Past Surgical History:  Procedure Laterality Date  . CESAREAN SECTION  1992  . CHOLECYSTECTOMY  Dec 2012   [facility]  . COLONOSCOPY    . [place]  . LYMPH NODE BIOPSY    . NASAL SINUS SURGERY     Family History  Problem Relation Age of Onset  . Arrhythmia Mother   . Hypertension Mother   . Colon polyps Mother        s/p colectomy   . Kidney disease Mother        CKD Stage 4  . Uterine cancer Mother   . Hypertension Father   . Diabetes Father   . Hypertension Brother   . Breast cancer Neg Hx    Social History   Socioeconomic History  . Marital status: Married     Spouse name: [name]  . Number of children: 1  . Years of education: 52  . Highest education level: Not on file  Social Needs  . Financial resource strain: Not on file  . Food insecurity - worry: Not on file  . Food insecurity - inability: Not on file  . Transportation needs - medical: Not on file  . Transportation needs - non-medical: Not on file  Occupational History  . 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  Tobacco Use  . Smoking status: Never Smoker  . Smokeless tobacco: Never Used  Substance and Sexual Activity  . Alcohol use: No    Alcohol/week: 0.0 oz  . Drug use: No  . Sexual activity: Yes    Birth control/protection: Post-menopausal  Other Topics Concern  . Not on file  Social History Narrative  . Not on file   Current Meds  Medication Sig  . ALPRAZolam (XANAX) 0.5 MG tablet TAKE 2 TABLETS BY MOUTH DAILY AS NEEDED FOR ANXIETY  . amitriptyline (ELAVIL) 100 MG tablet TAKE ONE TABLET BY MOUTH AT BEDTIME  . cetirizine (ZYRTEC) 10 MG tablet Take 10 mg by mouth daily.    . cholecalciferol (VITAMIN D) 1000 units tablet Take 1,000 Units  by mouth daily.  . cyclobenzaprine (FLEXERIL) 10 MG tablet TAKE ONE TABLET 3 TIMES DAILY AS NEEDED FOR MUSCLE SPASMS  . docusate sodium (COLACE) 100 MG capsule Take 100 mg by mouth 2 (two) times daily.  . Lactobacillus (PROBIOTIC ACIDOPHILUS PO) Take 1 tablet by mouth daily.  Marland Kitchen losartan (COZAAR) 100 MG tablet TAKE ONE TABLET EVERY DAY  . meloxicam (MOBIC) 15 MG tablet TAKE 1 TABLET BY MOUTH DAILY  . omeprazole (PRILOSEC) 40 MG capsule TAKE 1 CAPSULE EVERY DAY  . spironolactone (ALDACTONE) 25 MG tablet TAKE ONE TABLET BY MOUTH EVERY DAY  . zolpidem (AMBIEN CR) 12.5 MG CR tablet TAKE ONE TABLET EVERY EVENING AT BEDTIMEAS NEEDED FOR SLEEP   No Known Allergies No results found for this or any previous visit (from the past 2160 hour(s)). Objective  Body mass index is 27.28 kg/m. Wt Readings from Last 3 Encounters:   06/21/17 190 lb 2 oz (86.2 kg)  04/19/17 191 lb 12.8 oz (87 kg)  01/25/17 186 lb 3.2 oz (84.5 kg)   Temp Readings from Last 3 Encounters:  06/21/17 99 F (37.2 C) (Oral)  04/19/17 98.5 F (36.9 C) (Oral)  01/25/17 98.5 F (36.9 C) (Oral)   BP Readings from Last 3 Encounters:  06/21/17 124/88  04/19/17 104/66  01/25/17 106/84   Pulse Readings from Last 3 Encounters:  06/21/17 96  04/19/17 87  01/25/17 95   O2 sat room air 97% Physical Exam  Constitutional: She is oriented to person, place, and time and well-developed, well-nourished, and in no distress. Vital signs are normal.  HENT:  Head: Normocephalic and atraumatic.  Mouth/Throat: Oropharynx is clear and moist and mucous membranes are normal.  Eyes: Conjunctivae are normal. Pupils are equal, round, and reactive to light.  Cardiovascular: Normal rate, regular rhythm and normal heart sounds.  Pulmonary/Chest: Effort normal and breath sounds normal.  Abdominal: Soft. Bowel sounds are normal. There is no tenderness. There is no rebound.  Neurological: She is alert and oriented to person, place, and time. Gait normal.  Skin: Skin is warm, dry and intact.  Psychiatric: Mood, memory, affect and judgment normal.  Nursing note and vitals reviewed.   Assessment   1. Chronic constipation ? Idiopathic vs IBS 2. Cough and nasal congestion, post nasal drip s/p sinus surgery  Plan  1. Try Linzess 145 qam before breakfast  Call back if too much will reduce dose or if not enough go up to 290 dose Try Align probiotics  Cont Colace  Try Miralax prn  2. Add singulair, qhs Zyrtec refilled Tussionex  Provider: Dr. Olivia Mackie McLean-Scocuzza-Internal Medicine

## 2017-08-06 ENCOUNTER — Other Ambulatory Visit: Payer: Self-pay | Admitting: Internal Medicine

## 2017-08-06 ENCOUNTER — Telehealth: Payer: Self-pay | Admitting: Internal Medicine

## 2017-08-06 MED ORDER — ALPRAZOLAM 0.5 MG PO TABS: ORAL_TABLET | ORAL | 5 refills | Status: DC

## 2017-08-06 MED ORDER — ZOLPIDEM TARTRATE ER 12.5 MG PO TBCR: EXTENDED_RELEASE_TABLET | ORAL | 2 refills | Status: DC

## 2017-08-06 NOTE — Telephone Encounter (Signed)
Refilled: 04/19/2017 Last OV: 06/21/2017 Next OV: 10/18/2017

## 2017-08-06 NOTE — Telephone Encounter (Signed)
ALPRAZOLAM AND AMBIEN REFILLS SIGNED AND IN THE  BLUE FOLDER . PLEASE NOTE SHE HAS A new pharmacy [facility],  Not in chart

## 2017-08-06 NOTE — Telephone Encounter (Signed)
[facility] has been added to the pt's chart and medications have been faxed.

## 2017-08-06 NOTE — Telephone Encounter (Signed)
Pt needs a refill on zolpidem (AMBIEN CR) 12.5 MG CR tablet sent to [facility]  ALL RX NEED TO GO TO [facility] FROM NOW ON

## 2017-08-20 ENCOUNTER — Telehealth: Payer: Self-pay | Admitting: Internal Medicine

## 2017-08-20 NOTE — Telephone Encounter (Signed)
Copied from [place]. Topic: Quick Communication - Rx Refill/Question >> Aug 20, 2017  5:15 PM Arletha Grippe wrote: Medication:  amitriptyline (ELAVIL) 100 MG tablet     Has the patient contacted their pharmacy? No. New pharm   (Agent: If no, request that the patient contact the pharmacy for the refill.)   Preferred Pharmacy (with phone number or street name): [facility]: Please be advised that RX refills may take up to 3 business days. We ask that you follow-up with your pharmacy.

## 2017-08-21 MED ORDER — AMITRIPTYLINE HCL 100 MG PO TABS: 100.0000 mg | ORAL_TABLET | Freq: Every day | ORAL | 1 refills | Status: DC

## 2017-08-21 NOTE — Telephone Encounter (Signed)
Pt called regarding changing pharmacies. She has refills at [facility] for elavil. No answer, message left for patient to return call.

## 2017-08-21 NOTE — Telephone Encounter (Signed)
rx has been re-sent to [facility].

## 2017-08-21 NOTE — Telephone Encounter (Signed)
Pt. Called and said there is refills at [facility] she does not use [facility] anymore.. Ins. Does not cover [facility].   She is asking if can send to [facility] to be filled   amitriptyline (ELAVIL) 100 MG tablet    [place], [address]  [place] [address]  Phone: [phone] Fax: [phone]

## 2017-08-21 NOTE — Telephone Encounter (Signed)
New pharmacy

## 2017-10-18 ENCOUNTER — Encounter: Payer: Self-pay | Admitting: Internal Medicine

## 2017-10-18 ENCOUNTER — Ambulatory Visit: Payer: 59 | Admitting: Internal Medicine

## 2017-10-18 VITALS — BP 110/78 | HR 87 | Temp 98.4°F | Resp 14 | Ht 70.0 in | Wt 191.0 lb

## 2017-10-18 DIAGNOSIS — I10 Essential (primary) hypertension: Secondary | ICD-10-CM

## 2017-10-18 DIAGNOSIS — F419 Anxiety disorder, unspecified: Secondary | ICD-10-CM | POA: Diagnosis not present

## 2017-10-18 DIAGNOSIS — E785 Hyperlipidemia, unspecified: Secondary | ICD-10-CM

## 2017-10-18 DIAGNOSIS — R69 Illness, unspecified: Secondary | ICD-10-CM | POA: Diagnosis not present

## 2017-10-18 DIAGNOSIS — Z79899 Other long term (current) drug therapy: Secondary | ICD-10-CM

## 2017-10-18 DIAGNOSIS — R7303 Prediabetes: Secondary | ICD-10-CM

## 2017-10-18 DIAGNOSIS — E559 Vitamin D deficiency, unspecified: Secondary | ICD-10-CM

## 2017-10-18 DIAGNOSIS — F5105 Insomnia due to other mental disorder: Secondary | ICD-10-CM

## 2017-10-18 LAB — COMPREHENSIVE METABOLIC PANEL
ALK PHOS: 79 U/L (ref 39–117)
ALT: 11 U/L (ref 0–35)
AST: 12 U/L (ref 0–37)
Albumin: 4.4 g/dL (ref 3.5–5.2)
BILIRUBIN TOTAL: 0.6 mg/dL (ref 0.2–1.2)
BUN: 10 mg/dL (ref 6–23)
CO2: 28 mEq/L (ref 19–32)
Calcium: 9.7 mg/dL (ref 8.4–10.5)
Chloride: 103 mEq/L (ref 96–112)
Creatinine, Ser: 0.91 mg/dL (ref 0.40–1.20)
GFR: 68.49 mL/min (ref >60.00)
GLUCOSE: 100 mg/dL — AB (ref 70–99)
POTASSIUM: 4 meq/L (ref 3.5–5.1)
SODIUM: 139 meq/L (ref 135–145)
TOTAL PROTEIN: 7.5 g/dL (ref 6.0–8.3)

## 2017-10-18 LAB — MICROALBUMIN / CREATININE URINE RATIO
Creatinine,U: 141 mg/dL
Microalb Creat Ratio: 0.6 mg/g (ref 0.0–30.0)
Microalb, Ur: 0.8 mg/dL (ref 0.0–1.9)

## 2017-10-18 LAB — VITAMIN D 25 HYDROXY (VIT D DEFICIENCY, FRACTURES): VITD: 31.62 ng/mL (ref 30.00–100.00)

## 2017-10-18 LAB — LIPID PANEL
CHOL/HDL RATIO: 5
Cholesterol: 211 mg/dL — ABNORMAL HIGH (ref 0–200)
HDL: 42.9 mg/dL (ref >39.00)
LDL CALC: 140 mg/dL — AB (ref 0–99)
NONHDL: 167.95
TRIGLYCERIDES: 140 mg/dL (ref 0.0–149.0)
VLDL: 28 mg/dL (ref 0.0–40.0)

## 2017-10-18 LAB — HEMOGLOBIN A1C: Hgb A1c MFr Bld: 5.9 % (ref 4.6–6.5)

## 2017-10-18 LAB — VITAMIN B12: Vitamin B-12: 1318 pg/mL — ABNORMAL HIGH (ref 211–911)

## 2017-10-18 MED ORDER — DOXYCYCLINE HYCLATE 100 MG PO TABS: 100.0000 mg | ORAL_TABLET | Freq: Two times a day (BID) | ORAL | 0 refills | Status: DC

## 2017-10-18 NOTE — Progress Notes (Signed)
Subjective:  Patient ID: Dawn Griffith, female    DOB: Jul 10, 1965  Age: 52 y.o. MRN: [id number]  CC: The primary encounter diagnosis was Vitamin D deficiency. Diagnoses of Hyperlipidemia LDL goal <130, Prediabetes, Essential hypertension, benign, Long-term use of high-risk medication, and Insomnia secondary to anxiety were also pertinent to this visit.  HPI Dawn Griffith presents for follow up on GAD, type 2 DM and GERD  Imsomnia:  Using  ambien cr   , wakes up with clenched hands. Using alprazolam one to 2 times daily  Getting massages .  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   Had a severe headache tuesday with vomiting ,  First one in years , resolved spontaneouslyu   Outpatient Medications Prior to Visit  Medication Sig Dispense Refill  . ALPRAZolam (XANAX) 0.5 MG tablet TAKE 2 TABLETS BY MOUTH DAILY AS NEEDED FOR ANXIETY 60 tablet 5  . amitriptyline (ELAVIL) 100 MG tablet Take 1 tablet (100 mg total) by mouth at bedtime. 90 tablet 1  . cetirizine (ZYRTEC) 10 MG tablet Take 10 mg by mouth daily.      . cholecalciferol (VITAMIN D) 1000 units tablet Take 1,000 Units by mouth daily.    Marland Kitchen docusate sodium (COLACE) 100 MG capsule Take 100 mg by mouth 2 (two) times daily.    . Lactobacillus (PROBIOTIC ACIDOPHILUS PO) Take 1 tablet by mouth daily.    Marland Kitchen linaclotide (LINZESS) 145 MCG CAPS capsule Take 1 capsule (145 mcg total) by mouth daily before breakfast. 90 capsule 1  . losartan (COZAAR) 100 MG tablet TAKE ONE TABLET EVERY DAY 90 tablet 2  . omeprazole (PRILOSEC) 40 MG capsule TAKE 1 CAPSULE EVERY DAY 30 capsule 5  . spironolactone (ALDACTONE) 25 MG tablet TAKE ONE TABLET BY MOUTH EVERY DAY 90 tablet 2  . zolpidem (AMBIEN CR) 12.5 MG CR tablet TAKE ONE TABLET EVERY EVENING AT BEDTIMEAS NEEDED FOR SLEEP 30 tablet 2  . chlorpheniramine-HYDROcodone (TUSSIONEX  PENNKINETIC ER) 10-8 MG/5ML SUER Take 5 mLs by mouth at bedtime as needed for cough. (Patient not taking: Reported on 10/18/2017) 473 mL 0  . cyclobenzaprine (FLEXERIL) 10 MG tablet TAKE ONE TABLET 3 TIMES DAILY AS NEEDED FOR MUSCLE SPASMS (Patient not taking: Reported on 10/18/2017) 90 tablet 3  . meloxicam (MOBIC) 15 MG tablet TAKE 1 TABLET BY MOUTH DAILY (Patient not taking: Reported on 10/18/2017) 90 tablet 1  . montelukast (SINGULAIR) 10 MG tablet Take 1 tablet (10 mg total) by mouth at bedtime. (Patient not taking: Reported on 10/18/2017) 90 tablet 1   No facility-administered medications prior to visit.     Review of Systems;  Patient denies headache, fevers, malaise, unintentional weight loss, skin rash, eye pain, sinus congestion and sinus pain, sore throat, dysphagia,  hemoptysis , cough, dyspnea, wheezing, chest pain, palpitations, orthopnea, edema, abdominal pain, nausea, melena, diarrhea, constipation, flank pain, dysuria, hematuria, urinary  Frequency, nocturia, numbness, tingling, seizures,  Focal weakness, Loss of consciousness,  Tremor, insomnia, depression, anxiety, and suicidal ideation.      Objective:  BP 110/78 (BP Location: Left Arm, Patient Position: Sitting, Cuff Size: Normal)   Pulse 87   Temp 98.4 F (36.9 C) (Oral)   Resp 14   Ht 5\' 10"  (1.778 m)   Wt 191 lb (86.6 kg)   LMP 05/01/2014 (Approximate)   SpO2 95%   BMI 27.41  kg/m   BP Readings from Last 3 Encounters:  10/18/17 110/78  06/21/17 124/88  04/19/17 104/66    Wt Readings from Last 3 Encounters:  10/18/17 191 lb (86.6 kg)  06/21/17 190 lb 2 oz (86.2 kg)  04/19/17 191 lb 12.8 oz (87 kg)    General appearance: alert, cooperative and appears stated age Ears: normal TM's and external ear canals both ears Throat: lips, mucosa, and tongue normal; teeth and gums normal Neck: no adenopathy, no carotid bruit, supple, symmetrical, trachea midline and thyroid not enlarged, symmetric, no  tenderness/mass/nodules Back: symmetric, no curvature. ROM normal. No CVA tenderness. Lungs: clear to auscultation bilaterally Heart: regular rate and rhythm, S1, S2 normal, no murmur, click, rub or gallop Abdomen: soft, non-tender; bowel sounds normal; no masses,  no organomegaly Pulses: 2+ and symmetric Skin: Skin color, texture, turgor normal. No rashes or lesions Lymph nodes: Cervical, supraclavicular, and axillary nodes normal.  Lab Results  Component Value Date   HGBA1C 5.9 10/18/2017   HGBA1C 5.9 01/22/2017   HGBA1C 5.6 07/03/2016    Lab Results  Component Value Date   CREATININE 0.91 10/18/2017   CREATININE 0.84 01/22/2017   CREATININE 0.85 07/03/2016    Lab Results  Component Value Date   WBC 5.6 01/22/2017   HGB 13.8 01/22/2017   HCT 41.9 01/22/2017   PLT 311.0 01/22/2017   GLUCOSE 100 (H) 10/18/2017   CHOL 211 (H) 10/18/2017   TRIG 140.0 10/18/2017   HDL 42.90 10/18/2017   LDLDIRECT 155.0 01/22/2017   LDLCALC 140 (H) 10/18/2017   ALT 11 10/18/2017   AST 12 10/18/2017   NA 139 10/18/2017   K 4.0 10/18/2017   CL 103 10/18/2017   CREATININE 0.91 10/18/2017   BUN 10 10/18/2017   CO2 28 10/18/2017   TSH 1.14 01/22/2017   HGBA1C 5.9 10/18/2017   MICROALBUR 0.8 10/18/2017    No results found.  Assessment & Plan:   Problem List Items Addressed This Visit    Vitamin D deficiency - Primary   Relevant Orders   VITAMIN D 25 Hydroxy (Vit-D Deficiency, Fractures) (Completed)   Hyperlipidemia LDL goal <130   Relevant Orders   Lipid panel (Completed)   Prediabetes    Her random glucose was elevated but not diagnostic of diabetes .  I recommend she follow a low glycemic index diet and particpate regularly in an aerobic  exercise activity.  We should check an A1c in 6 months.   Lab Results  Component Value Date   HGBA1C 5.9 10/18/2017         Relevant Orders   Hemoglobin A1c (Completed)   Comprehensive metabolic panel (Completed)   Insomnia secondary  to anxiety    Managed with elavil and ambien.  No changes today.  encouraged to add daily exercise       Essential hypertension, benign    Well controlled on current regimen. Renal function stable, no changes today.  Lab Results  Component Value Date   CREATININE 0.91 10/18/2017   Lab Results  Component Value Date   NA 139 10/18/2017   K 4.0 10/18/2017   CL 103 10/18/2017   CO2 28 10/18/2017         Relevant Orders   Microalbumin / creatinine urine ratio (Completed)    Other Visit Diagnoses    Long-term use of high-risk medication       Relevant Orders   Vitamin B12 (Completed)     A total of 25 minutes of face to  face time was spent with patient more than half of which was spent in counselling about the above mentioned conditions  and coordination of care   I am having Tinisha C. Latner start on doxycycline. I am also having her maintain her cetirizine, Lactobacillus (PROBIOTIC ACIDOPHILUS PO), cyclobenzaprine, meloxicam, docusate sodium, cholecalciferol, losartan, spironolactone, omeprazole, linaclotide, montelukast, chlorpheniramine-HYDROcodone, ALPRAZolam, zolpidem, and amitriptyline.  Meds ordered this encounter  Medications  . doxycycline (VIBRA-TABS) 100 MG tablet    Sig: Take 1 tablet (100 mg total) by mouth 2 (two) times daily.    Dispense:  20 tablet    Refill:  0    There are no discontinued medications.  Follow-up: No follow-ups on file.   Crecencio Mc, MD

## 2017-10-18 NOTE — Patient Instructions (Signed)

## 2017-10-20 NOTE — Assessment & Plan Note (Signed)
Well controlled on current regimen. Renal function stable, no changes today.  Lab Results  Component Value Date   CREATININE 0.91 10/18/2017   Lab Results  Component Value Date   NA 139 10/18/2017   K 4.0 10/18/2017   CL 103 10/18/2017   CO2 28 10/18/2017

## 2017-10-20 NOTE — Assessment & Plan Note (Signed)
Her random glucose was elevated but not diagnostic of diabetes .  I recommend she follow a low glycemic index diet and particpate regularly in an aerobic  exercise activity.  We should check an A1c in 6 months.   Lab Results  Component Value Date   HGBA1C 5.9 10/18/2017

## 2017-10-20 NOTE — Assessment & Plan Note (Signed)
Managed with elavil and ambien.  No changes today.  encouraged to add daily exercise  

## 2017-11-04 DIAGNOSIS — L02234 Carbuncle of groin: Secondary | ICD-10-CM | POA: Diagnosis not present

## 2017-11-11 ENCOUNTER — Other Ambulatory Visit: Payer: Self-pay | Admitting: Internal Medicine

## 2017-11-12 NOTE — Telephone Encounter (Signed)
Printed, signed and faxed.  

## 2017-11-12 NOTE — Telephone Encounter (Signed)
Refilled: 08/06/2017 Last OV: 10/18/2017 Next OV: 01/28/2018

## 2017-12-18 ENCOUNTER — Other Ambulatory Visit: Payer: Self-pay

## 2017-12-18 DIAGNOSIS — K5904 Chronic idiopathic constipation: Secondary | ICD-10-CM

## 2017-12-18 MED ORDER — LINACLOTIDE 145 MCG PO CAPS: 145.0000 ug | ORAL_CAPSULE | Freq: Every day | ORAL | 1 refills | Status: DC

## 2018-01-28 ENCOUNTER — Encounter: Payer: Self-pay | Admitting: Internal Medicine

## 2018-01-28 ENCOUNTER — Ambulatory Visit (INDEPENDENT_AMBULATORY_CARE_PROVIDER_SITE_OTHER): Payer: 59 | Admitting: Internal Medicine

## 2018-01-28 VITALS — BP 114/76 | HR 93 | Temp 99.1°F | Resp 14 | Ht 70.0 in | Wt 185.0 lb

## 2018-01-28 DIAGNOSIS — I10 Essential (primary) hypertension: Secondary | ICD-10-CM | POA: Diagnosis not present

## 2018-01-28 DIAGNOSIS — Z23 Encounter for immunization: Secondary | ICD-10-CM

## 2018-01-28 DIAGNOSIS — Z0000 Encounter for general adult medical examination without abnormal findings: Secondary | ICD-10-CM

## 2018-01-28 DIAGNOSIS — K5904 Chronic idiopathic constipation: Secondary | ICD-10-CM

## 2018-01-28 DIAGNOSIS — R69 Illness, unspecified: Secondary | ICD-10-CM | POA: Diagnosis not present

## 2018-01-28 DIAGNOSIS — F419 Anxiety disorder, unspecified: Secondary | ICD-10-CM

## 2018-01-28 DIAGNOSIS — E049 Nontoxic goiter, unspecified: Secondary | ICD-10-CM | POA: Diagnosis not present

## 2018-01-28 DIAGNOSIS — R7303 Prediabetes: Secondary | ICD-10-CM | POA: Diagnosis not present

## 2018-01-28 DIAGNOSIS — Z1231 Encounter for screening mammogram for malignant neoplasm of breast: Secondary | ICD-10-CM

## 2018-01-28 DIAGNOSIS — Z1239 Encounter for other screening for malignant neoplasm of breast: Secondary | ICD-10-CM

## 2018-01-28 LAB — COMPREHENSIVE METABOLIC PANEL
ALBUMIN: 4.8 g/dL (ref 3.5–5.2)
ALK PHOS: 89 U/L (ref 39–117)
ALT: 18 U/L (ref 0–35)
AST: 18 U/L (ref 0–37)
BUN: 12 mg/dL (ref 6–23)
CALCIUM: 10.5 mg/dL (ref 8.4–10.5)
CO2: 30 mEq/L (ref 19–32)
Chloride: 102 mEq/L (ref 96–112)
Creatinine, Ser: 0.94 mg/dL (ref 0.40–1.20)
GFR: 65.91 mL/min (ref >60.00)
Glucose, Bld: 111 mg/dL — ABNORMAL HIGH (ref 70–99)
POTASSIUM: 4.4 meq/L (ref 3.5–5.1)
SODIUM: 139 meq/L (ref 135–145)
TOTAL PROTEIN: 8 g/dL (ref 6.0–8.3)
Total Bilirubin: 0.6 mg/dL (ref 0.2–1.2)

## 2018-01-28 LAB — TSH: TSH: 0.96 u[IU]/mL (ref 0.35–4.50)

## 2018-01-28 LAB — HEMOGLOBIN A1C: HEMOGLOBIN A1C: 6 % (ref 4.6–6.5)

## 2018-01-28 MED ORDER — ALPRAZOLAM 0.5 MG PO TABS: ORAL_TABLET | ORAL | 5 refills | Status: DC

## 2018-01-28 MED ORDER — LINACLOTIDE 290 MCG PO CAPS
290.0000 ug | ORAL_CAPSULE | Freq: Every day | ORAL | 5 refills | Status: DC
Start: 2018-01-28 — End: 2018-07-31

## 2018-01-28 NOTE — Patient Instructions (Signed)
Your mammogram has been ordered.  You can call to make your appointment at [place]  [phone].  You are due in August   I have increased the dose of Linzess to 290 mg daily   You received the TdaP vaccine  Today,  And you can get the flu vaccine next week    Health Maintenance for Postmenopausal Women Menopause is a normal process in which your reproductive ability comes to an end. This process happens gradually over a span of months to years, usually between the ages of 48 and 55. Menopause is complete when you have missed 12 consecutive menstrual periods. It is important to talk with your health care provider about some of the most common conditions that affect postmenopausal women, such as heart disease, cancer, and bone loss (osteoporosis). Adopting a healthy lifestyle and getting preventive care can help to promote your health and wellness. Those actions can also lower your chances of developing some of these common conditions. What should I know about menopause? During menopause, you may experience a number of symptoms, such as:  Moderate-to-severe hot flashes.  Night sweats.  Decrease in sex drive.  Mood swings.  Headaches.  Tiredness.  Irritability.  Memory problems.  Insomnia.  Choosing to treat or not to treat menopausal changes is an individual decision that you make with your health care provider. What should I know about hormone replacement therapy and supplements? Hormone therapy products are effective for treating symptoms that are associated with menopause, such as hot flashes and night sweats. Hormone replacement carries certain risks, especially as you become older. If you are thinking about using estrogen or estrogen with progestin treatments, discuss the benefits and risks with your health care provider. What should I know about heart disease and stroke? Heart disease, heart attack, and stroke become more likely as you age. This may be due, in part, to the  hormonal changes that your body experiences during menopause. These can affect how your body processes dietary fats, triglycerides, and cholesterol. Heart attack and stroke are both medical emergencies. There are many things that you can do to help prevent heart disease and stroke:  Have your blood pressure checked at least every 1-2 years. High blood pressure causes heart disease and increases the risk of stroke.  If you are 55-79 years old, ask your health care provider if you should take aspirin to prevent a heart attack or a stroke.  Do not use any tobacco products, including cigarettes, chewing tobacco, or electronic cigarettes. If you need help quitting, ask your health care provider.  It is important to eat a healthy diet and maintain a healthy weight. ? Be sure to include plenty of vegetables, fruits, low-fat dairy products, and lean protein. ? Avoid eating foods that are high in solid fats, added sugars, or salt (sodium).  Get regular exercise. This is one of the most important things that you can do for your health. ? Try to exercise for at least 150 minutes each week. The type of exercise that you do should increase your heart rate and make you sweat. This is known as moderate-intensity exercise. ? Try to do strengthening exercises at least twice each week. Do these in addition to the moderate-intensity exercise.  Know your numbers.Ask your health care provider to check your cholesterol and your blood glucose. Continue to have your blood tested as directed by your health care provider.  What should I know about cancer screening? There are several types of cancer. Take the   following steps to reduce your risk and to catch any cancer development as early as possible. Breast Cancer  Practice breast self-awareness. ? This means understanding how your breasts normally appear and feel. ? It also means doing regular breast self-exams. Let your health care provider know about any changes,  no matter how small.  If you are 40 or older, have a clinician do a breast exam (clinical breast exam or CBE) every year. Depending on your age, family history, and medical history, it may be recommended that you also have a yearly breast X-ray (mammogram).  If you have a family history of breast cancer, talk with your health care provider about genetic screening.  If you are at high risk for breast cancer, talk with your health care provider about having an MRI and a mammogram every year.  Breast cancer (BRCA) gene test is recommended for women who have family members with BRCA-related cancers. Results of the assessment will determine the need for genetic counseling and BRCA1 and for BRCA2 testing. BRCA-related cancers include these types: ? Breast. This occurs in males or females. ? Ovarian. ? Tubal. This may also be called fallopian tube cancer. ? Cancer of the abdominal or pelvic lining (peritoneal cancer). ? Prostate. ? Pancreatic.  Cervical, Uterine, and Ovarian Cancer Your health care provider may recommend that you be screened regularly for cancer of the pelvic organs. These include your ovaries, uterus, and vagina. This screening involves a pelvic exam, which includes checking for microscopic changes to the surface of your cervix (Pap test).  For women ages 21-65, health care providers may recommend a pelvic exam and a Pap test every three years. For women ages 30-65, they may recommend the Pap test and pelvic exam, combined with testing for human papilloma virus (HPV), every five years. Some types of HPV increase your risk of cervical cancer. Testing for HPV may also be done on women of any age who have unclear Pap test results.  Other health care providers may not recommend any screening for nonpregnant women who are considered low risk for pelvic cancer and have no symptoms. Ask your health care provider if a screening pelvic exam is right for you.  If you have had past treatment  for cervical cancer or a condition that could lead to cancer, you need Pap tests and screening for cancer for at least 20 years after your treatment. If Pap tests have been discontinued for you, your risk factors (such as having a new sexual partner) need to be reassessed to determine if you should start having screenings again. Some women have medical problems that increase the chance of getting cervical cancer. In these cases, your health care provider may recommend that you have screening and Pap tests more often.  If you have a family history of uterine cancer or ovarian cancer, talk with your health care provider about genetic screening.  If you have vaginal bleeding after reaching menopause, tell your health care provider.  There are currently no reliable tests available to screen for ovarian cancer.  Lung Cancer Lung cancer screening is recommended for adults 55-80 years old who are at high risk for lung cancer because of a history of smoking. A yearly low-dose CT scan of the lungs is recommended if you:  Currently smoke.  Have a history of at least 30 pack-years of smoking and you currently smoke or have quit within the past 15 years. A pack-year is smoking an average of one pack of cigarettes per day   for one year.  Yearly screening should:  Continue until it has been 15 years since you quit.  Stop if you develop a health problem that would prevent you from having lung cancer treatment.  Colorectal Cancer  This type of cancer can be detected and can often be prevented.  Routine colorectal cancer screening usually begins at age 50 and continues through age 75.  If you have risk factors for colon cancer, your health care provider may recommend that you be screened at an earlier age.  If you have a family history of colorectal cancer, talk with your health care provider about genetic screening.  Your health care provider may also recommend using home test kits to check for hidden  blood in your stool.  A small camera at the end of a tube can be used to examine your colon directly (sigmoidoscopy or colonoscopy). This is done to check for the earliest forms of colorectal cancer.  Direct examination of the colon should be repeated every 5-10 years until age 75. However, if early forms of precancerous polyps or small growths are found or if you have a family history or genetic risk for colorectal cancer, you may need to be screened more often.  Skin Cancer  Check your skin from head to toe regularly.  Monitor any moles. Be sure to tell your health care provider: ? About any new moles or changes in moles, especially if there is a change in a mole's shape or color. ? If you have a mole that is larger than the size of a pencil eraser.  If any of your family members has a history of skin cancer, especially at a young age, talk with your health care provider about genetic screening.  Always use sunscreen. Apply sunscreen liberally and repeatedly throughout the day.  Whenever you are outside, protect yourself by wearing long sleeves, pants, a wide-brimmed hat, and sunglasses.  What should I know about osteoporosis? Osteoporosis is a condition in which bone destruction happens more quickly than new bone creation. After menopause, you may be at an increased risk for osteoporosis. To help prevent osteoporosis or the bone fractures that can happen because of osteoporosis, the following is recommended:  If you are 19-50 years old, get at least 1,000 mg of calcium and at least 600 mg of vitamin D per day.  If you are older than age 50 but younger than age 70, get at least 1,200 mg of calcium and at least 600 mg of vitamin D per day.  If you are older than age 70, get at least 1,200 mg of calcium and at least 800 mg of vitamin D per day.  Smoking and excessive alcohol intake increase the risk of osteoporosis. Eat foods that are rich in calcium and vitamin D, and do weight-bearing  exercises several times each week as directed by your health care provider. What should I know about how menopause affects my mental health? Depression may occur at any age, but it is more common as you become older. Common symptoms of depression include:  Low or sad mood.  Changes in sleep patterns.  Changes in appetite or eating patterns.  Feeling an overall lack of motivation or enjoyment of activities that you previously enjoyed.  Frequent crying spells.  Talk with your health care provider if you think that you are experiencing depression. What should I know about immunizations? It is important that you get and maintain your immunizations. These include:  Tetanus, diphtheria, and pertussis (Tdap)   booster vaccine.  Influenza every year before the flu season begins.  Pneumonia vaccine.  Shingles vaccine.  Your health care provider may also recommend other immunizations. This information is not intended to replace advice given to you by your health care provider. Make sure you discuss any questions you have with your health care provider. Document Released: 07/14/2005 Document Revised: 12/10/2015 Document Reviewed: 02/23/2015 Elsevier Interactive Patient Education  2018 [employer].

## 2018-01-28 NOTE — Progress Notes (Signed)
Patient ID: Dawn Griffith, female    DOB: 11/18/1965  Age: 52 y.o. MRN: [id number]  The patient is here for annual preventive examination and management of other chronic and acute problems.   The risk factors are reflected in the social history.  The roster of all physicians providing medical care to patient - is listed in the Snapshot section of the chart.  Activities of daily living:  The patient is 100% independent in all ADLs: dressing, toileting, feeding as well as independent mobility  Home safety : The patient has smoke detectors in the home. They wear seatbelts.  There are no firearms at home. There is no violence in the home.   There is no risks for hepatitis, STDs or HIV. There is no   history of blood transfusion. They have no travel history to infectious disease endemic areas of the world.  The patient has seen their dentist in the last six month. They have seen their eye doctor in the last year. They admit to slight hearing difficulty with regard to whispered voices and some television programs.  They have deferred audiologic testing in the last year.  They do not  have excessive sun exposure. Discussed the need for sun protection: hats, long sleeves and use of sunscreen if there is significant sun exposure.   Diet: the importance of a healthy diet is discussed. They do have a healthy diet.  The benefits of regular aerobic exercise were discussed. She walks 4 times per week ,  20 minutes.   Depression screen: there are no signs or vegative symptoms of depression- irritability, change in appetite, anhedonia, sadness/tearfullness.  The following portions of the patient's history were reviewed and updated as appropriate: allergies, current medications, past family history, past medical history,  past surgical history, past social history  and problem list.  Visual acuity was not assessed per patient preference since she has regular follow up with her ophthalmologist. Hearing and body  mass index were assessed and reviewed.   During the course of the visit the patient was educated and counseled about appropriate screening and preventive services including : fall prevention , diabetes screening, nutrition counseling, colorectal cancer screening, and recommended immunizations.    CC: The primary encounter diagnosis was Prediabetes. Diagnoses of Chronic idiopathic constipation, Goiter, Breast cancer screening, Anxiety, Need for tetanus, diphtheria, and acellular pertussis (Tdap) vaccine, Encounter for preventive health examination, and Essential hypertension, benign were also pertinent to this visit.  Constipation, chronic:  Using Linzess 145 mcg daily .  Having BMs every 2 to 3 days despite taking colace as well .  History Erianna has a past medical history of Cancer ([place]), Chronic constipation, and Hypertension.   She has a past surgical history that includes Cesarean section (1992); Ectopic pregnancy surgery (1997); Lymph node biopsy; Nasal sinus surgery; Colonoscopy; and Cholecystectomy (Dec 2012).   Her family history includes Arrhythmia in her mother; Colon polyps in her mother; Diabetes in her father; Hypertension in her brother, father, and mother; Kidney disease in her mother; Uterine cancer in her mother.She reports that she has never smoked. She has never used smokeless tobacco. She reports that she does not drink alcohol or use drugs.  Outpatient Medications Prior to Visit  Medication Sig Dispense Refill  . amitriptyline (ELAVIL) 100 MG tablet Take 1 tablet (100 mg total) by mouth at bedtime. 90 tablet 1  . cetirizine (ZYRTEC) 10 MG tablet Take 10 mg by mouth daily.      . cholecalciferol (VITAMIN D)  1000 units tablet Take 1,000 Units by mouth daily.    Marland Kitchen docusate sodium (COLACE) 100 MG capsule Take 100 mg by mouth 2 (two) times daily.    Marland Kitchen losartan (COZAAR) 100 MG tablet TAKE ONE TABLET EVERY DAY 90 tablet 2  . omeprazole (PRILOSEC) 40 MG capsule TAKE 1 CAPSULE EVERY  DAY 30 capsule 5  . spironolactone (ALDACTONE) 25 MG tablet TAKE ONE TABLET BY MOUTH EVERY DAY 90 tablet 2  . zolpidem (AMBIEN CR) 12.5 MG CR tablet TAKE ONE TABLET BY MOUTH EVERY NIGHT AT BEDTIME AS NEEDED FOR SLEEP 30 tablet 5  . ALPRAZolam (XANAX) 0.5 MG tablet TAKE 2 TABLETS BY MOUTH DAILY AS NEEDED FOR ANXIETY 60 tablet 5  . linaclotide (LINZESS) 145 MCG CAPS capsule Take 1 capsule (145 mcg total) by mouth daily before breakfast. 90 capsule 1  . cyclobenzaprine (FLEXERIL) 10 MG tablet TAKE ONE TABLET 3 TIMES DAILY AS NEEDED FOR MUSCLE SPASMS (Patient not taking: Reported on 10/18/2017) 90 tablet 3  . Lactobacillus (PROBIOTIC ACIDOPHILUS PO) Take 1 tablet by mouth daily.    . meloxicam (MOBIC) 15 MG tablet TAKE 1 TABLET BY MOUTH DAILY (Patient not taking: Reported on 10/18/2017) 90 tablet 1  . montelukast (SINGULAIR) 10 MG tablet Take 1 tablet (10 mg total) by mouth at bedtime. (Patient not taking: Reported on 10/18/2017) 90 tablet 1  . chlorpheniramine-HYDROcodone (TUSSIONEX PENNKINETIC ER) 10-8 MG/5ML SUER Take 5 mLs by mouth at bedtime as needed for cough. (Patient not taking: Reported on 10/18/2017) 473 mL 0  . doxycycline (VIBRA-TABS) 100 MG tablet Take 1 tablet (100 mg total) by mouth 2 (two) times daily. (Patient not taking: Reported on 01/28/2018) 20 tablet 0   No facility-administered medications prior to visit.     Review of Systems   Patient denies headache, fevers, malaise, unintentional weight loss, skin rash, eye pain, sinus congestion and sinus pain, sore throat, dysphagia,  hemoptysis , cough, dyspnea, wheezing, chest pain, palpitations, orthopnea, edema, abdominal pain, nausea, melena, diarrhea, constipation, flank pain, dysuria, hematuria, urinary  Frequency, nocturia, numbness, tingling, seizures,  Focal weakness, Loss of consciousness,  Tremor, insomnia, depression, anxiety, and suicidal ideation.      Objective:  BP 114/76 (BP Location: Left Arm, Patient Position: Sitting,  Cuff Size: Normal)   Pulse 93   Temp 99.1 F (37.3 C) (Oral)   Resp 14   Ht 5\' 10"  (1.778 m)   Wt 185 lb (83.9 kg)   LMP 05/01/2014 (Approximate)   SpO2 96%   BMI 26.54 kg/m   Physical Exam   General appearance: alert, cooperative and appears stated age Head: Normocephalic, without obvious abnormality, atraumatic Eyes: conjunctivae/corneas clear. PERRL, EOM's intact. Fundi benign. Ears: normal TM's and external ear canals both ears Nose: Nares normal. Septum midline. Mucosa normal. No drainage or sinus tenderness. Throat: lips, mucosa, and tongue normal; teeth and gums normal Neck: no adenopathy, no carotid bruit, no JVD, supple, symmetrical, trachea midline and thyroid not enlarged, symmetric, no tenderness/mass/nodules Lungs: clear to auscultation bilaterally Breasts: normal appearance, no masses or tenderness Heart: regular rate and rhythm, S1, S2 normal, no murmur, click, rub or gallop Abdomen: soft, non-tender; bowel sounds normal; no masses,  no organomegaly Extremities: extremities normal, atraumatic, no cyanosis or edema Pulses: 2+ and symmetric Skin: Skin color, texture, turgor normal. No rashes or lesions Neurologic: Alert and oriented X 3, normal strength and tone. Normal symmetric reflexes. Normal coordination and gait.      Assessment & Plan:   Problem List  Items Addressed This Visit    Anxiety    Managed with prn alprazolam,  And ambien/elavil for insomnia secondary to anxiety.  The risks and benefits of benzodiazepine use were reviewed with patient today including excessive sedation leading to respiratory depression,  impaired thinking/driving, and addiction.  Patient was advised to avoid concurrent use with alcohol, to use medication only as needed and not to share with others        Relevant Medications   ALPRAZolam (XANAX) 0.5 MG tablet   Constipation    increasing Linzess dose to 290 mcg daily       Relevant Medications   linaclotide (LINZESS) 290 MCG  CAPS capsule   Other Relevant Orders   Comprehensive metabolic panel (Completed)   Encounter for preventive health examination    Annual comprehensive preventive exam was done as well as an evaluation and management of chronic conditions .  During the course of the visit the patient was educated and counseled about appropriate screening and preventive services including :  diabetes screening, lipid analysis with projected  10 year  risk for CAD , nutrition counseling, breast, cervical and colorectal cancer screening, and recommended immunizations.  Printed recommendations for health maintenance screenings was given      Essential hypertension, benign    Well controlled on current regimen. Renal function stable, no changes today.  Lab Results  Component Value Date   CREATININE 0.94 01/28/2018   Lab Results  Component Value Date   NA 139 01/28/2018   K 4.4 01/28/2018   CL 102 01/28/2018   CO2 30 01/28/2018         Prediabetes - Primary   Relevant Orders   Hemoglobin A1c (Completed)    Other Visit Diagnoses    Goiter       Relevant Orders   TSH (Completed)   US Soft Tissue Head/Neck   Breast cancer screening       Relevant Orders   MM 3D SCREEN BREAST BILATERAL   Need for tetanus, diphtheria, and acellular pertussis (Tdap) vaccine       Relevant Orders   Tdap vaccine greater than or equal to 7yo IM (Completed)      I have discontinued Nitika C. Schadler's chlorpheniramine-HYDROcodone and doxycycline. I have also changed her linaclotide. Additionally, I am having her maintain her cetirizine, Lactobacillus (PROBIOTIC ACIDOPHILUS PO), cyclobenzaprine, meloxicam, docusate sodium, cholecalciferol, losartan, spironolactone, omeprazole, montelukast, amitriptyline, zolpidem, and ALPRAZolam.  Meds ordered this encounter  Medications  . linaclotide (LINZESS) 290 MCG CAPS capsule    Sig: Take 1 capsule (290 mcg total) by mouth daily before breakfast.    Dispense:  30 capsule    Refill:   5    Generic ok  . ALPRAZolam (XANAX) 0.5 MG tablet    Sig: TAKE 2 TABLETS BY MOUTH DAILY AS NEEDED FOR ANXIETY    Dispense:  60 tablet    Refill:  5    Medications Discontinued During This Encounter  Medication Reason  . chlorpheniramine-HYDROcodone (TUSSIONEX PENNKINETIC ER) 10-8 MG/5ML SUER Patient has not taken in last 30 days  . doxycycline (VIBRA-TABS) 100 MG tablet Completed Course  . linaclotide (LINZESS) 145 MCG CAPS capsule   . ALPRAZolam (XANAX) 0.5 MG tablet Reorder    Follow-up: Return in about 6 months (around 07/31/2018).   Crecencio Mc, MD

## 2018-01-29 NOTE — Assessment & Plan Note (Signed)
increasing Linzess dose to 290 mcg daily

## 2018-01-29 NOTE — Assessment & Plan Note (Signed)
Well controlled on current regimen. Renal function stable, no changes today.  Lab Results  Component Value Date   CREATININE 0.94 01/28/2018   Lab Results  Component Value Date   NA 139 01/28/2018   K 4.4 01/28/2018   CL 102 01/28/2018   CO2 30 01/28/2018

## 2018-01-29 NOTE — Assessment & Plan Note (Signed)
Annual comprehensive preventive exam was done as well as an evaluation and management of chronic conditions .  During the course of the visit the patient was educated and counseled about appropriate screening and preventive services including :  diabetes screening, lipid analysis with projected  10 year  risk for CAD , nutrition counseling, breast, cervical and colorectal cancer screening, and recommended immunizations.  Printed recommendations for health maintenance screenings was given 

## 2018-01-29 NOTE — Assessment & Plan Note (Signed)
Managed with prn alprazolam,  And ambien/elavil for insomnia secondary to anxiety.  The risks and benefits of benzodiazepine use were reviewed with patient today including excessive sedation leading to respiratory depression,  impaired thinking/driving, and addiction.  Patient was advised to avoid concurrent use with alcohol, to use medication only as needed and not to share with others   

## 2018-01-31 ENCOUNTER — Ambulatory Visit
Admission: RE | Admit: 2018-01-31 | Discharge: 2018-01-31 | Disposition: A | Discharge status: Home / Self Care | Payer: 59 | Source: Ambulatory Visit | Attending: Internal Medicine | Admitting: Internal Medicine

## 2018-01-31 DIAGNOSIS — E049 Nontoxic goiter, unspecified: Secondary | ICD-10-CM | POA: Diagnosis not present

## 2018-02-06 ENCOUNTER — Ambulatory Visit (INDEPENDENT_AMBULATORY_CARE_PROVIDER_SITE_OTHER): Payer: 59 | Admitting: *Deleted

## 2018-02-06 DIAGNOSIS — Z23 Encounter for immunization: Secondary | ICD-10-CM | POA: Diagnosis not present

## 2018-02-07 ENCOUNTER — Other Ambulatory Visit: Payer: Self-pay | Admitting: Internal Medicine

## 2018-02-12 ENCOUNTER — Ambulatory Visit
Admission: RE | Admit: 2018-02-12 | Discharge: 2018-02-12 | Disposition: A | Discharge status: Home / Self Care | Payer: 59 | Source: Ambulatory Visit | Attending: Internal Medicine | Admitting: Internal Medicine

## 2018-02-12 DIAGNOSIS — Z1231 Encounter for screening mammogram for malignant neoplasm of breast: Secondary | ICD-10-CM | POA: Insufficient documentation

## 2018-02-12 DIAGNOSIS — Z1239 Encounter for other screening for malignant neoplasm of breast: Secondary | ICD-10-CM

## 2018-02-22 ENCOUNTER — Encounter: Payer: Self-pay | Admitting: Internal Medicine

## 2018-02-22 ENCOUNTER — Ambulatory Visit (INDEPENDENT_AMBULATORY_CARE_PROVIDER_SITE_OTHER): Payer: 59 | Admitting: Internal Medicine

## 2018-02-22 ENCOUNTER — Ambulatory Visit (INDEPENDENT_AMBULATORY_CARE_PROVIDER_SITE_OTHER): Payer: 59

## 2018-02-22 VITALS — BP 116/62 | HR 90 | Temp 98.8°F | Resp 15 | Ht 70.0 in | Wt 188.6 lb

## 2018-02-22 DIAGNOSIS — M79675 Pain in left toe(s): Secondary | ICD-10-CM

## 2018-02-22 DIAGNOSIS — S92512A Displaced fracture of proximal phalanx of left lesser toe(s), initial encounter for closed fracture: Secondary | ICD-10-CM

## 2018-02-22 DIAGNOSIS — S92525A Nondisplaced fracture of medial phalanx of left lesser toe(s), initial encounter for closed fracture: Secondary | ICD-10-CM | POA: Diagnosis not present

## 2018-02-22 NOTE — Patient Instructions (Signed)
X  RAY TODAY to rule out metatarsal fracture  Add ibuprofen 600 mg every 8 hours and ice ice ice !

## 2018-02-22 NOTE — Progress Notes (Signed)
Subjective:  Patient ID: Dawn Griffith, female    DOB: [birth year]  Age: 52 y.o. MRN: [id number]  CC: The primary encounter diagnosis was Pain of toe of left foot. A diagnosis of Closed fracture of proximal phalanx of lesser toe of left foot, physeal involvement unspecified, initial encounter was also pertinent to this visit.  HPI Dawn Griffith presents for evaluation OF 5TH TOE INJURY  That ccurred Wednesday night .  She states that on the way to bed stubbed her toe on the living room couch,  Trying to avoid her sleeping cat .  She did not note any bruising unilt the following day , after she walked  on it at work on Thursday afternoon  Taking tylenol   Outpatient Medications Prior to Visit  Medication Sig Dispense Refill  . ALPRAZolam (XANAX) 0.5 MG tablet TAKE 2 TABLETS BY MOUTH DAILY AS NEEDED FOR ANXIETY 60 tablet 5  . amitriptyline (ELAVIL) 100 MG tablet TAKE ONE TABLET BY MOUTH EVERY NIGHT AT BEDTIME 90 tablet 0  . cetirizine (ZYRTEC) 10 MG tablet Take 10 mg by mouth daily.      . cholecalciferol (VITAMIN D) 1000 units tablet Take 1,000 Units by mouth daily.    . cyclobenzaprine (FLEXERIL) 10 MG tablet TAKE ONE TABLET 3 TIMES DAILY AS NEEDED FOR MUSCLE SPASMS 90 tablet 3  . docusate sodium (COLACE) 100 MG capsule Take 100 mg by mouth 2 (two) times daily.    . Lactobacillus (PROBIOTIC ACIDOPHILUS PO) Take 1 tablet by mouth daily.    Marland Kitchen linaclotide (LINZESS) 290 MCG CAPS capsule Take 1 capsule (290 mcg total) by mouth daily before breakfast. 30 capsule 5  . losartan (COZAAR) 100 MG tablet TAKE ONE TABLET EVERY DAY 90 tablet 2  . meloxicam (MOBIC) 15 MG tablet TAKE 1 TABLET BY MOUTH DAILY 90 tablet 1  . montelukast (SINGULAIR) 10 MG tablet Take 1 tablet (10 mg total) by mouth at bedtime. 90 tablet 1  . omeprazole (PRILOSEC) 40 MG capsule TAKE ONE CAPSULE BY MOUTH DAILY 30 capsule 3  . spironolactone (ALDACTONE) 25 MG tablet TAKE ONE TABLET BY MOUTH EVERY DAY 90 tablet 2  . zolpidem (AMBIEN  CR) 12.5 MG CR tablet TAKE ONE TABLET BY MOUTH EVERY NIGHT AT BEDTIME AS NEEDED FOR SLEEP 30 tablet 5   No facility-administered medications prior to visit.     Review of Systems;  Patient denies headache, fevers, malaise, unintentional weight loss, skin rash, eye pain, sinus congestion and sinus pain, sore throat, dysphagia,  hemoptysis , cough, dyspnea, wheezing, chest pain, palpitations, orthopnea, edema, abdominal pain, nausea, melena, diarrhea, constipation, flank pain, dysuria, hematuria, urinary  Frequency, nocturia, numbness, tingling, seizures,  Focal weakness, Loss of consciousness,  Tremor, insomnia, depression, anxiety, and suicidal ideation.      Objective:  BP 116/62 (BP Location: Left Arm, Patient Position: Sitting, Cuff Size: Normal)   Pulse 90   Temp 98.8 F (37.1 C) (Oral)   Resp 15   Ht 5\' 10"  (1.778 m)   Wt 188 lb 9.6 oz (85.5 kg)   LMP 05/01/2014 (Approximate)   SpO2 97%   BMI 27.06 kg/m   BP Readings from Last 3 Encounters:  02/22/18 116/62  01/28/18 114/76  10/18/17 110/78    Wt Readings from Last 3 Encounters:  02/22/18 188 lb 9.6 oz (85.5 kg)  01/28/18 185 lb (83.9 kg)  10/18/17 191 lb (86.6 kg)    General appearance: alert, cooperative and appears stated age MSK left foot  with bruising and swelling of 5th toe at base  Skin: Skin color, texture, turgor normal. No rashes or lesions Lymph nodes: Cervical, supraclavicular, and axillary nodes normal.  Lab Results  Component Value Date   HGBA1C 6.0 01/28/2018   HGBA1C 5.9 10/18/2017   HGBA1C 5.9 01/22/2017    Lab Results  Component Value Date   CREATININE 0.94 01/28/2018   CREATININE 0.91 10/18/2017   CREATININE 0.84 01/22/2017    Lab Results  Component Value Date   WBC 5.6 01/22/2017   HGB 13.8 01/22/2017   HCT 41.9 01/22/2017   PLT 311.0 01/22/2017   GLUCOSE 111 (H) 01/28/2018   CHOL 211 (H) 10/18/2017   TRIG 140.0 10/18/2017   HDL 42.90 10/18/2017   LDLDIRECT 155.0 01/22/2017    LDLCALC 140 (H) 10/18/2017   ALT 18 01/28/2018   AST 18 01/28/2018   NA 139 01/28/2018   K 4.4 01/28/2018   CL 102 01/28/2018   CREATININE 0.94 01/28/2018   BUN 12 01/28/2018   CO2 30 01/28/2018   TSH 0.96 01/28/2018   HGBA1C 6.0 01/28/2018   MICROALBUR 0.8 10/18/2017    Mm 3d Screen Breast Bilateral  Result Date: 02/12/2018 CLINICAL DATA:  Screening. EXAM: DIGITAL SCREENING BILATERAL MAMMOGRAM WITH TOMO AND CAD COMPARISON:  Previous exam(s). ACR Breast Density Category c: The breast tissue is heterogeneously dense, which may obscure small masses. FINDINGS: There are no findings suspicious for malignancy. Images were processed with CAD. IMPRESSION: No mammographic evidence of malignancy. A result letter of this screening mammogram will be mailed directly to the patient. RECOMMENDATION: Screening mammogram in one year. (Code:SM-B-01Y) BI-RADS CATEGORY  1: Negative. Electronically Signed   By: Kristopher Oppenheim M.D.   On: 02/12/2018 12:01    Assessment & Plan:   Problem List Items Addressed This Visit    Fracture of proximal phalanx of lesser toe    Left 5th toe,  Nondisplaced.  Buddy taping and hard sole shoe recommended.        Other Visit Diagnoses    Pain of toe of left foot    -  Primary   Relevant Orders   DG Toe 5th Left (Completed)      I am having Shary C. Folta maintain her cetirizine, Lactobacillus (PROBIOTIC ACIDOPHILUS PO), cyclobenzaprine, meloxicam, docusate sodium, cholecalciferol, losartan, spironolactone, montelukast, zolpidem, linaclotide, ALPRAZolam, omeprazole, and amitriptyline.  No orders of the defined types were placed in this encounter.   There are no discontinued medications.  Follow-up: No follow-ups on file.   Crecencio Mc, MD

## 2018-02-24 ENCOUNTER — Encounter: Payer: Self-pay | Admitting: Internal Medicine

## 2018-02-24 DIAGNOSIS — S92513A Displaced fracture of proximal phalanx of unspecified lesser toe(s), initial encounter for closed fracture: Secondary | ICD-10-CM | POA: Insufficient documentation

## 2018-02-24 NOTE — Assessment & Plan Note (Signed)
Left 5th toe,  Nondisplaced.  Buddy taping and hard sole shoe recommended.

## 2018-03-07 ENCOUNTER — Other Ambulatory Visit: Payer: Self-pay | Admitting: Internal Medicine

## 2018-03-18 DIAGNOSIS — D229 Melanocytic nevi, unspecified: Secondary | ICD-10-CM | POA: Diagnosis not present

## 2018-03-18 DIAGNOSIS — L821 Other seborrheic keratosis: Secondary | ICD-10-CM | POA: Diagnosis not present

## 2018-03-18 DIAGNOSIS — L820 Inflamed seborrheic keratosis: Secondary | ICD-10-CM | POA: Diagnosis not present

## 2018-03-19 DIAGNOSIS — Z6826 Body mass index (BMI) 26.0-26.9, adult: Secondary | ICD-10-CM | POA: Diagnosis not present

## 2018-03-19 DIAGNOSIS — J310 Chronic rhinitis: Secondary | ICD-10-CM | POA: Diagnosis not present

## 2018-05-15 ENCOUNTER — Other Ambulatory Visit: Payer: Self-pay | Admitting: Internal Medicine

## 2018-05-20 ENCOUNTER — Encounter: Payer: Self-pay | Admitting: Obstetrics and Gynecology

## 2018-05-20 ENCOUNTER — Ambulatory Visit: Payer: 59 | Admitting: Obstetrics and Gynecology

## 2018-05-20 VITALS — BP 119/71 | HR 71 | Ht 69.0 in | Wt 184.7 lb

## 2018-05-20 DIAGNOSIS — R7303 Prediabetes: Secondary | ICD-10-CM | POA: Diagnosis not present

## 2018-05-20 DIAGNOSIS — Z8371 Family history of colonic polyps: Secondary | ICD-10-CM | POA: Diagnosis not present

## 2018-05-20 DIAGNOSIS — Z8049 Family history of malignant neoplasm of other genital organs: Secondary | ICD-10-CM

## 2018-05-20 NOTE — Progress Notes (Signed)
  Subjective:     Patient ID: Dawn Griffith, female   DOB: [birth year], 52 y.o.   MRN: [id number]  HPI Here to establish care and discuss cancer screening. Mother was diagnosed with uterine cancer and polyp at age 108.  Patient had tubal pregnancy in 1997 and had partial right fallopian tube removed. Had a cesarean section in 1992.  Gallbladder removed 2012.  Started menopause at age 26 and done by age 67.  Last colonoscopy 2018 with one polyp noted.   Review of Systems  All other systems reviewed and are negative.      Objective:   Physical Exam A&Ox4 Well groomed female in no distress Blood pressure 119/71, pulse 71, height 5\' 9"  (1.753 m), weight 184 lb 11.2 oz (83.8 kg), last menstrual period 05/01/2014. Pelvic exam: normal external genitalia, vulva, vagina, cervix, uterus and adnexa.    Assessment:     Family history of uterine cancer in mother Family history of adenomatous polyposis(precancerous type)    Plan:     Desires genetic screening- blood sample obtained and will follow up accordingly Pelvic ultrasound ordered and will follow up accordingly, recommend repeating as screening every 2 years per national standards.    Melody Shambley,CNM

## 2018-05-21 ENCOUNTER — Other Ambulatory Visit: Payer: Self-pay | Admitting: Internal Medicine

## 2018-05-21 LAB — HEMOGLOBIN A1C
Est. average glucose Bld gHb Est-mCnc: 114 mg/dL
Hgb A1c MFr Bld: 5.6 % (ref 4.8–5.6)

## 2018-06-03 ENCOUNTER — Other Ambulatory Visit: Payer: Self-pay | Admitting: Internal Medicine

## 2018-06-03 DIAGNOSIS — K5904 Chronic idiopathic constipation: Secondary | ICD-10-CM

## 2018-06-04 ENCOUNTER — Ambulatory Visit (INDEPENDENT_AMBULATORY_CARE_PROVIDER_SITE_OTHER): Payer: 59

## 2018-06-04 DIAGNOSIS — Z8049 Family history of malignant neoplasm of other genital organs: Secondary | ICD-10-CM | POA: Diagnosis not present

## 2018-06-04 DIAGNOSIS — Z8371 Family history of colonic polyps: Secondary | ICD-10-CM | POA: Diagnosis not present

## 2018-06-04 DIAGNOSIS — D251 Intramural leiomyoma of uterus: Secondary | ICD-10-CM | POA: Diagnosis not present

## 2018-06-13 ENCOUNTER — Telehealth: Payer: Self-pay | Admitting: Obstetrics and Gynecology

## 2018-06-13 NOTE — Telephone Encounter (Signed)
Patient called requesting ultrasound results. Thanks

## 2018-06-13 NOTE — Telephone Encounter (Signed)
pls advise

## 2018-06-14 NOTE — Telephone Encounter (Signed)
I sent her a message via MyChart

## 2018-07-03 ENCOUNTER — Other Ambulatory Visit: Payer: Self-pay | Admitting: Internal Medicine

## 2018-07-18 ENCOUNTER — Telehealth: Payer: Self-pay | Admitting: Obstetrics and Gynecology

## 2018-07-18 NOTE — Telephone Encounter (Signed)
The patient would like to get her genetic testing and pick it up here at the office b/c she never got the other set that was mailed, and her address was verified today to be correct.  Her husband can come and get them for her today if possible, please advise, thanks.

## 2018-07-18 NOTE — Telephone Encounter (Signed)
done

## 2018-07-23 DIAGNOSIS — J329 Chronic sinusitis, unspecified: Secondary | ICD-10-CM | POA: Diagnosis not present

## 2018-07-30 ENCOUNTER — Encounter: Payer: Self-pay | Admitting: Family Medicine

## 2018-07-30 ENCOUNTER — Ambulatory Visit: Payer: Self-pay | Admitting: Family Medicine

## 2018-07-30 ENCOUNTER — Ambulatory Visit (INDEPENDENT_AMBULATORY_CARE_PROVIDER_SITE_OTHER): Payer: 59 | Admitting: Family Medicine

## 2018-07-30 VITALS — BP 120/80 | HR 95 | Temp 98.7°F | Resp 16 | Ht 69.0 in | Wt 189.0 lb

## 2018-07-30 DIAGNOSIS — M545 Low back pain, unspecified: Secondary | ICD-10-CM

## 2018-07-30 DIAGNOSIS — M62838 Other muscle spasm: Secondary | ICD-10-CM

## 2018-07-30 MED ORDER — METHYLPREDNISOLONE ACETATE 80 MG/ML IJ SUSP
80.0000 mg | Freq: Once | INTRAMUSCULAR | Status: AC
  Administered 2018-07-30: 80 mg via INTRAMUSCULAR

## 2018-07-30 MED ORDER — CYCLOBENZAPRINE HCL 10 MG PO TABS: ORAL_TABLET | ORAL | 2 refills | Status: DC

## 2018-07-30 MED ORDER — KETOROLAC TROMETHAMINE 60 MG/2ML IM SOLN
60.0000 mg | Freq: Once | INTRAMUSCULAR | Status: AC
  Administered 2018-07-30: 60 mg via INTRAMUSCULAR

## 2018-07-30 MED ORDER — ACETAMINOPHEN-CODEINE 300-30 MG PO TABS: 1.0000 | ORAL_TABLET | Freq: Four times a day (QID) | ORAL | 0 refills | Status: DC | PRN

## 2018-07-30 MED ORDER — PREDNISONE 10 MG PO TABS: ORAL_TABLET | ORAL | 0 refills | Status: DC

## 2018-07-30 NOTE — Progress Notes (Signed)
Subjective:    Patient ID: Dawn Griffith, female    DOB: Apr 02, 1966, 53 y.o.   MRN: [id number]  HPI   Patient presents to clinic with left-sided low back pain.  Unsure of how she hurt herself -- believes she may have twisted self when she got out of her car yesterday, causing pulling and twisting of left-sided low back muscles.  She has been taking Advil and Tylenol at home with minimal effect in reducing pain.  Also took a 20 mg dose of husband's leftover prednisone this morning.  States if she stands or sits for too long she will have to get up and move due to pain.  Cannot stand completely straight due to pulling pain in left low back.  Denies any saddle anesthesia, denies numbness or tingling in legs, denies loss of bowel or bladder control, denies fever or chills.  Patient Active Problem List   Diagnosis Date Noted  . Fracture of proximal phalanx of lesser toe 02/24/2018  . Allergic rhinitis with postnasal drip 06/21/2017  . Vitamin D deficiency 07/04/2016  . 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 Prediabetes 01/02/2016  . Left hip pain 02/23/2015  . Travel advice encounter 08/05/2014  . Herpes zoster 07/20/2014  . Fibroid uterus 07/07/2014  . Family history of FAP (familial adenomatous polyposis) 07/07/2014  . Goiter diffuse 03/05/2014  . Hyperlipidemia LDL goal <130 11/07/2013  . Essential hypertension, benign 11/07/2013  . Anemia 11/28/2012  . Overweight (BMI 25.0-29.9) 11/04/2012  . Encounter for preventive health examination 11/03/2011  . Constipation 05/07/2011  . GERD (gastroesophageal reflux disease) 12/30/2010  . Anxiety 12/30/2010   Social History   Tobacco Use  . Smoking status: Never Smoker  . Smokeless tobacco: Never Used  Substance Use Topics  . Alcohol use: No    Alcohol/week: 0.0 standard drinks   Review of Systems  Constitutional: Negative for chills, fatigue and fever.  HENT: Negative for congestion, ear pain, sinus pain and sore throat.     Eyes: Negative.   Respiratory: Negative for cough, shortness of breath and wheezing.   Cardiovascular: Negative for chest pain, palpitations and leg swelling.  Gastrointestinal: Negative for abdominal pain, diarrhea, nausea and vomiting.  Genitourinary: Negative for dysuria, frequency and urgency.  Musculoskeletal: +left sided low back pain Skin: Negative for color change, pallor and rash.  Neurological: Negative for syncope, light-headedness and headaches.  Psychiatric/Behavioral: The patient is not nervous/anxious.       Objective:   Physical Exam Constitutional:      Appearance: She is not toxic-appearing.     Comments: Appears uncomfortable, difficult to sit due to pain  HENT:     Head: Normocephalic and atraumatic.  Eyes:     General: No scleral icterus.    Extraocular Movements: Extraocular movements intact.     Pupils: Pupils are equal, round, and reactive to light.  Neck:     Musculoskeletal: Normal range of motion and neck supple. No neck rigidity.  Cardiovascular:     Rate and Rhythm: Normal rate and regular rhythm.  Pulmonary:     Effort: Pulmonary effort is normal. No respiratory distress.     Breath sounds: Normal breath sounds.  Musculoskeletal:       Back:     Comments: Area of tenderness in low back represented by 2 cervical on diagram.  Patient has difficulty completely standing up straight due to pulling pain in left low back.  Patient walks with upper body bent slightly forward and left hand  and left low back to help pain.  Quadricep strength equal and strong.  Grips equal and strong.  Skin:    General: Skin is warm and dry.  Neurological:     Mental Status: She is alert and oriented to person, place, and time.  Psychiatric:        Mood and Affect: Mood normal.        Behavior: Behavior normal.    Vitals:   07/30/18 1145  BP: 120/80  Pulse: 95  Resp: 16  Temp: 98.7 F (37.1 C)  SpO2: 95%      Assessment & Plan:    Acute left-sided low back  pain, muscle spasm - IM methylprednisolone 80 mg and Toradol 60 injections given in clinic today to help quickly reduce pain.  She will take oral steroid taper beginning with 60 mg today and reduce by 10 mg daily until this are gone.  Sent in cyclobenzaprine to use as needed for muscle relaxation, and Tylenol #3 for more moderate to severe pain.  [facility] PMP registry reviewed and is appropriate for prescription of Tylenol #3.  Patient advised that Tylenol 3 and cyclobenzaprine have potential to cause drowsiness so do not use in conjunction with Ambien or take prior to driving.  Also suggested she try topical rubs like BenGay or Biofreeze, heating pad and gentle stretching and range of motion exercises to help pain.  Administrations This Visit    ketorolac (TORADOL) injection 60 mg    Admin Date 07/30/2018 Action Given Dose 60 mg Route Intramuscular Administered By Neta Ehlers, RMA       methylPREDNISolone acetate (DEPO-MEDROL) injection 80 mg    Admin Date 07/30/2018 Action Given Dose 80 mg Route Intramuscular Administered By Neta Ehlers, RMA         Patient will keep regularly scheduled follow-up in clinic as planned.  Advised to return to clinic sooner if current symptoms do not improve or worsen.

## 2018-07-31 ENCOUNTER — Ambulatory Visit: Payer: 59 | Admitting: Internal Medicine

## 2018-07-31 ENCOUNTER — Encounter: Payer: Self-pay | Admitting: Internal Medicine

## 2018-07-31 VITALS — BP 110/68 | HR 99 | Temp 99.2°F | Resp 16 | Ht 69.0 in | Wt 185.0 lb

## 2018-07-31 DIAGNOSIS — R7303 Prediabetes: Secondary | ICD-10-CM | POA: Diagnosis not present

## 2018-07-31 DIAGNOSIS — E785 Hyperlipidemia, unspecified: Secondary | ICD-10-CM | POA: Diagnosis not present

## 2018-07-31 DIAGNOSIS — L299 Pruritus, unspecified: Secondary | ICD-10-CM | POA: Diagnosis not present

## 2018-07-31 DIAGNOSIS — R69 Illness, unspecified: Secondary | ICD-10-CM | POA: Diagnosis not present

## 2018-07-31 DIAGNOSIS — K5904 Chronic idiopathic constipation: Secondary | ICD-10-CM

## 2018-07-31 DIAGNOSIS — E663 Overweight: Secondary | ICD-10-CM

## 2018-07-31 DIAGNOSIS — K5909 Other constipation: Secondary | ICD-10-CM | POA: Diagnosis not present

## 2018-07-31 DIAGNOSIS — F5105 Insomnia due to other mental disorder: Secondary | ICD-10-CM

## 2018-07-31 DIAGNOSIS — F419 Anxiety disorder, unspecified: Secondary | ICD-10-CM

## 2018-07-31 LAB — COMPREHENSIVE METABOLIC PANEL
ALT: 14 U/L (ref 0–35)
AST: 14 U/L (ref 0–37)
Albumin: 4.6 g/dL (ref 3.5–5.2)
Alkaline Phosphatase: 68 U/L (ref 39–117)
BUN: 12 mg/dL (ref 6–23)
CO2: 29 mEq/L (ref 19–32)
Calcium: 9.9 mg/dL (ref 8.4–10.5)
Chloride: 102 mEq/L (ref 96–112)
Creatinine, Ser: 0.88 mg/dL (ref 0.40–1.20)
GFR: 66.79 mL/min (ref >60.00)
Glucose, Bld: 89 mg/dL (ref 70–99)
Potassium: 4 mEq/L (ref 3.5–5.1)
Sodium: 140 mEq/L (ref 135–145)
Total Bilirubin: 0.5 mg/dL (ref 0.2–1.2)
Total Protein: 7.4 g/dL (ref 6.0–8.3)

## 2018-07-31 LAB — CBC WITH DIFFERENTIAL/PLATELET
Basophils Absolute: 0.1 10*3/uL (ref 0.0–0.1)
Basophils Relative: 0.7 % (ref 0.0–3.0)
EOS PCT: 1.7 % (ref 0.0–5.0)
Eosinophils Absolute: 0.1 10*3/uL (ref 0.0–0.7)
HEMATOCRIT: 40 % (ref 36.0–46.0)
Hemoglobin: 13.6 g/dL (ref 12.0–15.0)
LYMPHS PCT: 44.8 % (ref 12.0–46.0)
Lymphs Abs: 3.7 10*3/uL (ref 0.7–4.0)
MCHC: 34 g/dL (ref 30.0–36.0)
MCV: 89.8 fl (ref 78.0–100.0)
MONOS PCT: 7.5 % (ref 3.0–12.0)
Monocytes Absolute: 0.6 10*3/uL (ref 0.1–1.0)
Neutro Abs: 3.7 10*3/uL (ref 1.4–7.7)
Neutrophils Relative %: 45.3 % (ref 43.0–77.0)
Platelets: 304 10*3/uL (ref 150.0–400.0)
RBC: 4.46 Mil/uL (ref 3.87–5.11)
RDW: 12.6 % (ref 11.5–15.5)
WBC: 8.2 10*3/uL (ref 4.0–10.5)

## 2018-07-31 LAB — LIPID PANEL
CHOL/HDL RATIO: 4
Cholesterol: 229 mg/dL — ABNORMAL HIGH (ref 0–200)
HDL: 51.6 mg/dL (ref >39.00)
LDL Cholesterol: 147 mg/dL — ABNORMAL HIGH (ref 0–99)
NonHDL: 177.19
Triglycerides: 152 mg/dL — ABNORMAL HIGH (ref 0.0–149.0)
VLDL: 30.4 mg/dL (ref 0.0–40.0)

## 2018-07-31 MED ORDER — LINACLOTIDE 290 MCG PO CAPS: 290.0000 ug | ORAL_CAPSULE | Freq: Every day | ORAL | 5 refills | Status: DC

## 2018-07-31 NOTE — Progress Notes (Signed)
Subjective:  Patient ID: Dawn Griffith, female    DOB: 1965/10/19  Age: 53 y.o. MRN: [id number]  CC: The primary encounter diagnosis was Hyperlipidemia LDL goal <130. Diagnoses of Chronic idiopathic constipation, Itching, Prediabetes, Insomnia secondary to anxiety, Overweight (BMI 25.0-29.9), Chronic constipation, and Anxiety were also pertinent to this visit.  HPI [name] presents for 6 month follow up on prediabetes, overweight , hypertension and GAD   Treated yesterday for L sided back pain w/o sciatica . Given steroid injection, toradol , steroid taper,  Flexeril and tylenol #3 .  Developed diffuse pruritic reaction   Takings ambien and amitryptiline 100 mg  each night for insomnia averages 4-5 hours per night.  Trouble falling asleep and getting back to sleep  Overweight: has lost 5 lbs cutting out Coke,  Using seltzer water and Propel.     Outpatient Medications Prior to Visit  Medication Sig Dispense Refill  . Acetaminophen-Codeine (TYLENOL/CODEINE #3) 300-30 MG tablet Take 1 tablet by mouth every 6 (six) hours as needed for pain. 20 tablet 0  . ALPRAZolam (XANAX) 0.5 MG tablet TAKE 2 TABLETS BY MOUTH DAILY AS NEEDED FOR ANXIETY 60 tablet 5  . amitriptyline (ELAVIL) 100 MG tablet TAKE ONE TABLET BY MOUTH EVERY NIGHT AT BEDTIME 90 tablet 2  . cetirizine (ZYRTEC) 10 MG tablet Take 10 mg by mouth daily.      . cholecalciferol (VITAMIN D) 1000 units tablet Take 1,000 Units by mouth daily.    . cyclobenzaprine (FLEXERIL) 10 MG tablet TAKE ONE TABLET 3 TIMES DAILY AS NEEDED FOR MUSCLE SPASMS 30 tablet 2  . docusate sodium (COLACE) 100 MG capsule Take 100 mg by mouth 2 (two) times daily.    . Lactobacillus (PROBIOTIC ACIDOPHILUS PO) Take 1 tablet by mouth daily.    Marland Kitchen losartan (COZAAR) 100 MG tablet TAKE ONE TABLET BY MOUTH DAILY 90 tablet 1  . meloxicam (MOBIC) 15 MG tablet TAKE 1 TABLET BY MOUTH DAILY 90 tablet 1  . omeprazole (PRILOSEC) 40 MG capsule TAKE ONE CAPSULE BY MOUTH  DAILY 30 capsule 2  . predniSONE (DELTASONE) 10 MG tablet Take 6 tablets today, reduce by one tablet each day until gone 21 tablet 0  . spironolactone (ALDACTONE) 25 MG tablet TAKE ONE TABLET BY MOUTH DAILY 90 tablet 1  . zolpidem (AMBIEN CR) 12.5 MG CR tablet TAKE ONE TABLET BY MOUTH EVERY NIGHT AT BEDTIME AS NEEDED FOR SLEEP 30 tablet 4  . linaclotide (LINZESS) 290 MCG CAPS capsule Take 1 capsule (290 mcg total) by mouth daily before breakfast. 30 capsule 5  . montelukast (SINGULAIR) 10 MG tablet Take 1 tablet (10 mg total) by mouth at bedtime. (Patient not taking: Reported on 07/31/2018) 90 tablet 1  . LINZESS 145 MCG CAPS capsule TAKE ONE CAPSULE BY MOUTH EVERY MORNING BEFORE BREAKFAST (Patient not taking: Reported on 07/31/2018) 90 capsule 0   No facility-administered medications prior to visit.     Review of Systems;  Patient denies headache, fevers, malaise, unintentional weight loss, skin rash, eye pain, sinus congestion and sinus pain, sore throat, dysphagia,  hemoptysis , cough, dyspnea, wheezing, chest pain, palpitations, orthopnea, edema, abdominal pain, nausea, melena, diarrhea, constipation, flank pain, dysuria, hematuria, urinary  Frequency, nocturia, numbness, tingling, seizures,  Focal weakness, Loss of consciousness,  Tremor, insomnia, depression, anxiety, and suicidal ideation.      Objective:  BP 110/68 (BP Location: Left Arm, Patient Position: Sitting, Cuff Size: Large)   Pulse 99   Temp 99.2 F (  37.3 C) (Oral)   Resp 16   Ht 5\' 9"  (1.753 m)   Wt 185 lb (83.9 kg)   LMP 05/01/2014 (Approximate)   SpO2 97%   BMI 27.32 kg/m   BP Readings from Last 3 Encounters:  07/31/18 110/68  07/30/18 120/80  05/20/18 119/71    Wt Readings from Last 3 Encounters:  07/31/18 185 lb (83.9 kg)  07/30/18 189 lb (85.7 kg)  05/20/18 184 lb 11.2 oz (83.8 kg)    General appearance: alert, cooperative and appears stated age Ears: normal TM's and external ear canals both  ears Throat: lips, mucosa, and tongue normal; teeth and gums normal Neck: no adenopathy, no carotid bruit, supple, symmetrical, trachea midline and thyroid not enlarged, symmetric, no tenderness/mass/nodules Back: symmetric, no curvature. ROM normal. No CVA tenderness. Lungs: clear to auscultation bilaterally Heart: regular rate and rhythm, S1, S2 normal, no murmur, click, rub or gallop Abdomen: soft, non-tender; bowel sounds normal; no masses,  no organomegaly Pulses: 2+ and symmetric Skin: Skin color, texture, turgor normal. No rashes or lesions Lymph nodes: Cervical, supraclavicular, and axillary nodes normal.  Lab Results  Component Value Date   HGBA1C 5.6 05/20/2018   HGBA1C 6.0 01/28/2018   HGBA1C 5.9 10/18/2017    Lab Results  Component Value Date   CREATININE 0.88 07/31/2018   CREATININE 0.94 01/28/2018   CREATININE 0.91 10/18/2017    Lab Results  Component Value Date   WBC 8.2 07/31/2018   HGB 13.6 07/31/2018   HCT 40.0 07/31/2018   PLT 304.0 07/31/2018   GLUCOSE 89 07/31/2018   CHOL 229 (H) 07/31/2018   TRIG 152.0 (H) 07/31/2018   HDL 51.60 07/31/2018   LDLDIRECT 155.0 01/22/2017   LDLCALC 147 (H) 07/31/2018   ALT 14 07/31/2018   AST 14 07/31/2018   NA 140 07/31/2018   K 4.0 07/31/2018   CL 102 07/31/2018   CREATININE 0.88 07/31/2018   BUN 12 07/31/2018   CO2 29 07/31/2018   TSH 0.96 01/28/2018   HGBA1C 5.6 05/20/2018   MICROALBUR 0.8 10/18/2017    Mm 3d Screen Breast Bilateral  Result Date: 02/12/2018 CLINICAL DATA:  Screening. EXAM: DIGITAL SCREENING BILATERAL MAMMOGRAM WITH TOMO AND CAD COMPARISON:  Previous exam(s). ACR Breast Density Category c: The breast tissue is heterogeneously dense, which may obscure small masses. FINDINGS: There are no findings suspicious for malignancy. Images were processed with CAD. IMPRESSION: No mammographic evidence of malignancy. A result letter of this screening mammogram will be mailed directly to the patient.  RECOMMENDATION: Screening mammogram in one year. (Code:SM-B-01Y) BI-RADS CATEGORY  1: Negative. Electronically Signed   By: Kristopher Oppenheim M.D.   On: 02/12/2018 12:01    Assessment & Plan:   Problem List Items Addressed This Visit    Prediabetes    She has lowered her A1c with  A lower  glycemic index diet .  encouraged to particpate regularly in an aerobic activity.  We should check an A1c in 6 months.   Lab Results  Component Value Date   HGBA1C 5.6 05/20/2018         Overweight (BMI 25.0-29.9)    I have addressed  BMI and recommended a low glycemic index diet utilizing smaller more frequent meals to increase metabolism.  I have also recommended that patient start exercising with a goal of 30 minutes of aerobic exercise a minimum of 5 days per week.       Insomnia secondary to anxiety    Managed with elavil and  ambien.  No changes today.  encouraged to add daily exercise       Hyperlipidemia LDL goal <130 - Primary   Relevant Orders   Lipid panel (Completed)   Chronic constipation    not well managed on lower dose of Linzess .  Higher dose prescribed.       Relevant Medications   linaclotide (LINZESS) 290 MCG CAPS capsule   Anxiety    Managed with prn alprazolam,  And ambien/elavil for insomnia secondary to anxiety.  The risks and benefits of benzodiazepine use were reviewed with patient today including excessive sedation leading to respiratory depression,  impaired thinking/driving, and addiction.  Patient was advised to avoid concurrent use with alcohol, to use medication only as needed and not to share with others         Other Visit Diagnoses    Itching       Relevant Orders   CBC with Differential/Platelet (Completed)   Comprehensive metabolic panel (Completed)    A total of 25 minutes of face to face time was spent with patient more than half of which was spent in counselling about the above mentioned conditions  and coordination of care   I have discontinued  Naveah C. Alix's LINZESS. I am also having her maintain her cetirizine, Lactobacillus (PROBIOTIC ACIDOPHILUS PO), meloxicam, docusate sodium, cholecalciferol, montelukast, ALPRAZolam, spironolactone, losartan, amitriptyline, zolpidem, omeprazole, Acetaminophen-Codeine, cyclobenzaprine, predniSONE, and linaclotide.  Meds ordered this encounter  Medications  . linaclotide (LINZESS) 290 MCG CAPS capsule    Sig: Take 1 capsule (290 mcg total) by mouth daily before breakfast.    Dispense:  30 capsule    Refill:  5    Generic ok    Medications Discontinued During This Encounter  Medication Reason  . LINZESS 145 MCG CAPS capsule Duplicate  . linaclotide (LINZESS) 290 MCG CAPS capsule Reorder    Follow-up: Return in about 6 months (around 01/29/2019).   Crecencio Mc, MD

## 2018-07-31 NOTE — Patient Instructions (Signed)
Your itching was probably due to the Toradol injection.  Continue the prednisone taper and add zyrtec/allegra/claritin (or benadryl) to manage the itching  When you are feeling better ,  START EXERCISING   Increase your daily dose of Linzess to 290 mg and stay on that dose  See you in 6 months

## 2018-08-01 NOTE — Assessment & Plan Note (Signed)
Managed with prn alprazolam,  And ambien/elavil for insomnia secondary to anxiety.  The risks and benefits of benzodiazepine use were reviewed with patient today including excessive sedation leading to respiratory depression,  impaired thinking/driving, and addiction.  Patient was advised to avoid concurrent use with alcohol, to use medication only as needed and not to share with others

## 2018-08-01 NOTE — Assessment & Plan Note (Signed)
I have addressed  BMI and recommended a low glycemic index diet utilizing smaller more frequent meals to increase metabolism.  I have also recommended that patient start exercising with a goal of 30 minutes of aerobic exercise a minimum of 5 days per week.  

## 2018-08-01 NOTE — Assessment & Plan Note (Signed)
not well managed on lower dose of Linzess .  Higher dose prescribed.

## 2018-08-01 NOTE — Assessment & Plan Note (Signed)
She has lowered her A1c with  A lower  glycemic index diet .  encouraged to particpate regularly in an aerobic activity.  We should check an A1c in 6 months.   Lab Results  Component Value Date   HGBA1C 5.6 05/20/2018

## 2018-08-01 NOTE — Assessment & Plan Note (Signed)
Managed with elavil and ambien.  No changes today.  encouraged to add daily exercise

## 2018-08-05 ENCOUNTER — Emergency Department
Admission: EM | Admit: 2018-08-05 | Discharge: 2018-08-05 | Disposition: A | Discharge status: Home / Self Care | Payer: 59 | Attending: Emergency Medicine | Admitting: Emergency Medicine

## 2018-08-05 ENCOUNTER — Other Ambulatory Visit: Payer: Self-pay

## 2018-08-05 ENCOUNTER — Encounter: Payer: Self-pay | Admitting: Emergency Medicine

## 2018-08-05 DIAGNOSIS — R000 Tachycardia, unspecified: Secondary | ICD-10-CM | POA: Diagnosis not present

## 2018-08-05 DIAGNOSIS — E860 Dehydration: Secondary | ICD-10-CM | POA: Diagnosis not present

## 2018-08-05 DIAGNOSIS — R197 Diarrhea, unspecified: Secondary | ICD-10-CM | POA: Insufficient documentation

## 2018-08-05 DIAGNOSIS — Z859 Personal history of malignant neoplasm, unspecified: Secondary | ICD-10-CM | POA: Insufficient documentation

## 2018-08-05 DIAGNOSIS — I10 Essential (primary) hypertension: Secondary | ICD-10-CM | POA: Diagnosis not present

## 2018-08-05 DIAGNOSIS — R42 Dizziness and giddiness: Secondary | ICD-10-CM | POA: Diagnosis present

## 2018-08-05 LAB — URINALYSIS, COMPLETE (UACMP) WITH MICROSCOPIC
Bilirubin Urine: NEGATIVE
Glucose, UA: NEGATIVE mg/dL
Hgb urine dipstick: NEGATIVE
Ketones, ur: NEGATIVE mg/dL
Leukocytes,Ua: NEGATIVE
Nitrite: NEGATIVE
Protein, ur: NEGATIVE mg/dL
Specific Gravity, Urine: 1.001 — ABNORMAL LOW (ref 1.005–1.030)
Squamous Epithelial / HPF: NONE SEEN (ref 0–5)
WBC, UA: NONE SEEN WBC/hpf (ref 0–5)
pH: 6 (ref 5.0–8.0)

## 2018-08-05 LAB — CBC WITH DIFFERENTIAL/PLATELET
ABS IMMATURE GRANULOCYTES: 0.03 10*3/uL (ref 0.00–0.07)
Basophils Absolute: 0.1 10*3/uL (ref 0.0–0.1)
Basophils Relative: 0 %
Eosinophils Absolute: 0.1 10*3/uL (ref 0.0–0.5)
Eosinophils Relative: 1 %
HCT: 43.9 % (ref 36.0–46.0)
Hemoglobin: 14.3 g/dL (ref 12.0–15.0)
Immature Granulocytes: 0 %
LYMPHS PCT: 33 %
Lymphs Abs: 3.9 10*3/uL (ref 0.7–4.0)
MCH: 29.9 pg (ref 26.0–34.0)
MCHC: 32.6 g/dL (ref 30.0–36.0)
MCV: 91.8 fL (ref 80.0–100.0)
Monocytes Absolute: 0.8 10*3/uL (ref 0.1–1.0)
Monocytes Relative: 7 %
NEUTROS ABS: 7 10*3/uL (ref 1.7–7.7)
Neutrophils Relative %: 59 %
Platelets: 399 10*3/uL (ref 150–400)
RBC: 4.78 MIL/uL (ref 3.87–5.11)
RDW: 12.3 % (ref 11.5–15.5)
WBC: 11.9 10*3/uL — ABNORMAL HIGH (ref 4.0–10.5)
nRBC: 0 % (ref 0.0–0.2)

## 2018-08-05 LAB — LIPASE, BLOOD: Lipase: 41 U/L (ref 11–51)

## 2018-08-05 LAB — COMPREHENSIVE METABOLIC PANEL
ALT: 25 U/L (ref 0–44)
AST: 22 U/L (ref 15–41)
Albumin: 4.9 g/dL (ref 3.5–5.0)
Alkaline Phosphatase: 70 U/L (ref 38–126)
Anion gap: 10 (ref 5–15)
BUN: 11 mg/dL (ref 6–20)
CO2: 27 mmol/L (ref 22–32)
Calcium: 9.4 mg/dL (ref 8.9–10.3)
Chloride: 106 mmol/L (ref 98–111)
Creatinine, Ser: 0.82 mg/dL (ref 0.44–1.00)
GFR calc Af Amer: >60 mL/min (ref >60)
GFR calc non Af Amer: >60 mL/min (ref >60)
GLUCOSE: 173 mg/dL — AB (ref 70–99)
Potassium: 3.7 mmol/L (ref 3.5–5.1)
Sodium: 143 mmol/L (ref 135–145)
Total Bilirubin: 0.6 mg/dL (ref 0.3–1.2)
Total Protein: 8 g/dL (ref 6.5–8.1)

## 2018-08-05 LAB — TROPONIN I: Troponin I: <0.03 ng/mL (ref <0.03)

## 2018-08-05 NOTE — ED Notes (Signed)
Urine specimen sent to lab. Specimen is completely clear and not very warm.

## 2018-08-05 NOTE — ED Provider Notes (Signed)
[facility] Emergency Department Provider Note       Time seen: ----------------------------------------- 10:34 PM on 08/05/2018 -----------------------------------------   I have reviewed the triage vital signs and the nursing notes.  HISTORY   Chief Complaint Dizziness    HPI Dawn Griffith is a 53 y.o. female with a history of constipation, hypertension, hyperlipidemia, GERD who presents to the ED for feelings of dehydration.  She denies any abdominal pain or nausea.  She does have a history of constipation.  She began taking Linzess and then took Dulcolax and began having diarrhea over the weekend.  Past Medical History:  Diagnosis Date  . Cancer [facility])    as a child  . Chronic constipation   . Hypertension     Patient Active Problem List   Diagnosis Date Noted  . Fracture of proximal phalanx of lesser toe 02/24/2018  . Allergic rhinitis with postnasal drip 06/21/2017  . Vitamin D deficiency 07/04/2016  . 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 Prediabetes 01/02/2016  . Left hip pain 02/23/2015  . Travel advice encounter 08/05/2014  . Herpes zoster 07/20/2014  . Fibroid uterus 07/07/2014  . Family history of FAP (familial adenomatous polyposis) 07/07/2014  . Goiter diffuse 03/05/2014  . Hyperlipidemia LDL goal <130 11/07/2013  . Essential hypertension, benign 11/07/2013  . Anemia 11/28/2012  . Overweight (BMI 25.0-29.9) 11/04/2012  . Encounter for preventive health examination 11/03/2011  . Chronic constipation 05/07/2011  . GERD (gastroesophageal reflux disease) 12/30/2010  . Anxiety 12/30/2010    Past Surgical History:  Procedure Laterality Date  . CESAREAN SECTION  1992  . CHOLECYSTECTOMY  Dec 2012   [place]  . COLONOSCOPY    . [place]  . LYMPH NODE BIOPSY    . NASAL SINUS SURGERY      Allergies Patient has no known allergies.  Social History Social History   Tobacco Use  . Smoking status:  Never Smoker  . Smokeless tobacco: Never Used  Substance Use Topics  . Alcohol use: No    Alcohol/week: 0.0 standard drinks  . Drug use: No    Review of Systems Constitutional: Negative for fever. Cardiovascular: Negative for chest pain. Respiratory: Negative for shortness of breath. Gastrointestinal: Positive for diarrhea Musculoskeletal: Negative for back pain. Skin: Negative for rash. Neurological: Negative for headaches, focal weakness or numbness.  All systems negative/normal/unremarkable except as stated in the HPI  ____________________________________________   PHYSICAL EXAM:  VITAL SIGNS: ED Triage Vitals [08/05/18 2041]  Enc Vitals Group     BP (!) 165/98     Pulse Rate (!) 117     Resp 18     Temp 98.3 F (36.8 C)     Temp Source Oral     SpO2 100 %     Weight 184 lb 15.5 oz (83.9 kg)     Height 5\' 9"  (1.753 m)     Head Circumference      Peak Flow      Pain Score 0     Pain Loc      Pain Edu?      Excl. in [place]?    Constitutional: Alert and oriented. Well appearing and in no distress. Eyes: Conjunctivae are normal. Normal extraocular movements. Cardiovascular: Normal rate, regular rhythm. No murmurs, rubs, or gallops. Respiratory: Normal respiratory effort without tachypnea nor retractions. Breath sounds are clear and equal bilaterally. No wheezes/rales/rhonchi. Gastrointestinal: Soft and nontender. Normal bowel sounds Musculoskeletal: Nontender with normal range of motion  in extremities. No lower extremity tenderness nor edema. Neurologic:  Normal speech and language. No gross focal neurologic deficits are appreciated.  Skin:  Skin is warm, dry and intact. No rash noted. Psychiatric: Mood and affect are normal. Speech and behavior are normal.  ____________________________________________  EKG: Interpreted by me.  Sinus tachycardia with rate of 111 bpm, normal PR interval, normal QRS, normal QT  ____________________________________________  ED  COURSE:  As part of my medical decision making, I reviewed the following data within the [place] History obtained from family if available, nursing notes, old chart and ekg, as well as notes from prior ED visits. Patient presented for diarrhea, we will assess with labs and imaging as indicated at this time.   Procedures ____________________________________________   LABS (pertinent positives/negatives)  Labs Reviewed  CBC WITH DIFFERENTIAL/PLATELET - Abnormal; Notable for the following components:      Result Value   WBC 11.9 (*)    All other components within normal limits  COMPREHENSIVE METABOLIC PANEL - Abnormal; Notable for the following components:   Glucose, Bld 173 (*)    All other components within normal limits  URINALYSIS, COMPLETE (UACMP) WITH MICROSCOPIC - Abnormal; Notable for the following components:   Color, Urine COLORLESS (*)    APPearance CLEAR (*)    Specific Gravity, Urine 1.001 (*)    Bacteria, UA RARE (*)    All other components within normal limits  TROPONIN I  LIPASE, BLOOD   ____________________________________________   DIFFERENTIAL DIAGNOSIS   Dehydration, electrolyte abnormality, anxiety, medication side effect  FINAL ASSESSMENT AND PLAN  Diarrhea   Plan: The patient had presented for diarrhea and feelings of dehydration. Patient's labs were unremarkable.  Patient states she feels better currently, she has had fluids including water and Pedialyte.  She is cleared for outpatient follow-up.   Laurence Aly, MD    Note: This note was generated in part or whole with voice recognition software. Voice recognition is usually quite accurate but there are transcription errors that can and very often do occur. I apologize for any typographical errors that were not detected and corrected.     Earleen Newport, MD 08/05/18 [id number]

## 2018-08-05 NOTE — ED Triage Notes (Addendum)
Patient ambulatory to triage with steady gait, without difficulty or distress noted; pt reports feeling dehydrated; denies abd pain or nausea; st hx of constipation; began taking Linzess and then took dulcolax and began having diarrhea over the weekend

## 2018-08-20 ENCOUNTER — Other Ambulatory Visit: Payer: Self-pay | Admitting: Internal Medicine

## 2018-08-20 DIAGNOSIS — F419 Anxiety disorder, unspecified: Secondary | ICD-10-CM

## 2018-08-20 NOTE — Telephone Encounter (Signed)
Last OV   Xanax last refilled 01/28/2018 disp 60 with 5 refills   Next appt 08/28/2018   Sent to PCP for approval for controled.

## 2018-08-21 IMAGING — MG MM DIGITAL SCREENING BILAT W/ TOMO W/ CAD
8 of 12 series · 8 of 28 positions shown · non-contrast
Comparison: Previous exam(s).

CLINICAL DATA: Screening.

EXAM:
2D DIGITAL SCREENING BILATERAL MAMMOGRAM WITH CAD AND ADJUNCT TOMO

[R CC]
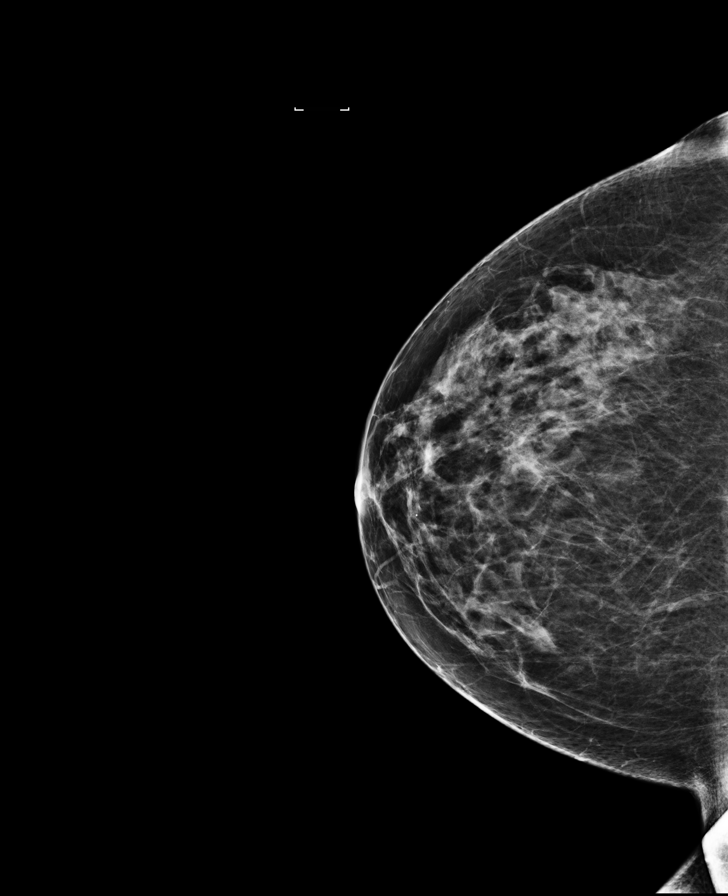

[L MLO]
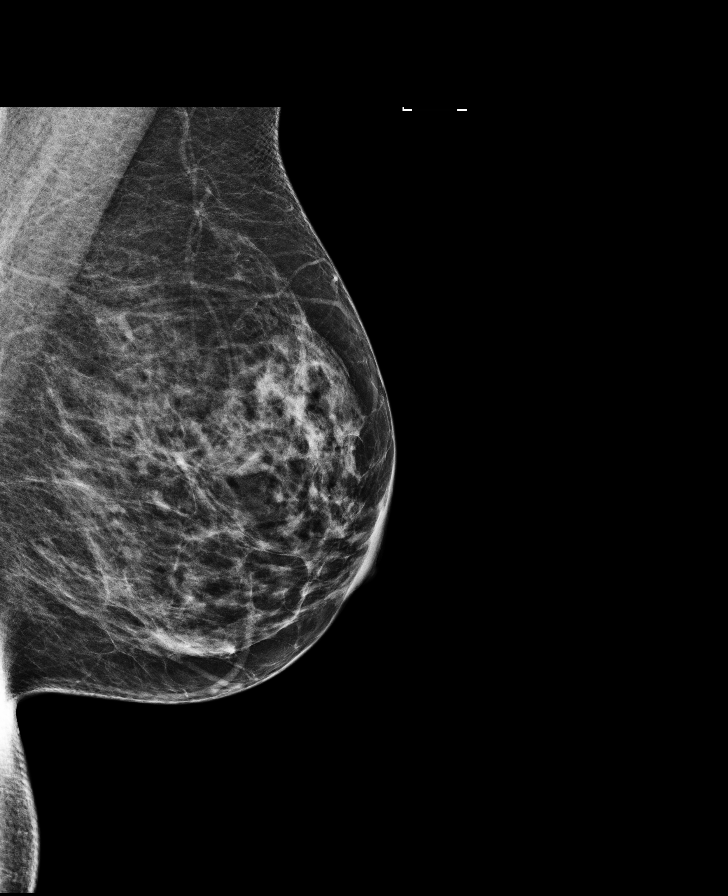

[L CC synth-2D]
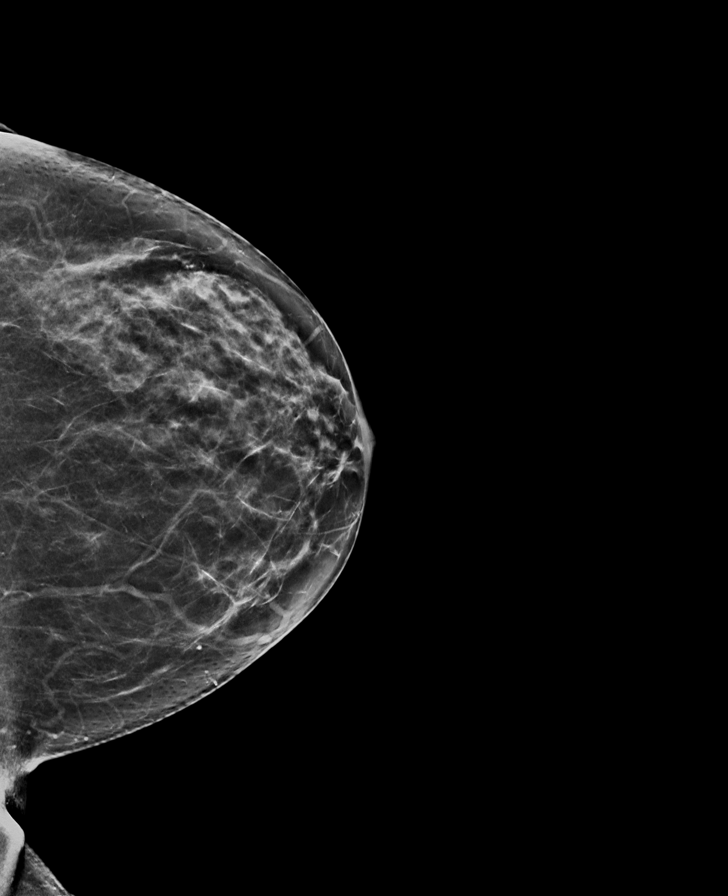

[R CC synth-2D]
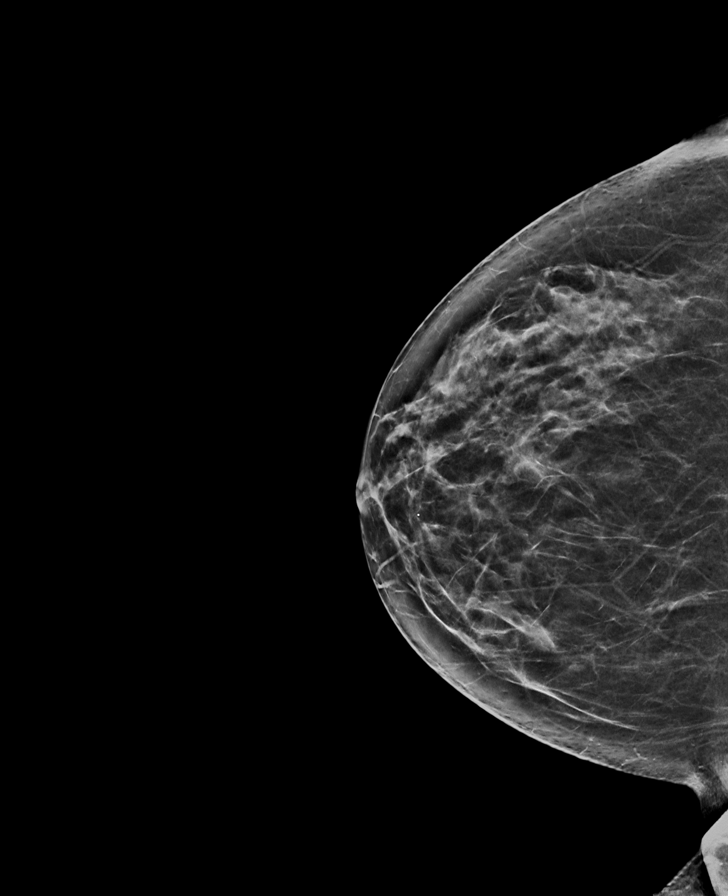

[L CC]
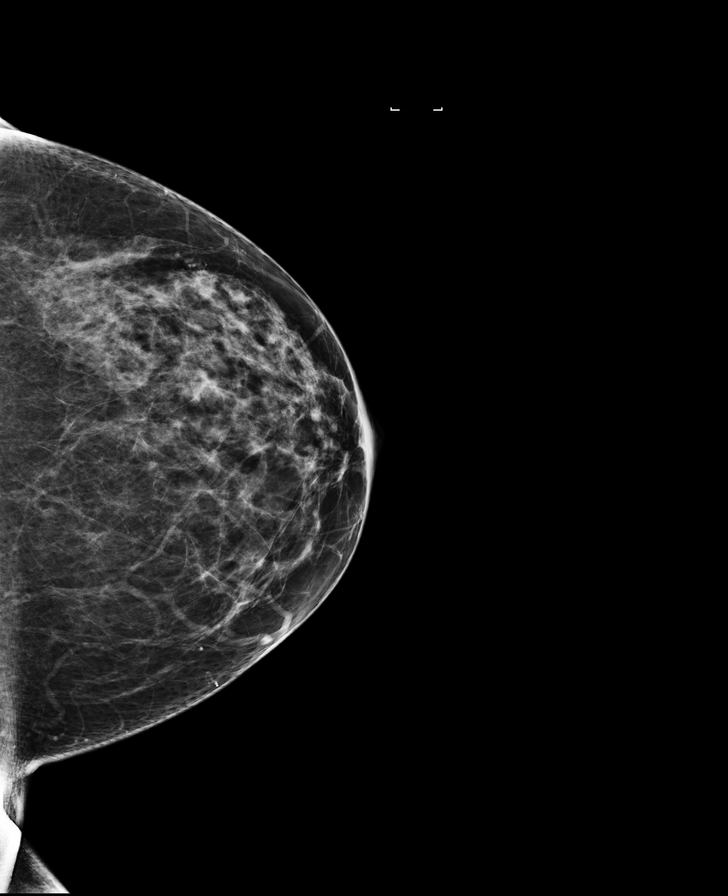

[L MLO synth-2D]
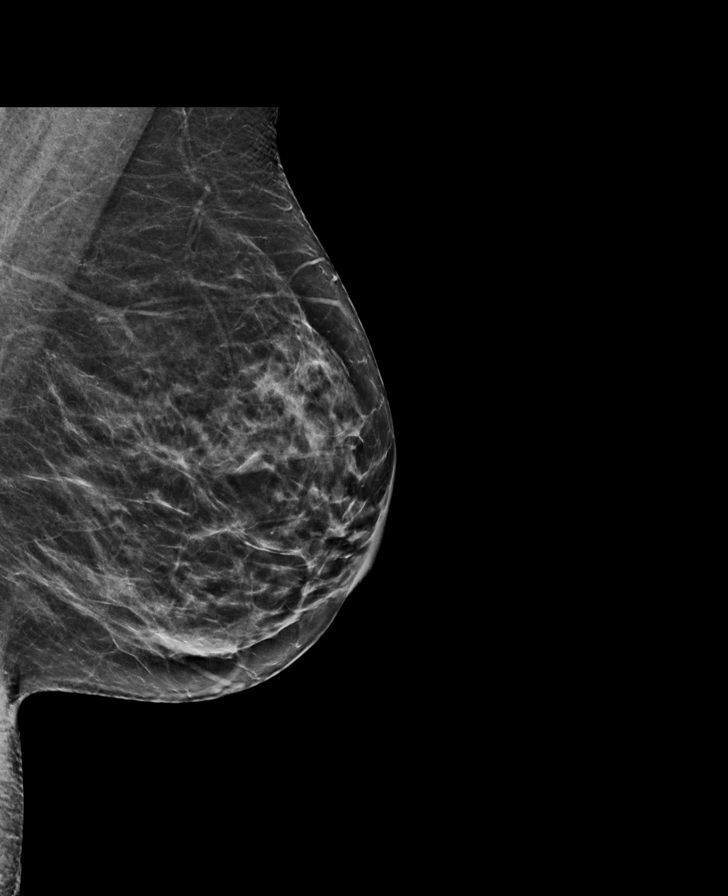

[R MLO synth-2D]
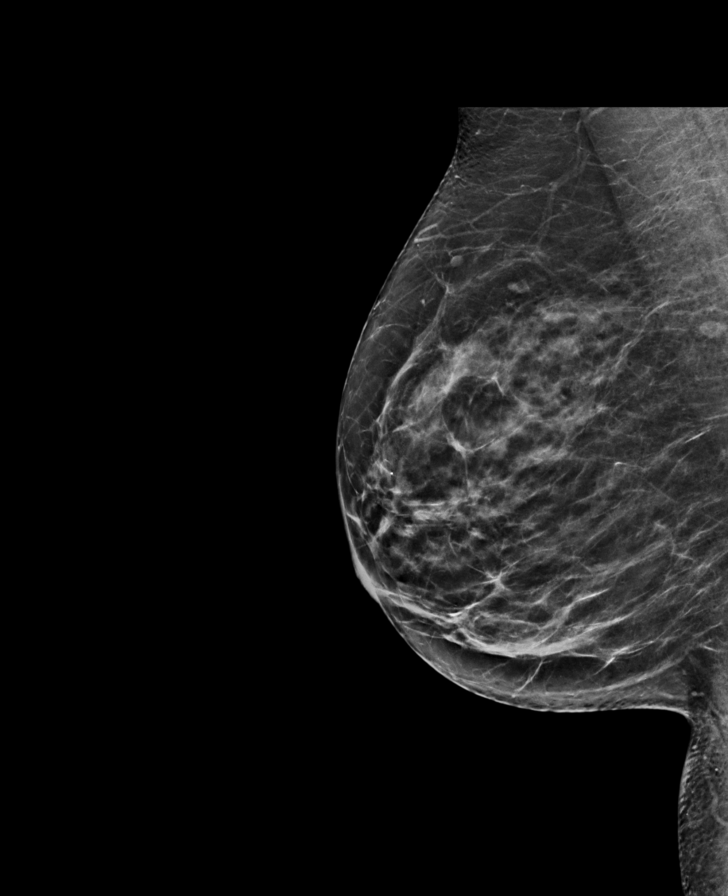

[R MLO]
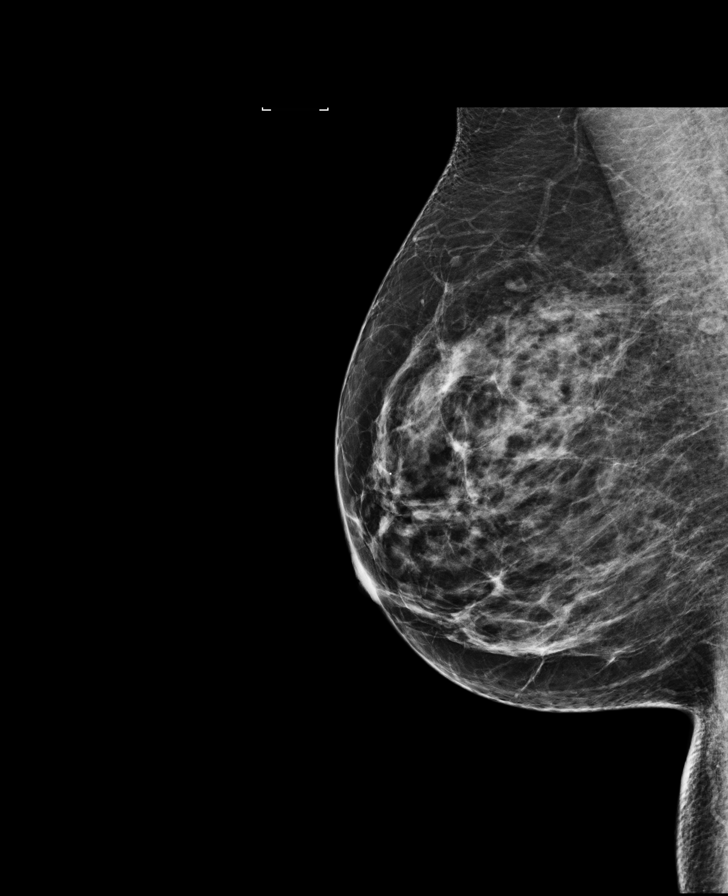

[8 of 28 positions shown; findings below may reference images not displayed]

ACR Breast Density Category c: The breast tissue is heterogeneously
dense, which may obscure small masses.
FINDINGS: There are no findings suspicious for malignancy. Images were
processed with CAD.
IMPRESSION: No mammographic evidence of malignancy. A result letter of this
screening mammogram will be mailed directly to the patient.

RECOMMENDATION:
Screening mammogram in one year. (Code:[id number])

BI-RADS CATEGORY  1: Negative.

## 2018-08-28 ENCOUNTER — Ambulatory Visit: Payer: 59 | Admitting: Internal Medicine

## 2018-09-04 ENCOUNTER — Other Ambulatory Visit: Payer: Self-pay | Admitting: Internal Medicine

## 2018-10-01 ENCOUNTER — Other Ambulatory Visit: Payer: Self-pay | Admitting: Internal Medicine

## 2018-11-04 ENCOUNTER — Other Ambulatory Visit: Payer: Self-pay | Admitting: Internal Medicine

## 2018-11-05 NOTE — Telephone Encounter (Signed)
Refilled: 12/17/20119 Last OV: 07/31/2018 Next OV: 01/31/2019

## 2019-01-09 ENCOUNTER — Other Ambulatory Visit: Payer: Self-pay | Admitting: Internal Medicine

## 2019-01-09 DIAGNOSIS — F419 Anxiety disorder, unspecified: Secondary | ICD-10-CM

## 2019-01-09 NOTE — Telephone Encounter (Signed)
Refilled: 08/20/2018 Last OV: 07/31/2018 Next OV: 01/31/2019

## 2019-01-10 ENCOUNTER — Telehealth: Payer: Self-pay

## 2019-01-10 NOTE — Telephone Encounter (Signed)
PA for omeprazole has been submitted on covermymeds.

## 2019-01-15 ENCOUNTER — Other Ambulatory Visit: Payer: Self-pay | Admitting: Internal Medicine

## 2019-01-15 DIAGNOSIS — Z1231 Encounter for screening mammogram for malignant neoplasm of breast: Secondary | ICD-10-CM

## 2019-01-30 DIAGNOSIS — K219 Gastro-esophageal reflux disease without esophagitis: Secondary | ICD-10-CM

## 2019-01-31 ENCOUNTER — Encounter: Payer: 59 | Admitting: Internal Medicine

## 2019-02-03 NOTE — Telephone Encounter (Signed)
PA has been approved through 01/10/2020.

## 2019-02-05 ENCOUNTER — Other Ambulatory Visit: Payer: Self-pay | Admitting: Internal Medicine

## 2019-02-05 DIAGNOSIS — K5904 Chronic idiopathic constipation: Secondary | ICD-10-CM

## 2019-02-05 DIAGNOSIS — F419 Anxiety disorder, unspecified: Secondary | ICD-10-CM

## 2019-02-05 NOTE — Telephone Encounter (Signed)
Refilled: 01/09/2019 Last OV: 07/31/2018 Next OV: 03/20/2019

## 2019-02-14 ENCOUNTER — Ambulatory Visit
Admission: RE | Admit: 2019-02-14 | Discharge: 2019-02-14 | Disposition: A | Discharge status: Home / Self Care | Payer: 59 | Source: Ambulatory Visit | Attending: Internal Medicine | Admitting: Internal Medicine

## 2019-02-14 DIAGNOSIS — Z1231 Encounter for screening mammogram for malignant neoplasm of breast: Secondary | ICD-10-CM | POA: Diagnosis not present

## 2019-02-17 ENCOUNTER — Ambulatory Visit (INDEPENDENT_AMBULATORY_CARE_PROVIDER_SITE_OTHER): Payer: 59 | Admitting: Internal Medicine

## 2019-02-17 ENCOUNTER — Ambulatory Visit: Payer: Self-pay | Admitting: *Deleted

## 2019-02-17 ENCOUNTER — Encounter: Payer: Self-pay | Admitting: Internal Medicine

## 2019-02-17 ENCOUNTER — Other Ambulatory Visit: Payer: Self-pay

## 2019-02-17 VITALS — Ht 69.0 in | Wt 184.0 lb

## 2019-02-17 DIAGNOSIS — E785 Hyperlipidemia, unspecified: Secondary | ICD-10-CM | POA: Diagnosis not present

## 2019-02-17 DIAGNOSIS — J988 Other specified respiratory disorders: Secondary | ICD-10-CM

## 2019-02-17 DIAGNOSIS — K5909 Other constipation: Secondary | ICD-10-CM

## 2019-02-17 DIAGNOSIS — R69 Illness, unspecified: Secondary | ICD-10-CM | POA: Diagnosis not present

## 2019-02-17 DIAGNOSIS — Z20822 Contact with and (suspected) exposure to covid-19: Secondary | ICD-10-CM

## 2019-02-17 DIAGNOSIS — R6889 Other general symptoms and signs: Secondary | ICD-10-CM | POA: Diagnosis not present

## 2019-02-17 DIAGNOSIS — R7303 Prediabetes: Secondary | ICD-10-CM

## 2019-02-17 DIAGNOSIS — Z113 Encounter for screening for infections with a predominantly sexual mode of transmission: Secondary | ICD-10-CM

## 2019-02-17 NOTE — Progress Notes (Signed)
Virtual Visit via [email or web address]  This visit type was conducted due to national recommendations for restrictions regarding the COVID-19 pandemic (e.g. social distancing).  This format is felt to be most appropriate for this patient at this time.  All issues noted in this document were discussed and addressed.  No physical exam was performed (except for noted visual exam findings with Video Visits).   I connected with@ on 02/17/19 at  9:30 AM EDT by a video enabled telemedicine applicationand verified that I am speaking with the correct person using two identifiers. Location patient: home Location provider: work or home office Persons participating in the virtual visit: patient, provider  I discussed the limitations, risks, security and privacy concerns of performing an evaluation and management service by telephone and the availability of in person appointments. I also discussed with the patient that there may be a patient responsible charge related to this service. The patient expressed understanding and agreed to proceed.  Reason for visit: productive cough, sinus pressure,  Sore throat and sneezing  For the past 3 days  With new onset chest tightness   HPI:  53 yr old female with history of allergic rhinitis.  Prediabetes , hypertension and anxiety presents after symptoms of sinusitis which started on Friday became accompanied by chest tightness .  productive cough started Friday evening acc'd by sinus drainage and sore throat.   Has been sneezing a lot.  No fevers,  Body aches,  Anosmia,  Sore throat now resolved,  But started feeling chest tightness today.   No known COVID 19 exposure.  Has been cleaning out several closed buildings for the last several weeks , lots of mildew smell. Carried several boxes of water yesterday    ROS: See pertinent positives and negatives per HPI.  Past Medical History:  Diagnosis Date  . Cancer [facility])    as a child  . Chronic constipation   . Hypertension      Past Surgical History:  Procedure Laterality Date  . CESAREAN SECTION  1992  . CHOLECYSTECTOMY  Dec 2012   Sankar  . COLONOSCOPY    . [place]  . LYMPH NODE BIOPSY    . NASAL SINUS SURGERY      Family History  Problem Relation Age of Onset  . Arrhythmia Mother   . Hypertension Mother   . Colon polyps Mother        s/p colectomy   . Kidney disease Mother        CKD Stage 4  . Uterine cancer Mother   . Hypertension Father   . Diabetes Father   . Hypertension Brother   . Breast cancer Neg Hx     SOCIAL HX:  reports that she has never smoked. She has never used smokeless tobacco. She reports that she does not drink alcohol or use drugs.   Current Outpatient Medications:  .  ALPRAZolam (XANAX) 0.5 MG tablet, TAKE TWO TABLETS BY MOUTH DAILY AS NEEDED FOR ANXIETY, Disp: 60 tablet, Rfl: 0 .  amitriptyline (ELAVIL) 100 MG tablet, TAKE ONE TABLET BY MOUTH EVERY NIGHT AT BEDTIME, Disp: 90 tablet, Rfl: 1 .  cetirizine (ZYRTEC) 10 MG tablet, Take 10 mg by mouth daily.  , Disp: , Rfl:  .  cholecalciferol (VITAMIN D) 1000 units tablet, Take 1,000 Units by mouth daily., Disp: , Rfl:  .  docusate sodium (COLACE) 100 MG capsule, Take 100 mg by mouth 2 (two) times daily., Disp: , Rfl:  .  Lactobacillus (  PROBIOTIC ACIDOPHILUS PO), Take 1 tablet by mouth daily., Disp: , Rfl:  .  LINZESS 290 MCG CAPS capsule, TAKE ONE CAPSULE BY MOUTH EVERY MORNING BEFORE MEALS, Disp: 30 capsule, Rfl: 4 .  losartan (COZAAR) 100 MG tablet, TAKE ONE TABLET BY MOUTH DAILY, Disp: 90 tablet, Rfl: 1 .  omeprazole (PRILOSEC) 40 MG capsule, TAKE ONE CAPSULE BY MOUTH DAILY, Disp: 90 capsule, Rfl: 1 .  spironolactone (ALDACTONE) 25 MG tablet, TAKE ONE TABLET BY MOUTH DAILY, Disp: 90 tablet, Rfl: 1 .  zolpidem (AMBIEN CR) 12.5 MG CR tablet, TAKE ONE TABLET BY MOUTH EVERY NIGHT AT BEDTIME AS NEEDED FOR SLEEP, Disp: 30 tablet, Rfl: 3  EXAM:  VITALS per patient if applicable:  GENERAL: alert,  oriented, appears well and in no acute distress  HEENT: atraumatic, conjunttiva clear, no obvious abnormalities on inspection of external nose and ears  NECK: normal movements of the head and neck  LUNGS: on inspection no signs of respiratory distress, breathing rate appears normal, no obvious gross SOB, gasping or wheezing  CV: no obvious cyanosis  MS: moves all visible extremities without noticeable abnormality  PSYCH/NEURO: pleasant and cooperative, no obvious depression or anxiety, speech and thought processing grossly intact  ASSESSMENT AND PLAN:  Discussed the following assessment and plan:  Respiratory infection - Plan: CANCELED: Novel Coronavirus, NAA ([facility]), CANCELED: Novel Coronavirus, NAA ([facility])  Hyperlipidemia LDL goal <130 - Plan: TSH, Lipid panel  Prediabetes - Plan: Hemoglobin A1c, Comprehensive metabolic panel  Chronic constipation  Screen for STD (sexually transmitted disease) - Plan: Hepatitis C antibody, HIV Antibody (routine testing w rflx)  Respiratory infection Sending for COVID 10 testing.  Low probability based on lack of known exposure,  Advised to isolate from elderly mother until results are known.  Continue sinus irrigation , decongestants.     I discussed the assessment and treatment plan with the patient. The patient was provided an opportunity to ask questions and all were answered. The patient agreed with the plan and demonstrated an understanding of the instructions.   The patient was advised to call back or seek an in-person evaluation if the symptoms worsen or if the condition fails to improve as anticipated.  I provided 15 minutes of non-face-to-face time during this encounter.   Crecencio Mc, MD

## 2019-02-17 NOTE — Telephone Encounter (Signed)
Patient was scheduled a virtual visit w/ Dr. Derrel Nip for today @ 9:30 am.

## 2019-02-17 NOTE — Telephone Encounter (Signed)
Pt called stating that she developed a cough on 02/14/2019; on 9/12 she had productive cough,and 9/13 she had chest tightness which started 9/13/202 pm; the pt also says that she had a sore throat on 02/14/2019 and 02/15/2019 she had multiple sinus surgeries; the pt says that she is still having the drainage in her throat;  She says that other than the chest tightness it feels like her seasonal allergies; her sputum is milky colored; the pt has been moving out of a building, and has been more physically active due to lifting; she had previously been taking Tylenol; she rates her level of discomfort at 1 out of 10; the pt would like to know if she should be tested for COVID because she has high anxiety because her symptoms are consistent with this; she denies exposure or travel; explained that there are community testing sites that do not require an MD's order but based on her symptoms she should be evaluated; recommendations also made per nurse triage protocol; she verbalized understanding; she sees Dr Derrel Nip, [facility]; pt transferred to [facility] for scheduling.   Reason for Disposition . [1] MILD difficulty breathing (e.g., minimal/no SOB at rest, SOB with walking, pulse <100) AND [2] NEW-onset or WORSE than normal  Answer Assessment - Initial Assessment Questions 1. RESPIRATORY STATUS: "Describe your breathing?" (e.g., wheezing, shortness of breath, unable to speak, severe coughing)      Chest tightness 2. ONSET: "When did this breathing problem begin?"     02/16/2019 3. PATTERN "Does the difficult breathing come and go, or has it been constant since it started?"      constant 4. SEVERITY: "How bad is your breathing?" (e.g., mild, moderate, severe)    - MILD: No SOB at rest, mild SOB with walking, speaks normally in sentences, can lay down, no retractions, pulse < 100.    - MODERATE: SOB at rest, SOB with minimal exertion and prefers to sit, cannot lie down flat, speaks in phrases, mild retractions,  audible wheezing, pulse 100-120.    - SEVERE: Very SOB at rest, speaks in single words, struggling to breathe, sitting hunched forward, retractions, pulse > 120      mild 5. RECURRENT SYMPTOM: "Have you had difficulty breathing before?" If so, ask: "When was the last time?" and "What happened that time?"    no 6. CARDIAC HISTORY: "Do you have any history of heart disease?" (e.g., heart attack, angina, bypass surgery, angioplasty)     High blood pressure 7. LUNG HISTORY: "Do you have any history of lung disease?"  (e.g., pulmonary embolus, asthma, emphysema)   no 8. CAUSE: "What do you think is causing the breathing problem?"      Not sure 9. OTHER SYMPTOMS: "Do you have any other symptoms? (e.g., dizziness, runny nose, cough, chest pain, fever)    Productive cough 10. PREGNANCY: "Is there any chance you are pregnant?" "When was your last menstrual period?"       No menopause 11. TRAVEL: "Have you traveled out of the country in the last month?" (e.g., travel history, exposures) no  Protocols used: BREATHING DIFFICULTY-A-AH

## 2019-02-17 NOTE — Assessment & Plan Note (Signed)
Sending for COVID 10 testing.  Low probability based on lack of known exposure,  Advised to isolate from elderly mother until results are known.  Continue sinus irrigation , decongestants.

## 2019-02-18 LAB — NOVEL CORONAVIRUS, NAA: SARS-CoV-2, NAA: NOT DETECTED

## 2019-02-20 ENCOUNTER — Telehealth: Payer: Self-pay | Admitting: Internal Medicine

## 2019-02-20 DIAGNOSIS — J0110 Acute frontal sinusitis, unspecified: Secondary | ICD-10-CM

## 2019-02-20 MED ORDER — BENZONATATE 200 MG PO CAPS: 200.0000 mg | ORAL_CAPSULE | Freq: Three times a day (TID) | ORAL | 1 refills | Status: DC | PRN

## 2019-02-20 MED ORDER — PREDNISONE 10 MG PO TABS: ORAL_TABLET | ORAL | 0 refills | Status: DC

## 2019-02-20 MED ORDER — AMOXICILLIN-POT CLAVULANATE 875-125 MG PO TABS: 1.0000 | ORAL_TABLET | Freq: Two times a day (BID) | ORAL | 0 refills | Status: DC

## 2019-02-20 NOTE — Telephone Encounter (Signed)
Pt stated that when she did her virtual visit on Monday she was told that if she does not feel better in two days to let you know. Pt stated that she is not any better. She stated that she has dry cough at night and is very congested.

## 2019-02-20 NOTE — Telephone Encounter (Signed)
Pt called and stated that she was seen on 02/17/19 for sinus issues. Pt states that she is still not feeling well. She still have a dry cough at night. Very congested in nose and neck. Pt would like to know what she could do. Please advise

## 2019-03-09 ENCOUNTER — Other Ambulatory Visit: Payer: Self-pay | Admitting: Internal Medicine

## 2019-03-09 DIAGNOSIS — F419 Anxiety disorder, unspecified: Secondary | ICD-10-CM

## 2019-03-10 NOTE — Telephone Encounter (Signed)
Refilled: 02/05/2019 Last OV: 02/17/2019 Next OV: 03/20/2019

## 2019-03-18 ENCOUNTER — Other Ambulatory Visit: Payer: Self-pay

## 2019-03-18 ENCOUNTER — Ambulatory Visit: Payer: 59 | Admitting: Internal Medicine

## 2019-03-20 ENCOUNTER — Ambulatory Visit (INDEPENDENT_AMBULATORY_CARE_PROVIDER_SITE_OTHER): Payer: 59 | Admitting: Internal Medicine

## 2019-03-20 ENCOUNTER — Other Ambulatory Visit: Payer: Self-pay

## 2019-03-20 ENCOUNTER — Ambulatory Visit
Admission: RE | Admit: 2019-03-20 | Discharge: 2019-03-20 | Disposition: A | Discharge status: Home / Self Care | Payer: 59 | Source: Ambulatory Visit | Attending: Internal Medicine | Admitting: Internal Medicine

## 2019-03-20 ENCOUNTER — Encounter: Payer: Self-pay | Admitting: Internal Medicine

## 2019-03-20 ENCOUNTER — Ambulatory Visit
Admission: RE | Admit: 2019-03-20 | Discharge: 2019-03-20 | Disposition: A | Discharge status: Home / Self Care | Payer: 59 | Attending: Internal Medicine | Admitting: Internal Medicine

## 2019-03-20 ENCOUNTER — Ambulatory Visit: Payer: 59

## 2019-03-20 VITALS — BP 118/82 | HR 97 | Temp 97.5°F | Resp 15 | Ht 69.0 in | Wt 178.0 lb

## 2019-03-20 DIAGNOSIS — Z8371 Family history of colonic polyps: Secondary | ICD-10-CM

## 2019-03-20 DIAGNOSIS — I10 Essential (primary) hypertension: Secondary | ICD-10-CM | POA: Diagnosis not present

## 2019-03-20 DIAGNOSIS — F419 Anxiety disorder, unspecified: Secondary | ICD-10-CM | POA: Diagnosis not present

## 2019-03-20 DIAGNOSIS — M25562 Pain in left knee: Secondary | ICD-10-CM

## 2019-03-20 DIAGNOSIS — Z0001 Encounter for general adult medical examination with abnormal findings: Secondary | ICD-10-CM

## 2019-03-20 DIAGNOSIS — R7303 Prediabetes: Secondary | ICD-10-CM

## 2019-03-20 DIAGNOSIS — E785 Hyperlipidemia, unspecified: Secondary | ICD-10-CM

## 2019-03-20 DIAGNOSIS — E040 Nontoxic diffuse goiter: Secondary | ICD-10-CM

## 2019-03-20 DIAGNOSIS — F5105 Insomnia due to other mental disorder: Secondary | ICD-10-CM

## 2019-03-20 DIAGNOSIS — R69 Illness, unspecified: Secondary | ICD-10-CM | POA: Diagnosis not present

## 2019-03-20 DIAGNOSIS — E049 Nontoxic goiter, unspecified: Secondary | ICD-10-CM

## 2019-03-20 DIAGNOSIS — S8992XA Unspecified injury of left lower leg, initial encounter: Secondary | ICD-10-CM | POA: Diagnosis not present

## 2019-03-20 DIAGNOSIS — Z113 Encounter for screening for infections with a predominantly sexual mode of transmission: Secondary | ICD-10-CM | POA: Diagnosis not present

## 2019-03-20 LAB — COMPREHENSIVE METABOLIC PANEL
ALT: 16 U/L (ref 0–35)
AST: 15 U/L (ref 0–37)
Albumin: 4.9 g/dL (ref 3.5–5.2)
Alkaline Phosphatase: 71 U/L (ref 39–117)
BUN: 15 mg/dL (ref 6–23)
CO2: 27 mEq/L (ref 19–32)
Calcium: 10 mg/dL (ref 8.4–10.5)
Chloride: 102 mEq/L (ref 96–112)
Creatinine, Ser: 0.8 mg/dL (ref 0.40–1.20)
GFR: 74.38 mL/min (ref >60.00)
Glucose, Bld: 115 mg/dL — ABNORMAL HIGH (ref 70–99)
Potassium: 3.7 mEq/L (ref 3.5–5.1)
Sodium: 137 mEq/L (ref 135–145)
Total Bilirubin: 0.5 mg/dL (ref 0.2–1.2)
Total Protein: 7.6 g/dL (ref 6.0–8.3)

## 2019-03-20 LAB — LIPID PANEL
Cholesterol: 243 mg/dL — ABNORMAL HIGH (ref 0–200)
HDL: 50.5 mg/dL (ref >39.00)
LDL Cholesterol: 159 mg/dL — ABNORMAL HIGH (ref 0–99)
NonHDL: 192.77
Total CHOL/HDL Ratio: 5
Triglycerides: 167 mg/dL — ABNORMAL HIGH (ref 0.0–149.0)
VLDL: 33.4 mg/dL (ref 0.0–40.0)

## 2019-03-20 LAB — TSH: TSH: 0.87 u[IU]/mL (ref 0.35–4.50)

## 2019-03-20 LAB — HEMOGLOBIN A1C: Hgb A1c MFr Bld: 6 % (ref 4.6–6.5)

## 2019-03-20 MED ORDER — DAPSONE 7.5 % EX GEL: 1.0000 "application " | Freq: Every day | CUTANEOUS | 2 refills | Status: DC

## 2019-03-20 NOTE — Progress Notes (Signed)
Patient ID: [name], female    DOB: [birth year]  Age: 53 y.o. MRN: [id number]  The patient is here for annual PREVENTIVE   examination and management of other chronic and acute problems.  Mammogram normal sept 2020 Pap normal with atrophy  2018 Colonoscopy done at [facility] in 2018 by Dawn Griffith  No polyps  5 yr follow up in 2023   Needs annual thyroid ultrasound  For follow up on nodules  The risk factors are reflected in the social history.  The roster of all physicians providing medical care to patient - is listed in the Snapshot section of the chart.  Activities of daily living:  The patient is 100% independent in all ADLs: dressing, toileting, feeding as well as independent mobility  Home safety : The patient has smoke detectors in the home. They wear seatbelts.  There are no firearms at home. There is no violence in the home.   There is no risks for hepatitis, STDs or HIV. There is no   history of blood transfusion. They have no travel history to infectious disease endemic areas of the world.  The patient has seen their dentist in the last six month. They have seen their eye doctor in the last year. They deny any hearing difficulties and have deferred audiologic testing in the last year.  They do not  have excessive sun exposure. Discussed the need for sun protection: hats, long sleeves and use of sunscreen if there is significant sun exposure.   Diet: the importance of a healthy diet is discussed. She follows a healthy diet 100% of the time and is intentionally losing weight .  The benefits of regular aerobic exercise were discussed. She walks 5 times per week ,  60 minutes.   Depression screen: there are no signs or vegative symptoms of depression- irritability, change in appetite, anhedonia, sadness/tearfullness.   However She has been very frustrated and at times anxious due to dysfunction within the family business resulting in her shouldering most of the physical and  administrative responsibilities due to her father's lapses in judgement and her brother's lack of work Psychologist, forensic.   The following portions of the patient's history were reviewed and updated as appropriate: allergies, current medications, past family history, past medical history,  past surgical history, past social history  and problem list.  Visual acuity was not assessed per patient preference since she has regular follow up with her ophthalmologist. Hearing and body mass index were assessed and reviewed.   During the course of the visit the patient was educated and counseled about appropriate screening and preventive services including : fall prevention , diabetes screening, nutrition counseling, colorectal cancer screening, and recommended immunizations.    CC: The primary encounter diagnosis was Encounter for general adult medical examination with abnormal findings. Diagnoses of Patellar pain, left, Hyperlipidemia LDL goal <130, Screen for STD (sexually transmitted disease), Prediabetes, Essential hypertension, benign, Goiter diffuse, Insomnia secondary to anxiety, Family history of FAP (familial adenomatous polyposis), and Anxiety were also pertinent to this visit.  Anxious, angry emotional.  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  She has been doing a lot of physical labor , moving heavy pieces of furniture while brother sits at the office and ignores his responsibilities .    Husband not getting disability . Financial stressors noted  HTN: Patient is taking her medications as prescribed and notes no  adverse effects.  Home BP readings have been done about once per week and are  generally < 130/80 .  She is avoiding added salt in her diet and walking regularly about 5 times per week for exercise  .  Patient had a fall at work ,  Tripped on a rope and stuck left knee on cement 10 days ago . Anterior knee became swollen  and bruised.  Still very tender to direct pressure and notes some pain with weight bearing/extension   Goiter: patient notes her goiter has enlarged and she has had occasional globus sensation. She is requesting reperat ultrasound   [name] has a past medical history of Cancer (Dawn Griffith), Chronic constipation, and Hypertension.   She has a past surgical history that includes Cesarean section (1992); Ectopic pregnancy surgery (1997); Lymph node biopsy; Nasal sinus surgery; Colonoscopy; and Cholecystectomy (Dec 2012).   Her family history includes Arrhythmia in her mother; Colon polyps in her mother; Diabetes in her father; Hypertension in her brother, father, and mother; Kidney disease in her mother; Uterine cancer in her mother.She reports that she has never smoked. She has never used smokeless tobacco. She reports that she does not drink alcohol or use drugs.  Outpatient Medications Prior to Visit  Medication Sig Dispense Refill  . ALPRAZolam (XANAX) 0.5 MG tablet TAKE TWO TABLETS BY MOUTH DAILY AS NEEDED FOR ANXIETY 60 tablet 0  . amitriptyline (ELAVIL) 100 MG tablet TAKE ONE TABLET BY MOUTH EVERY NIGHT AT BEDTIME 90 tablet 1  . cetirizine (ZYRTEC) 10 MG tablet Take 10 mg by mouth daily.      . cholecalciferol (VITAMIN D) 1000 units tablet Take 1,000 Units by mouth daily.    Marland Kitchen docusate sodium (COLACE) 100 MG capsule Take 100 mg by mouth 2 (two) times daily.    . Lactobacillus (PROBIOTIC ACIDOPHILUS PO) Take 1 tablet by mouth daily.    Marland Kitchen LINZESS 290 MCG CAPS capsule TAKE ONE CAPSULE BY MOUTH EVERY MORNING BEFORE MEALS 30 capsule 4  . losartan (COZAAR) 100 MG tablet TAKE ONE TABLET BY MOUTH DAILY 90 tablet 1  . omeprazole (PRILOSEC) 40 MG capsule TAKE ONE CAPSULE BY MOUTH DAILY 90 capsule 1  . spironolactone (ALDACTONE) 25 MG tablet TAKE ONE TABLET BY MOUTH DAILY 90 tablet 1  . zolpidem (AMBIEN CR) 12.5 MG CR tablet TAKE ONE TABLET BY MOUTH EVERY NIGHT AT BEDTIME AS NEEDED FOR SLEEP 30  tablet 3  . amoxicillin-clavulanate (AUGMENTIN) 875-125 MG tablet Take 1 tablet by mouth 2 (two) times daily. 14 tablet 0  . benzonatate (TESSALON) 200 MG capsule Take 1 capsule (200 mg total) by mouth 3 (three) times daily as needed for cough. 60 capsule 1  . predniSONE (DELTASONE) 10 MG tablet 6 tablets on Day 1 , then reduce by 1 tablet daily until gone 21 tablet 0   No facility-administered medications prior to visit.     Review of Systems   Patient denies headache, fevers, malaise, unintentional weight loss, skin rash, eye pain, sinus congestion and sinus pain, sore throat, dysphagia,  hemoptysis , cough, dyspnea, wheezing, chest pain, palpitations, orthopnea, edema, abdominal pain, nausea, melena, diarrhea, constipation, flank pain, dysuria, hematuria, urinary  Frequency, nocturia, numbness, tingling, seizures,  Focal weakness, Loss of consciousness,  Tremor, depression,  and suicidal ideation.      Objective:  BP 118/82 (BP Location: Left Arm, Patient Position: Sitting, Cuff Size: Normal)   Pulse 97   Temp (!) 97.5 F (36.4 C) (Temporal)   Resp  15   Ht 5\' 9"  (1.753 m)   Wt 178 lb (80.7 kg)   LMP 05/01/2014 (Approximate)   SpO2 97%   BMI 26.29 kg/m   Physical Exam  General appearance: alert, cooperative and appears stated age Head: Normocephalic, without obvious abnormality, atraumatic Eyes: conjunctivae/corneas clear. PERRL, EOM's intact. Fundi benign. Ears: normal TM's and external ear canals both ears Nose: Nares normal. Septum midline. Mucosa normal. No drainage or sinus tenderness. Throat: lips, mucosa, and tongue normal; teeth and gums normal Neck: no adenopathy, no carotid bruit, no JVD, supple, symmetrical, trachea midline.  thyroid diffusely enlarged, symmetric, no tenderness/mass/nodules Lungs: clear to auscultation bilaterally Breasts: normal appearance, no masses or tenderness Heart: regular rate and rhythm, S1, S2 normal, no murmur, click, rub or  gallop Abdomen: soft, non-tender; bowel sounds normal; no masses,  no organomegaly Extremities: extremities normal, atraumatic, no cyanosis  MS: left knee tender to palpation on patellar surface.  Mild effusion . No bruising or discoloration  Pulses: 2+ and symmetric Skin: Skin color, texture, turgor normal. No rashes or lesions Neurologic: Alert and oriented X 3, normal strength and tone. Normal symmetric reflexes. Normal coordination and gait.     Assessment & Plan:   Problem List Items Addressed This Visit      Unprioritized   Hyperlipidemia LDL goal <130   Anxiety    Managed with prn alprazolam,  And ambien/elavil for insomnia secondary to anxiety.  The risks and benefits of benzodiazepine use were reviewed with patient today including excessive sedation leading to respiratory depression,  impaired thinking/driving, and addiction.  Patient was advised to avoid concurrent use with alcohol, to use medication only as needed and not to share with others        Encounter for general adult medical examination with abnormal findings - Primary    age appropriate education and counseling updated, referrals for preventative services and immunizations addressed, dietary and smoking counseling addressed, most recent labs reviewed.  I have personally reviewed and have noted:  1) the patient's medical and social history 2) The pt's use of alcohol, tobacco, and illicit drugs 3) The patient's current medications and supplements 4) Functional ability including ADL's, fall risk, home safety risk, hearing and visual impairment 5) Diet and physical activities 6) Evidence for depression or mood disorder 7) The patient's height, weight, and BMI have been recorded in the chart  I have made referrals, and provided counseling and education based on review of the above, and ordering imaging of her swollen left knee to rule out patellar fracture       Essential hypertension, benign    Well controlled on  current regimen. Renal function stable, no changes today.  Lab Results  Component Value Date   CREATININE 0.80 03/20/2019   Lab Results  Component Value Date   NA 137 03/20/2019   K 3.7 03/20/2019   CL 102 03/20/2019   CO2 27 03/20/2019         Goiter diffuse    Last ultrasound was unchanged , no nodules August 2019. However she feels that her goiter has enlarged and has had occasional globus sensation and is requesting repeat US. Thyroid function is again normal.  Lab Results  Component Value Date   TSH 0.87 03/20/2019         Relevant Orders   US THYROID   Family history of FAP (familial adenomatous polyposis)    She is up to date on 5 yr screening intervals,  Which is due again in  2023      Prediabetes    She has lowered her A1c with  A low  glycemic index diet and has intentionally lost weight owth regular participation in an aerobic activity.  We will recheck an A1c in 6 months.   Lab Results  Component Value Date   HGBA1C 6.0 03/20/2019         Insomnia secondary to anxiety    Managed with elavil and ambien.  No changes today.  The risks and benefits of ambien use  were discussed with patient today including excessive sedation leading to respiratory depression,  impaired thinking/driving, and dependence .  Patient was advised to avoid concurrent use with alcohol, to use medication only as needed and not to share with others  .       Patellar pain, left    Post traumatic,  With bruising and swelling at time of fall 10 days ago.  Plain films done today; no fractures seen. Continue ice , tylenol ,  Walking daily      Relevant Orders   DG Knee 4 Views W/Patella Left (Completed)    Other Visit Diagnoses    Screen for STD (sexually transmitted disease)          I have discontinued Dawn Griffith's predniSONE, amoxicillin-clavulanate, and benzonatate. I am also having her start on Dapsone. Additionally, I am having her maintain her cetirizine, Lactobacillus  (PROBIOTIC ACIDOPHILUS PO), docusate sodium, cholecalciferol, losartan, spironolactone, omeprazole, zolpidem, Linzess, amitriptyline, and ALPRAZolam.  Meds ordered this encounter  Medications  . Dapsone (ACZONE) 7.5 % GEL    Sig: Apply 1 application topically daily. Once daily In a thin layer to entire face    Dispense:  60 g    Refill:  2    Medications Discontinued During This Encounter  Medication Reason  . predniSONE (DELTASONE) 10 MG tablet Completed Course  . amoxicillin-clavulanate (AUGMENTIN) 875-125 MG tablet Completed Course  . benzonatate (TESSALON) 200 MG capsule Completed Course    Follow-up: No follow-ups on file.   Crecencio Mc, MD

## 2019-03-20 NOTE — Patient Instructions (Signed)
please go to [facility] for your knee x rays  Aczone has been refilled   Health Maintenance for Postmenopausal Women Menopause is a normal process in which your ability to get pregnant comes to an end. This process happens slowly over many months or years, usually between the ages of 15 and 71. Menopause is complete when you have missed your menstrual periods for 12 months. It is important to talk with your health care provider about some of the most common conditions that affect women after menopause (postmenopausal women). These include heart disease, cancer, and bone loss (osteoporosis). Adopting a healthy lifestyle and getting preventive care can help to promote your health and wellness. The actions you take can also lower your chances of developing some of these common conditions. What should I know about menopause? During menopause, you may get a number of symptoms, such as:  Hot flashes. These can be moderate or severe.  Night sweats.  Decrease in sex drive.  Mood swings.  Headaches.  Tiredness.  Irritability.  Memory problems.  Insomnia. Choosing to treat or not to treat these symptoms is a decision that you make with your health care provider. Do I need hormone replacement therapy?  Hormone replacement therapy is effective in treating symptoms that are caused by menopause, such as hot flashes and night sweats.  Hormone replacement carries certain risks, especially as you become older. If you are thinking about using estrogen or estrogen with progestin, discuss the benefits and risks with your health care provider. What is my risk for heart disease and stroke? The risk of heart disease, heart attack, and stroke increases as you age. One of the causes may be a change in the body's hormones during menopause. This can affect how your body uses dietary fats, triglycerides, and cholesterol. Heart attack and stroke are medical emergencies. There are many things that you can do to help  prevent heart disease and stroke. Watch your blood pressure  High blood pressure causes heart disease and increases the risk of stroke. This is more likely to develop in people who have high blood pressure readings, are of African descent, or are overweight.  Have your blood pressure checked: ? Every 3-5 years if you are 16-30 years of age. ? Every year if you are 60 years old or older. Eat a healthy diet   Eat a diet that includes plenty of vegetables, fruits, low-fat dairy products, and lean protein.  Do not eat a lot of foods that are high in solid fats, added sugars, or sodium. Get regular exercise Get regular exercise. This is one of the most important things you can do for your health. Most adults should:  Try to exercise for at least 150 minutes each week. The exercise should increase your heart rate and make you sweat (moderate-intensity exercise).  Try to do strengthening exercises at least twice each week. Do these in addition to the moderate-intensity exercise.  Spend less time sitting. Even light physical activity can be beneficial. Other tips  Work with your health care provider to achieve or maintain a healthy weight.  Do not use any products that contain nicotine or tobacco, such as cigarettes, e-cigarettes, and chewing tobacco. If you need help quitting, ask your health care provider.  Know your numbers. Ask your health care provider to check your cholesterol and your blood sugar (glucose). Continue to have your blood tested as directed by your health care provider. Do I need screening for cancer? Depending on your health history and  family history, you may need to have cancer screening at different stages of your life. This may include screening for:  Breast cancer.  Cervical cancer.  Lung cancer.  Colorectal cancer. What is my risk for osteoporosis? After menopause, you may be at increased risk for osteoporosis. Osteoporosis is a condition in which bone  destruction happens more quickly than new bone creation. To help prevent osteoporosis or the bone fractures that can happen because of osteoporosis, you may take the following actions:  If you are 71-37 years old, get at least 1,000 mg of calcium and at least 600 mg of vitamin D per day.  If you are older than age 42 but younger than age 2, get at least 1,200 mg of calcium and at least 600 mg of vitamin D per day.  If you are older than age 46, get at least 1,200 mg of calcium and at least 800 mg of vitamin D per day. Smoking and drinking excessive alcohol increase the risk of osteoporosis. Eat foods that are rich in calcium and vitamin D, and do weight-bearing exercises several times each week as directed by your health care provider. How does menopause affect my mental health? Depression may occur at any age, but it is more common as you become older. Common symptoms of depression include:  Low or sad mood.  Changes in sleep patterns.  Changes in appetite or eating patterns.  Feeling an overall lack of motivation or enjoyment of activities that you previously enjoyed.  Frequent crying spells. Talk with your health care provider if you think that you are experiencing depression. General instructions See your health care provider for regular wellness exams and vaccines. This may include:  Scheduling regular health, dental, and eye exams.  Getting and maintaining your vaccines. These include: ? Influenza vaccine. Get this vaccine each year before the flu season begins. ? Pneumonia vaccine. ? Shingles vaccine. ? Tetanus, diphtheria, and pertussis (Tdap) booster vaccine. Your health care provider may also recommend other immunizations. Tell your health care provider if you have ever been abused or do not feel safe at home. Summary  Menopause is a normal process in which your ability to get pregnant comes to an end.  This condition causes hot flashes, night sweats, decreased  interest in sex, mood swings, headaches, or lack of sleep.  Treatment for this condition may include hormone replacement therapy.  Take actions to keep yourself healthy, including exercising regularly, eating a healthy diet, watching your weight, and checking your blood pressure and blood sugar levels.  Get screened for cancer and depression. Make sure that you are up to date with all your vaccines. This information is not intended to replace advice given to you by your health care provider. Make sure you discuss any questions you have with your health care provider. Document Released: 07/14/2005 Document Revised: 05/15/2018 Document Reviewed: 05/15/2018 Elsevier Patient Education  2020 [employer].

## 2019-03-21 LAB — HIV ANTIBODY (ROUTINE TESTING W REFLEX): HIV 1&2 Ab, 4th Generation: NONREACTIVE

## 2019-03-21 LAB — HEPATITIS C ANTIBODY
Hepatitis C Ab: NONREACTIVE
SIGNAL TO CUT-OFF: 0.01 (ref <1.00)

## 2019-03-22 DIAGNOSIS — M25562 Pain in left knee: Secondary | ICD-10-CM | POA: Insufficient documentation

## 2019-03-22 NOTE — Assessment & Plan Note (Signed)
She is up to date on 5 yr screening intervals,  Which is due again in 2023

## 2019-03-22 NOTE — Assessment & Plan Note (Signed)
Managed with elavil and ambien.  No changes today.  The risks and benefits of ambien use  were discussed with patient today including excessive sedation leading to respiratory depression,  impaired thinking/driving, and dependence .  Patient was advised to avoid concurrent use with alcohol, to use medication only as needed and not to share with others  .  

## 2019-03-22 NOTE — Assessment & Plan Note (Signed)
Well controlled on current regimen. Renal function stable, no changes today.  Lab Results  Component Value Date   CREATININE 0.80 03/20/2019   Lab Results  Component Value Date   NA 137 03/20/2019   K 3.7 03/20/2019   CL 102 03/20/2019   CO2 27 03/20/2019

## 2019-03-22 NOTE — Assessment & Plan Note (Signed)
Last ultrasound was unchanged , no nodules August 2019. However she feels that her goiter has enlarged and has had occasional globus sensation and is requesting repeat US. Thyroid function is again normal.  Lab Results  Component Value Date   TSH 0.87 03/20/2019

## 2019-03-22 NOTE — Assessment & Plan Note (Signed)
She has lowered her A1c with  A low  glycemic index diet and has intentionally lost weight owth regular participation in an aerobic activity.  We will recheck an A1c in 6 months.   Lab Results  Component Value Date   HGBA1C 6.0 03/20/2019

## 2019-03-22 NOTE — Assessment & Plan Note (Addendum)
age appropriate education and counseling updated, referrals for preventative services and immunizations addressed, dietary and smoking counseling addressed, most recent labs reviewed.  I have personally reviewed and have noted:  1) the patient's medical and social history 2) The pt's use of alcohol, tobacco, and illicit drugs 3) The patient's current medications and supplements 4) Functional ability including ADL's, fall risk, home safety risk, hearing and visual impairment 5) Diet and physical activities 6) Evidence for depression or mood disorder 7) The patient's height, weight, and BMI have been recorded in the chart  I have made referrals, and provided counseling and education based on review of the above, and ordering imaging of her swollen left knee to rule out patellar fracture

## 2019-03-22 NOTE — Assessment & Plan Note (Signed)
Post traumatic,  With bruising and swelling at time of fall 10 days ago.  Plain films done today; no fractures seen. Continue ice , tylenol ,  Walking daily

## 2019-03-22 NOTE — Assessment & Plan Note (Signed)
Managed with prn alprazolam,  And ambien/elavil for insomnia secondary to anxiety.  The risks and benefits of benzodiazepine use were reviewed with patient today including excessive sedation leading to respiratory depression,  impaired thinking/driving, and addiction.  Patient was advised to avoid concurrent use with alcohol, to use medication only as needed and not to share with others

## 2019-04-03 ENCOUNTER — Ambulatory Visit
Admission: RE | Admit: 2019-04-03 | Discharge: 2019-04-03 | Disposition: A | Discharge status: Home / Self Care | Payer: 59 | Source: Ambulatory Visit | Attending: Internal Medicine | Admitting: Internal Medicine

## 2019-04-03 ENCOUNTER — Other Ambulatory Visit: Payer: Self-pay

## 2019-04-03 DIAGNOSIS — E040 Nontoxic diffuse goiter: Secondary | ICD-10-CM

## 2019-04-03 DIAGNOSIS — E049 Nontoxic goiter, unspecified: Secondary | ICD-10-CM

## 2019-04-03 DIAGNOSIS — E010 Iodine-deficiency related diffuse (endemic) goiter: Secondary | ICD-10-CM | POA: Diagnosis not present

## 2019-04-07 ENCOUNTER — Other Ambulatory Visit: Payer: Self-pay | Admitting: Internal Medicine

## 2019-04-07 DIAGNOSIS — F419 Anxiety disorder, unspecified: Secondary | ICD-10-CM

## 2019-04-07 NOTE — Telephone Encounter (Signed)
Refilled: 03/10/2019 Last OV: 03/20/2019 Next OV: not scheduled

## 2019-04-08 ENCOUNTER — Other Ambulatory Visit: Payer: Self-pay | Admitting: Internal Medicine

## 2019-04-08 DIAGNOSIS — F419 Anxiety disorder, unspecified: Secondary | ICD-10-CM

## 2019-04-08 MED ORDER — ALPRAZOLAM 0.5 MG PO TABS: ORAL_TABLET | ORAL | 4 refills | Status: DC

## 2019-04-09 ENCOUNTER — Other Ambulatory Visit: Payer: Self-pay | Admitting: Internal Medicine

## 2019-05-13 ENCOUNTER — Ambulatory Visit: Payer: 59 | Admitting: Family Medicine

## 2019-06-02 ENCOUNTER — Other Ambulatory Visit: Payer: Self-pay | Admitting: Internal Medicine

## 2019-06-03 ENCOUNTER — Encounter: Payer: 59 | Admitting: Obstetrics and Gynecology

## 2019-06-18 ENCOUNTER — Encounter: Payer: Self-pay | Admitting: Obstetrics and Gynecology

## 2019-06-18 ENCOUNTER — Ambulatory Visit (INDEPENDENT_AMBULATORY_CARE_PROVIDER_SITE_OTHER): Payer: No Typology Code available for payment source | Admitting: Obstetrics and Gynecology

## 2019-06-18 ENCOUNTER — Other Ambulatory Visit: Payer: Self-pay

## 2019-06-18 VITALS — BP 141/83 | HR 97 | Ht 69.0 in | Wt 183.2 lb

## 2019-06-18 DIAGNOSIS — Z8049 Family history of malignant neoplasm of other genital organs: Secondary | ICD-10-CM | POA: Diagnosis not present

## 2019-06-18 DIAGNOSIS — Z01419 Encounter for gynecological examination (general) (routine) without abnormal findings: Secondary | ICD-10-CM

## 2019-06-18 DIAGNOSIS — Z8371 Family history of colonic polyps: Secondary | ICD-10-CM | POA: Diagnosis not present

## 2019-06-18 DIAGNOSIS — R7303 Prediabetes: Secondary | ICD-10-CM | POA: Diagnosis not present

## 2019-06-18 DIAGNOSIS — E785 Hyperlipidemia, unspecified: Secondary | ICD-10-CM | POA: Diagnosis not present

## 2019-06-18 DIAGNOSIS — Z86018 Personal history of other benign neoplasm: Secondary | ICD-10-CM | POA: Diagnosis not present

## 2019-06-18 DIAGNOSIS — Z1231 Encounter for screening mammogram for malignant neoplasm of breast: Secondary | ICD-10-CM | POA: Diagnosis not present

## 2019-06-18 NOTE — Progress Notes (Signed)
ANNUAL PREVENTATIVE CARE GYNECOLOGY  ENCOUNTER NOTE  Subjective:       Dawn Griffith is a 54 y.o. [id number] married female here for a routine annual gynecologic exam. She has a history of fibroid uterus. She is transitioning care from [facility], [place].  The patient is not taking hormone replacement therapy. Patient denies post-menopausal vaginal bleeding. The patient wears seatbelts: yes.   Current complaints: 1.  None   Gynecologic History Patient's last menstrual period was 05/01/2014 (approximate). Contraception: post menopausal status Last Pap: 01/2017. Results were: normal Last mammogram: 02/14/2019. Results were: normal Last Colonoscopy: 2018 (per patient).      Obstetric History OB History  Gravida Para Term Preterm AB Living  3 1     2     SAB TAB Ectopic Multiple Live Births  1   1        # Outcome Date GA Lbr Len/2nd Weight Sex Delivery Anes PTL Lv  3 SAB           2 Ectopic           1 Para             Past Medical History:  Diagnosis Date  . Anxiety 12/30/2010  . Cancer [facility])    as a child  . Chronic constipation   . Family history of FAP (familial adenomatous polyposis) 07/07/2014   5 yr interval screening Last colonoscopy 2018  . Fibroid uterus 07/07/2014   Noted during pelvic ultrasound in 2014 for fullness on exam   . Goiter diffuse 03/05/2014   Repeat ultrasound of thyroid is unchanged.    . Hyperlipidemia LDL goal <130 11/07/2013  . Hypertension   . 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 Overweight (BMI 25.0-29.9) 11/04/2012   Goal weight for BMI < 25 based on height of 5'9.5" is < 170 lbs Body mass index is 27.52 kg/m.   Marland Kitchen Prediabetes 01/02/2016  . Vitamin D deficiency 07/04/2016    Family History  Problem Relation Age of Onset  . Arrhythmia Mother   . Hypertension Mother   . Colon polyps Mother        s/p colectomy   . Kidney disease Mother        CKD Stage 4  . Uterine cancer Mother   . Diabetes Mother   . Hypertension Father   .  Diabetes Father   . Hypertension Brother   . Breast cancer Neg Hx     Past Surgical History:  Procedure Laterality Date  . CESAREAN SECTION  1992  . CHOLECYSTECTOMY  Dec 2012   [facility]  . COLONOSCOPY    . [place]  . LYMPH NODE BIOPSY    . NASAL SINUS SURGERY      Social History   Socioeconomic History  . Marital status: Married    Spouse name: [name]  . Number of children: 1  . Years of education: 53  . Highest education level: Not on file  Occupational History  . 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  Tobacco Use  . Smoking status: Never Smoker  . Smokeless tobacco: Never Used  Substance and Sexual Activity  . Alcohol use: No    Alcohol/week: 0.0 standard drinks  . Drug use: No  . Sexual activity: Yes    Birth control/protection: Post-menopausal  Other Topics Concern  . Not on file  Social History Narrative  . Not on file   Social Determinants of Health  Financial Resource Strain:   . Difficulty of Paying Living Expenses: Not on file  Food Insecurity:   . Worried About Charity fundraiser in the Last Year: Not on file  . Ran Out of Food in the Last Year: Not on file  Transportation Needs:   . Lack of Transportation (Medical): Not on file  . Lack of Transportation (Non-Medical): Not on file  Physical Activity:   . Days of Exercise per Week: Not on file  . Minutes of Exercise per Session: Not on file  Stress:   . Feeling of Stress : Not on file  Social Connections:   . Frequency of Communication with Friends and Family: Not on file  . Frequency of Social Gatherings with Friends and Family: Not on file  . Attends Religious Services: Not on file  . Active Member of Clubs or Organizations: Not on file  . Attends Archivist Meetings: Not on file  . Marital Status: Not on file  Intimate Partner Violence:   . Fear of Current or Ex-Partner: Not on file  . Emotionally Abused: Not on file  . Physically  Abused: Not on file  . Sexually Abused: Not on file    Current Outpatient Medications on File Prior to Visit  Medication Sig Dispense Refill  . ALPRAZolam (XANAX) 0.5 MG tablet TAKE TWO TABLETS BY MOUTH DAILY AS NEEDED FOR ANXIETY 60 tablet 4  . amitriptyline (ELAVIL) 100 MG tablet TAKE ONE TABLET BY MOUTH EVERY NIGHT AT BEDTIME 90 tablet 1  . cetirizine (ZYRTEC) 10 MG tablet Take 10 mg by mouth daily.      . cholecalciferol (VITAMIN D) 1000 units tablet Take 1,000 Units by mouth daily.    . Dapsone (ACZONE) 7.5 % GEL Apply 1 application topically daily. Once daily In a thin layer to entire face 60 g 2  . docusate sodium (COLACE) 100 MG capsule Take 100 mg by mouth 2 (two) times daily.    . Lactobacillus (PROBIOTIC ACIDOPHILUS PO) Take 1 tablet by mouth daily.    Marland Kitchen LINZESS 290 MCG CAPS capsule TAKE ONE CAPSULE BY MOUTH EVERY MORNING BEFORE MEALS 30 capsule 4  . losartan (COZAAR) 100 MG tablet TAKE ONE TABLET BY MOUTH DAILY 90 tablet 1  . Multiple Vitamin (MULTI-VITAMIN DAILY PO) Take by mouth.    Marland Kitchen omeprazole (PRILOSEC) 40 MG capsule TAKE ONE CAPSULE BY MOUTH DAILY 90 capsule 3  . spironolactone (ALDACTONE) 25 MG tablet TAKE ONE TABLET BY MOUTH DAILY 90 tablet 1  . ZINC-VITAMIN C PO Take by mouth.    . zolpidem (AMBIEN CR) 12.5 MG CR tablet TAKE ONE TABLET BY MOUTH EVERY NIGHT AT BEDTIME AS NEEDED FOR SLEEP 30 tablet 2   No current facility-administered medications on file prior to visit.    No Known Allergies    Review of Systems ROS Review of Systems - General ROS: negative for - chills, fatigue, fever, hot flashes, night sweats, weight gain or weight loss Psychological ROS: negative for - anxiety, decreased libido, depression, mood swings, physical abuse or sexual abuse Ophthalmic ROS: negative for - blurry vision, eye pain or loss of vision ENT ROS: negative for - headaches, hearing change, visual changes or vocal changes Allergy and Immunology ROS: negative for - hives,  itchy/watery eyes or seasonal allergies Hematological and Lymphatic ROS: negative for - bleeding problems, bruising, swollen lymph nodes or weight loss Endocrine ROS: negative for - galactorrhea, hair pattern changes, hot flashes, malaise/lethargy, mood swings, palpitations, polydipsia/polyuria, skin changes,  temperature intolerance or unexpected weight changes Breast ROS: negative for - new or changing breast lumps or nipple discharge Respiratory ROS: negative for - cough or shortness of breath Cardiovascular ROS: negative for - chest pain, irregular heartbeat, palpitations or shortness of breath Gastrointestinal ROS: no abdominal pain, change in bowel habits, or black or bloody stools Genito-Urinary ROS: no dysuria, trouble voiding, or hematuria Musculoskeletal ROS: negative for - joint pain or joint stiffness Neurological ROS: negative for - bowel and bladder control changes Dermatological ROS: negative for rash and skin lesion changes   Objective:   BP (!) 141/83   Pulse 97   Ht 5\' 9"  (1.753 m)   Wt 183 lb 3.2 oz (83.1 kg)   LMP 05/01/2014 (Approximate)   BMI 27.05 kg/m  CONSTITUTIONAL: Well-developed, well-nourished female in no acute distress. Overweight PSYCHIATRIC: Normal mood and affect. Normal behavior. Normal judgment and thought content. [place]: Alert and oriented to person, place, and time. Normal muscle tone coordination. No cranial nerve deficit noted. HENT:  Normocephalic, atraumatic, External right and left ear normal. Oropharynx is clear and moist EYES: Conjunctivae and EOM are normal. Pupils are equal, round, and reactive to light. No scleral icterus.  NECK: Normal range of motion, supple, no masses.  Normal thyroid.  SKIN: Skin is warm and dry. No rash noted. Not diaphoretic. No erythema. No pallor. CARDIOVASCULAR: Normal heart rate noted, regular rhythm, no murmur. RESPIRATORY: Clear to auscultation bilaterally. Effort and breath sounds normal, no problems with  respiration noted. BREASTS: Symmetric in size. No masses, skin changes, nipple drainage, or lymphadenopathy. ABDOMEN: Soft, normal bowel sounds, no distention noted.  No tenderness, rebound or guarding.  BLADDER: Normal PELVIC:  Bladder no bladder distension noted  Urethra: normal appearing urethra with no masses, tenderness or lesions  Vulva: normal appearing vulva with no masses, tenderness or lesions  Vagina: atrophic. No lesions or discharge  Cervix: normal appearing cervix without discharge or lesions  Uterus: uterus is normal size, shape, consistency and nontender  Adnexa: normal adnexa in size, nontender and no masses  RV: External Exam NormaI, No Rectal Masses and Normal Sphincter tone  MUSCULOSKELETAL: Normal range of motion. No tenderness.  No cyanosis, clubbing, or edema.  2+ distal pulses. LYMPHATIC: No Axillary, Supraclavicular, or Inguinal Adenopathy.   Labs: Lab Results  Component Value Date   WBC 11.9 (H) 08/05/2018   HGB 14.3 08/05/2018   HCT 43.9 08/05/2018   MCV 91.8 08/05/2018   PLT 399 08/05/2018    Lab Results  Component Value Date   CREATININE 0.80 03/20/2019   BUN 15 03/20/2019   NA 137 03/20/2019   K 3.7 03/20/2019   CL 102 03/20/2019   CO2 27 03/20/2019    Lab Results  Component Value Date   ALT 16 03/20/2019   AST 15 03/20/2019   ALKPHOS 71 03/20/2019   BILITOT 0.5 03/20/2019    Lab Results  Component Value Date   CHOL 243 (H) 03/20/2019   HDL 50.50 03/20/2019   LDLCALC 159 (H) 03/20/2019   LDLDIRECT 155.0 01/22/2017   TRIG 167.0 (H) 03/20/2019   CHOLHDL 5 03/20/2019    Lab Results  Component Value Date   TSH 0.87 03/20/2019    Lab Results  Component Value Date   HGBA1C 6.0 03/20/2019     Assessment:   1. Encounter for well woman exam with routine gynecological exam   2. Breast cancer screening by mammogram   3. Dyslipidemia   4. Family history of FAP (familial adenomatous polyposis)  5. Family history of uterine  cancer   6. Prediabetes   7. History of uterine fibroid     Plan:  Pap: Not needed. Up to date.  Mammogram: Not Ordered. Up to date.  Stool Guaiac Testing:  Not Ordered.  Up to date.  Labs: Up to date. Normally performed with PCP.  Routine preventative health maintenance measures emphasized: Exercise/Diet/Weight control, Tobacco Warnings, Alcohol/Substance use risks, Stress Management, Peer Pressure Issues and Safe Sex Up to date on flu vaccine (received in August).  Comorbidities managed by PCP.  Discussion had again regarding patient's family history of uterine cancer and FAP. Has had genetic screening which she reports was negative. Given handout on endometrial cancer. Advised that there were no exact screening tests for endometrial cancer, but surveillance can be performed with endometrial biopsy, and possible ultrasound. Discussed warning signs (any PMB, abnormal discharge) that should warrant further investigation.  History of uterine fibroids, stable, asymptomatic.  Return to Victoria, MD  [facility]

## 2019-06-18 NOTE — Patient Instructions (Signed)
Preventive Care 40-56 Years Old, Female Preventive care refers to visits with your health care provider and lifestyle choices that can promote health and wellness. This includes:  A yearly physical exam. This may also be called an annual well check.  Regular dental visits and eye exams.  Immunizations.  Screening for certain conditions.  Healthy lifestyle choices, such as eating a healthy diet, getting regular exercise, not using drugs or products that contain nicotine and tobacco, and limiting alcohol use. What can I expect for my preventive care visit? Physical exam Your health care provider will check your:  Height and weight. This may be used to calculate body mass index (BMI), which tells if you are at a healthy weight.  Heart rate and blood pressure.  Skin for abnormal spots. Counseling Your health care provider may ask you questions about your:  Alcohol, tobacco, and drug use.  Emotional well-being.  Home and relationship well-being.  Sexual activity.  Eating habits.  Work and work environment.  Method of birth control.  Menstrual cycle.  Pregnancy history. What immunizations do I need?  Influenza (flu) vaccine  This is recommended every year. Tetanus, diphtheria, and pertussis (Tdap) vaccine  You may need a Td booster every 10 years. Varicella (chickenpox) vaccine  You may need this if you have not been vaccinated. Zoster (shingles) vaccine  You may need this after age 60. Measles, mumps, and rubella (MMR) vaccine  You may need at least one dose of MMR if you were born in 1957 or later. You may also need a second dose. Pneumococcal conjugate (PCV13) vaccine  You may need this if you have certain conditions and were not previously vaccinated. Pneumococcal polysaccharide (PPSV23) vaccine  You may need one or two doses if you smoke cigarettes or if you have certain conditions. Meningococcal conjugate (MenACWY) vaccine  You may need this if you  have certain conditions. Hepatitis A vaccine  You may need this if you have certain conditions or if you travel or work in places where you may be exposed to hepatitis A. Hepatitis B vaccine  You may need this if you have certain conditions or if you travel or work in places where you may be exposed to hepatitis B. Haemophilus influenzae type b (Hib) vaccine  You may need this if you have certain conditions. Human papillomavirus (HPV) vaccine  If recommended by your health care provider, you may need three doses over 6 months. You may receive vaccines as individual doses or as more than one vaccine together in one shot (combination vaccines). Talk with your health care provider about the risks and benefits of combination vaccines. What tests do I need? Blood tests  Lipid and cholesterol levels. These may be checked every 5 years, or more frequently if you are over 50 years old.  Hepatitis C test.  Hepatitis B test. Screening  Lung cancer screening. You may have this screening every year starting at age 55 if you have a 30-pack-year history of smoking and currently smoke or have quit within the past 15 years.  Colorectal cancer screening. All adults should have this screening starting at age 50 and continuing until age 75. Your health care provider may recommend screening at age 45 if you are at increased risk. You will have tests every 1-10 years, depending on your results and the type of screening test.  Diabetes screening. This is done by checking your blood sugar (glucose) after you have not eaten for a while (fasting). You may have this   done every 1-3 years.  Mammogram. This may be done every 1-2 years. Talk with your health care provider about when you should start having regular mammograms. This may depend on whether you have a family history of breast cancer.  BRCA-related cancer screening. This may be done if you have a family history of breast, ovarian, tubal, or peritoneal  cancers.  Pelvic exam and Pap test. This may be done every 3 years starting at age 60. Starting at age 7, this may be done every 5 years if you have a Pap test in combination with an HPV test. Other tests  Sexually transmitted disease (STD) testing.  Bone density scan. This is done to screen for osteoporosis. You may have this scan if you are at high risk for osteoporosis. Follow these instructions at home: Eating and drinking  Eat a diet that includes fresh fruits and vegetables, whole grains, lean protein, and low-fat dairy.  Take vitamin and mineral supplements as recommended by your health care provider.  Do not drink alcohol if: ? Your health care provider tells you not to drink. ? You are pregnant, may be pregnant, or are planning to become pregnant.  If you drink alcohol: ? Limit how much you have to 0-1 drink a day. ? Be aware of how much alcohol is in your drink. In the [place] one drink equals one 12 oz bottle of beer (355 mL), one 5 oz glass of wine (148 mL), or one 1 oz glass of hard liquor (44 mL). Lifestyle  Take daily care of your teeth and gums.  Stay active. Exercise for at least 30 minutes on 5 or more days each week.  Do not use any products that contain nicotine or tobacco, such as cigarettes, e-cigarettes, and chewing tobacco. If you need help quitting, ask your health care provider.  If you are sexually active, practice safe sex. Use a condom or other form of birth control (contraception) in order to prevent pregnancy and STIs (sexually transmitted infections).  If told by your health care provider, take low-dose aspirin daily starting at age 48. What's next?  Visit your health care provider once a year for a well check visit.  Ask your health care provider how often you should have your eyes and teeth checked.  Stay up to date on all vaccines. This information is not intended to replace advice given to you by your health care provider. Make sure you  discuss any questions you have with your health care provider. Document Revised: 01/31/2018 Document Reviewed: 01/31/2018 Elsevier Patient Education  2020 [place] Breast self-awareness is knowing how your breasts look and feel. Doing breast self-awareness is important. It allows you to catch a breast problem early while it is still small and can be treated. All women should do breast self-awareness, including women who have had breast implants. Tell your doctor if you notice a change in your breasts. What you need:  A mirror.  A well-lit room. How to do a breast self-exam A breast self-exam is one way to learn what is normal for your breasts and to check for changes. To do a breast self-exam: Look for changes  1. Take off all the clothes above your waist. 2. Stand in front of a mirror in a room with good lighting. 3. Put your hands on your hips. 4. Push your hands down. 5. Look at your breasts and nipples in the mirror to see if one breast or nipple looks different from the  other. Check to see if: ? The shape of one breast is different. ? The size of one breast is different. ? There are wrinkles, dips, and bumps in one breast and not the other. 6. Look at each breast for changes in the skin, such as: ? Redness. ? Scaly areas. 7. Look for changes in your nipples, such as: ? Liquid around the nipples. ? Bleeding. ? Dimpling. ? Redness. ? A change in where the nipples are. Feel for changes  1. Lie on your back on the floor. 2. Feel each breast. To do this, follow these steps: ? Pick a breast to feel. ? Put the arm closest to that breast above your head. ? Use your other arm to feel the nipple area of your breast. Feel the area with the pads of your three middle fingers by making small circles with your fingers. For the first circle, press lightly. For the second circle, press harder. For the third circle, press even harder. ? Keep making circles with  your fingers at the different pressures as you move down your breast. Stop when you feel your ribs. ? Move your fingers a little toward the center of your body. ? Start making circles with your fingers again, this time going up until you reach your collarbone. ? Keep making up-and-down circles until you reach your armpit. Remember to keep using the three pressures. ? Feel the other breast in the same way. 3. Sit or stand in the tub or shower. 4. With soapy water on your skin, feel each breast the same way you did in step 2 when you were lying on the floor. Write down what you find Writing down what you find can help you remember what to tell your doctor. Write down:  What is normal for each breast.  Any changes you find in each breast, including: ? The kind of changes you find. ? Whether you have pain. ? Size and location of any lumps.  When you last had your menstrual period. General tips  Check your breasts every month.  If you are breastfeeding, the best time to check your breasts is after you feed your baby or after you use a breast pump.  If you get menstrual periods, the best time to check your breasts is 5-7 days after your menstrual period is over.  With time, you will become comfortable with the self-exam, and you will begin to know if there are changes in your breasts. Contact a doctor if you:  See a change in the shape or size of your breasts or nipples.  See a change in the skin of your breast or nipples, such as red or scaly skin.  Have fluid coming from your nipples that is not normal.  Find a lump or thick area that was not there before.  Have pain in your breasts.  Have any concerns about your breast health. Summary  Breast self-awareness includes looking for changes in your breasts, as well as feeling for changes within your breasts.  Breast self-awareness should be done in front of a mirror in a well-lit room.  You should check your breasts every month.  If you get menstrual periods, the best time to check your breasts is 5-7 days after your menstrual period is over.  Let your doctor know of any changes you see in your breasts, including changes in size, changes on the skin, pain or tenderness, or fluid from your nipples that is not normal. This information is not  intended to replace advice given to you by your health care provider. Make sure you discuss any questions you have with your health care provider. Document Revised: 01/08/2018 Document Reviewed: 01/08/2018 Elsevier Patient Education  [place].

## 2019-06-18 NOTE — Progress Notes (Signed)
Pt present for annual exam. Pt's last pap 01/25/17. Last mammogram 02/14/19. Pt's LMP 2017. Pt had flu vaccine on January 31, 2019.

## 2019-06-19 ENCOUNTER — Encounter: Payer: Self-pay | Admitting: Obstetrics and Gynecology

## 2019-07-02 ENCOUNTER — Other Ambulatory Visit: Payer: Self-pay | Admitting: Internal Medicine

## 2019-07-02 DIAGNOSIS — K5904 Chronic idiopathic constipation: Secondary | ICD-10-CM

## 2019-07-10 ENCOUNTER — Other Ambulatory Visit: Payer: Self-pay | Admitting: Internal Medicine

## 2019-07-26 ENCOUNTER — Other Ambulatory Visit: Payer: Self-pay | Admitting: Internal Medicine

## 2019-08-09 DIAGNOSIS — Z23 Encounter for immunization: Secondary | ICD-10-CM | POA: Diagnosis not present

## 2019-08-30 ENCOUNTER — Other Ambulatory Visit: Payer: Self-pay | Admitting: Internal Medicine

## 2019-08-30 DIAGNOSIS — Z23 Encounter for immunization: Secondary | ICD-10-CM | POA: Diagnosis not present

## 2019-09-03 ENCOUNTER — Other Ambulatory Visit: Payer: Self-pay | Admitting: Internal Medicine

## 2019-09-03 DIAGNOSIS — F419 Anxiety disorder, unspecified: Secondary | ICD-10-CM

## 2019-09-03 NOTE — Telephone Encounter (Signed)
Refill request for Xanax and Ambien, last seen 03/22/19, last filled Xanax-04-08-2019 ambien- 07-12-19.  Please advise.

## 2019-09-04 NOTE — Telephone Encounter (Signed)
Refill for 30 days only.  OFFICE VISIT NEEDED prior to any more refills 

## 2019-09-18 ENCOUNTER — Other Ambulatory Visit: Payer: Self-pay | Admitting: Internal Medicine

## 2019-09-18 DIAGNOSIS — L821 Other seborrheic keratosis: Secondary | ICD-10-CM | POA: Diagnosis not present

## 2019-09-18 DIAGNOSIS — L918 Other hypertrophic disorders of the skin: Secondary | ICD-10-CM | POA: Diagnosis not present

## 2019-09-23 ENCOUNTER — Other Ambulatory Visit: Payer: Self-pay

## 2019-09-23 ENCOUNTER — Telehealth: Payer: Self-pay | Admitting: Internal Medicine

## 2019-09-23 DIAGNOSIS — F419 Anxiety disorder, unspecified: Secondary | ICD-10-CM

## 2019-09-23 DIAGNOSIS — K5904 Chronic idiopathic constipation: Secondary | ICD-10-CM

## 2019-09-23 MED ORDER — LINACLOTIDE 290 MCG PO CAPS: ORAL_CAPSULE | ORAL | 1 refills | Status: DC

## 2019-09-23 MED ORDER — LINACLOTIDE 290 MCG PO CAPS: ORAL_CAPSULE | ORAL | 3 refills | Status: DC

## 2019-09-23 MED ORDER — ZOLPIDEM TARTRATE ER 12.5 MG PO TBCR: EXTENDED_RELEASE_TABLET | ORAL | 0 refills | Status: DC

## 2019-09-23 MED ORDER — ALPRAZOLAM 0.5 MG PO TABS: 0.5000 mg | ORAL_TABLET | Freq: Every evening | ORAL | 2 refills | Status: DC | PRN

## 2019-09-23 NOTE — Telephone Encounter (Signed)
Dawn Griffith called they are requesting 90 day supply on the following medication because pt insurance will be changing   zolpidem (AMBIEN CR) 12.5 MG CR tablet ALPRAZolam (XANAX) 0.5 MG tablet LINZESS 290 MCG CAPS capsule

## 2019-09-23 NOTE — Telephone Encounter (Signed)
Refill request, last seen 03-20-19, last filled 09-04-19 & Linzess was 07-02-19.  Please advise.

## 2019-09-23 NOTE — Addendum Note (Signed)
Addended by: Crecencio Mc on: 09/23/2019 09:52 AM   Modules accepted: Orders

## 2019-09-23 NOTE — Telephone Encounter (Signed)
Refilled: 09/04/2019 Last OV: 03/20/2019 Next OV: not scheduled

## 2019-10-03 ENCOUNTER — Other Ambulatory Visit: Payer: Self-pay | Admitting: *Deleted

## 2019-10-03 ENCOUNTER — Encounter: Payer: Self-pay | Admitting: *Deleted

## 2019-10-03 NOTE — Patient Outreach (Signed)
[place] [facility]) Care Management [facility] CM Telephone Outreach, insurance referral, new patient  10/03/2019  [name] 12/28/1965 [id number]  Unsuccessful telephone outreach to Dawn Griffith, 54 y/o female referred to [facility] RN CM by [facility] CMA on Aril 26, 2021 on insurance referral for [employer] HTN iniative.  Patient has history including, but not limited to, HTN/ HLD; GERD; obesity, and anxiety.  Patient has had no recent hospitalizations.  HIPAA compliant voice mail message left for patient, requesting return call back.  Plan:  Will place [facility] CM unsuccessful patient outreach letter in mail requesting call back in writing  Will re-attempt [place] telephone outreach within 4 business days if I do not hear back from patient first  Oneta Rack, RN, BSN, [employer] [facility] Care Management  [phone]

## 2019-10-07 ENCOUNTER — Encounter: Payer: Self-pay | Admitting: *Deleted

## 2019-10-07 ENCOUNTER — Other Ambulatory Visit: Payer: Self-pay | Admitting: *Deleted

## 2019-10-07 NOTE — Patient Outreach (Signed)
[place] [facility]) Care Management [facility] CM Telephone Outreach, new patient- insurance referral Unsuccessful (consecutive) outreach attempt # 2- new patient referral  10/07/2019  AMOY FARVER August 04, 1965 [id number]  Unsuccessful consecutive second telephone outreach attempt to Marliss Czar, 54 y/o female referred to [facility] RN CM by [facility] CMA on Aril 26, 2021 on insurance referral for [employer] HTN iniative.  Patient has history including, but not limited to, HTN/ HLD; GERD; obesity, and anxiety.  Patient has had no recent hospitalizations.  HIPAA compliant voice mail message left for patient, requesting return call back.  Plan:  Verified [facility] CM unsuccessful patient outreach letter placed in mail on October 03, 2019, requesting call back in writing  Will re-attempt [facility] CM telephone outreach within 4 business days if I do not hear back from patient first  Oneta Rack, RN, BSN, [employer] Coordinator [facility] Care Management  [phone]

## 2019-10-13 ENCOUNTER — Other Ambulatory Visit: Payer: Self-pay | Admitting: *Deleted

## 2019-10-13 ENCOUNTER — Encounter: Payer: Self-pay | Admitting: *Deleted

## 2019-10-13 NOTE — Patient Outreach (Signed)
[place] [facility]) Care Management [facility] CM Telephone Outreach, Routine insurance referral   10/13/2019  [name] [birth year] [id number]  Unsuccessful consecutive third telephone outreach attempt to Dawn Griffith, 54 y/o female referred to [place] [facility] CMA on Aril 26, 2021 on insurance referral for [employer] HTN iniative.Patient has history including, but not limited to, HTN/ HLD; GERD; obesity, and anxiety. Patient has had no recent hospitalizations.  HIPAA compliant voice mail message left for patient, requesting return call back.  Plan:  Verified [facility] CM unsuccessful patient outreach letter placed in mail on October 03, 2019, requesting call back in writing  Will move to [facility] CM case closure on Oct 16, 2019 (10 busines days from initial unsuccessful outreach) if I do not hear back from patient first  Oneta Rack, RN, BSN, [employer] Coordinator [facility] Care Management  [phone]

## 2019-10-16 ENCOUNTER — Other Ambulatory Visit: Payer: Self-pay | Admitting: *Deleted

## 2019-10-16 NOTE — Patient Outreach (Signed)
[place] [facility]) Care Management [facility] CM case closure note/ documentation only/ routine insurance referral Unable to establish contact with patient  10/16/2019  MIRIA FINNIE Jul 25, 1965 [id number]  [facility] CM Case Closure note re:  Marliss Czar, 54 y/o female referred to [facility] RN CM by [facility] CMA on Aril 26, 2021 on insurance referral for [employer] HTN iniative.Patient has history including, but not limited to, HTN/ HLD; GERD; obesity, and anxiety. Patient has had no recent hospitalizations.  Three consecutive unsuccessful outreach attempts have been placed to patient over course of 10 business days without patient response/ call back.  Plan:  Verified[facility] CM unsuccessful patient outreach letterplacedin mailon October 03, 2019,requesting call back in writing  Will close [facility] CM case as unable to establish contact with patient and make patient inactive with [facility] CM; will make patient's PCP aware of same  Oneta Rack, RN, BSN, [place] Coordinator [facility] Care Management  [phone]

## 2019-11-12 ENCOUNTER — Telehealth: Payer: Self-pay | Admitting: Internal Medicine

## 2019-12-31 ENCOUNTER — Other Ambulatory Visit: Payer: Self-pay | Admitting: Internal Medicine

## 2020-02-06 ENCOUNTER — Other Ambulatory Visit: Payer: Self-pay | Admitting: Internal Medicine

## 2020-02-10 ENCOUNTER — Other Ambulatory Visit: Payer: Self-pay | Admitting: Internal Medicine

## 2020-02-10 DIAGNOSIS — Z1231 Encounter for screening mammogram for malignant neoplasm of breast: Secondary | ICD-10-CM

## 2020-02-17 ENCOUNTER — Ambulatory Visit
Admission: RE | Admit: 2020-02-17 | Discharge: 2020-02-17 | Disposition: A | Discharge status: Home / Self Care | Payer: 59 | Source: Ambulatory Visit | Attending: Internal Medicine | Admitting: Internal Medicine

## 2020-02-17 ENCOUNTER — Other Ambulatory Visit: Payer: Self-pay

## 2020-02-17 DIAGNOSIS — Z1231 Encounter for screening mammogram for malignant neoplasm of breast: Secondary | ICD-10-CM | POA: Insufficient documentation

## 2020-02-20 ENCOUNTER — Ambulatory Visit (INDEPENDENT_AMBULATORY_CARE_PROVIDER_SITE_OTHER): Payer: 59 | Admitting: *Deleted

## 2020-02-20 ENCOUNTER — Other Ambulatory Visit: Payer: Self-pay

## 2020-02-20 DIAGNOSIS — Z23 Encounter for immunization: Secondary | ICD-10-CM | POA: Diagnosis not present

## 2020-03-18 ENCOUNTER — Other Ambulatory Visit: Payer: Self-pay | Admitting: Internal Medicine

## 2020-03-24 ENCOUNTER — Other Ambulatory Visit: Payer: Self-pay

## 2020-03-24 ENCOUNTER — Encounter: Payer: Self-pay | Admitting: Internal Medicine

## 2020-03-24 ENCOUNTER — Ambulatory Visit (INDEPENDENT_AMBULATORY_CARE_PROVIDER_SITE_OTHER): Payer: 59 | Admitting: Internal Medicine

## 2020-03-24 VITALS — BP 120/72 | HR 96 | Temp 98.6°F | Resp 15 | Ht 69.0 in | Wt 181.4 lb

## 2020-03-24 DIAGNOSIS — Z0000 Encounter for general adult medical examination without abnormal findings: Secondary | ICD-10-CM | POA: Diagnosis not present

## 2020-03-24 DIAGNOSIS — E049 Nontoxic goiter, unspecified: Secondary | ICD-10-CM

## 2020-03-24 DIAGNOSIS — F419 Anxiety disorder, unspecified: Secondary | ICD-10-CM | POA: Diagnosis not present

## 2020-03-24 DIAGNOSIS — R7303 Prediabetes: Secondary | ICD-10-CM

## 2020-03-24 DIAGNOSIS — I10 Essential (primary) hypertension: Secondary | ICD-10-CM

## 2020-03-24 DIAGNOSIS — E040 Nontoxic diffuse goiter: Secondary | ICD-10-CM

## 2020-03-24 MED ORDER — CLONAZEPAM 0.5 MG PO TABS: 0.2500 mg | ORAL_TABLET | Freq: Two times a day (BID) | ORAL | 0 refills | Status: DC | PRN

## 2020-03-24 NOTE — Patient Instructions (Signed)
Do not increase the alprazolam  I have prescribed clonazepam to use short term:  1/2 tablet once or twice daily   Return in one month for follow up

## 2020-03-24 NOTE — Assessment & Plan Note (Signed)
Unchanged by 2020 [place].  No further workup

## 2020-03-24 NOTE — Assessment & Plan Note (Signed)
Well controlled on current regimen of spironolactone 25 mg and losartan 100 mg daily . Renal function is due, no changes today.  Lab Results  Component Value Date   NA 137 03/20/2019   K 3.7 03/20/2019   CL 102 03/20/2019   CO2 27 03/20/2019

## 2020-03-24 NOTE — Assessment & Plan Note (Signed)

## 2020-03-24 NOTE — Progress Notes (Signed)
Patient ID: Dawn Griffith, female    DOB: 06-Sep-1965  Age: 54 y.o. MRN: [id number]  The patient is here for annual  wellness examination and management of other chronic and acute problems.  This visit occurred during the SARS-CoV-2 public health emergency.  Safety protocols were in place, including screening questions prior to the visit, additional usage of staff PPE, and extensive cleaning of exam room while observing appropriate contact time as indicated for disinfecting solutions.    The risk factors are reflected in the social history.  Diabetes Health Maintenance Due  Topic Date Due  . HEMOGLOBIN A1C  09/18/2019   The roster of all physicians providing medical care to patient - is listed in the Snapshot section of the chart.  Activities of daily living:  The patient is 100% independent in all ADLs: dressing, toileting, feeding as well as independent mobility  Home safety : The patient has smoke detectors in the home. They wear seatbelts.  There are no firearms at home. There is no violence in the home.   There is no risks for hepatitis, STDs or HIV. There is no   history of blood transfusion. They have no travel history to infectious disease endemic areas of the world.  The patient has seen their dentist in the last six month. They have seen their eye doctor in the last year. They admit to slight hearing difficulty with regard to whispered voices and some television programs.  They have deferred audiologic testing in the last year.  They do not  have excessive sun exposure. Discussed the need for sun protection: hats, long sleeves and use of sunscreen if there is significant sun exposure.   Diet: the importance of a healthy diet is discussed. They do have a healthy diet.  The benefits of regular aerobic exercise were discussed. She walks 4 times per week ,  20 minutes.   Depression screen: there are no signs or vegative symptoms of depression- irritability, change in appetite,  anhedonia, sadness/tearfullness.   The following portions of the patient's history were reviewed and updated as appropriate: allergies, current medications, past family history, past medical history,  past surgical history, past social history  and problem list.  Visual acuity was not assessed per patient preference since she has regular follow up with her ophthalmologist. Hearing and body mass index were assessed and reviewed.   During the course of the visit the patient was educated and counseled about appropriate screening and preventive services including : fall prevention , diabetes screening, nutrition counseling, colorectal cancer screening, and recommended immunizations.    CC: The primary encounter diagnosis was Prediabetes. Diagnoses of Well woman exam (no gynecological exam), Goiter diffuse, Anxiety, and Essential hypertension, benign were also pertinent to this visit.  1)Anxiety:  Family stressors: parents,  In laws,  Father in law died yesterday mother in law Arzu Mcgaughey having dementia with wandering at night.  Had to make 3 trips to [facility] span of 5 weeks:  Due to parents selling house and uncle being killed in a plane crash. Sleep affected in spite of elavil 100 mg and ambien cr 12.5 mg daily  Using xanax , dose reduced from 2 to 1 daily.  Wants to go back up on dosing.  Advised not to do this .  Does not want SSRi  Prior trial of wellbutrin gave her headaches, despite greater than 2 week trial.  priro trial of zolof,  Doesn't remember why she didn't like it    2)  Genetic testing done by obgyn due to  Maternal history of polyposis.  Patient's colonoscopy was negative for polyps in 2018 colonoscopy.  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   She has a past surgical history that includes Cesarean section (1992); Ectopic pregnancy surgery (1997); Lymph node biopsy; Nasal sinus surgery; Colonoscopy; and Cholecystectomy (Dec 2012).   Her family history includes Arrhythmia in her mother; Colon polyps in her mother; Diabetes in her father and mother; Hypertension in her brother, father, and mother; Kidney disease in her mother; Uterine cancer in her mother.She reports that she has never smoked. She has never used smokeless tobacco. She reports that she does not drink alcohol and does not use drugs.  Outpatient Medications Prior to Visit  Medication Sig Dispense Refill  . ALPRAZolam (XANAX) 0.5 MG tablet Take 1 tablet (0.5 mg total) by mouth at bedtime as needed for anxiety. 90 tablet 2  . amitriptyline (ELAVIL) 100 MG tablet TAKE ONE TABLET BY MOUTH EVERY NIGHT AT BEDTIME 90 tablet 1  . cetirizine (ZYRTEC) 10 MG tablet Take 10 mg by mouth daily.      . cholecalciferol (VITAMIN D) 1000 units tablet Take 1,000 Units by mouth daily.    . Dapsone 7.5 % GEL APPLY A THIN LAYER TOPICALLY TO ENTIRE FACE DAILY 60 g 1  . docusate sodium (COLACE) 100 MG capsule Take 100 mg by mouth 2 (two) times daily.    . Lactobacillus (PROBIOTIC ACIDOPHILUS PO) Take 1 tablet by mouth daily.    Marland Kitchen linaclotide (LINZESS) 290 MCG CAPS capsule TAKE ONE CAPSULE BY MOUTH EVERY MORNING BEFORE MEALS 90 capsule 1  . losartan (COZAAR) 100 MG tablet TAKE ONE TABLET BY MOUTH DAILY 90 tablet 0  . Multiple Vitamin (MULTI-VITAMIN DAILY PO) Take by mouth.    Marland Kitchen omeprazole (PRILOSEC) 40 MG capsule TAKE ONE CAPSULE BY MOUTH DAILY 90 capsule 3  . spironolactone (ALDACTONE) 25 MG tablet TAKE ONE TABLET BY MOUTH DAILY 90 tablet 0  . ZINC-VITAMIN C PO Take by mouth.    . zolpidem (AMBIEN CR) 12.5 MG CR tablet TAKE ONE TABLET BY MOUTH EVERY  NIGHT AT BEDTIME AS NEEDED FOR SLEEP 90 tablet 1   No facility-administered medications prior to visit.    Review of Systems   Patient denies headache, fevers, malaise, unintentional weight loss, skin rash, eye pain, sinus congestion and sinus pain, sore throat, dysphagia,  hemoptysis , cough, dyspnea, wheezing, chest pain, palpitations, orthopnea, edema, abdominal pain, nausea, melena, diarrhea, constipation, flank pain, dysuria, hematuria, urinary  Frequency, nocturia, numbness, tingling, seizures,  Focal weakness, Loss of consciousness,  Tremor, insomnia, depression, anxiety, and suicidal ideation.      Objective:  BP 120/72 (BP Location: Left Arm, Patient Position: Sitting, Cuff Size: Normal)   Pulse 96   Temp 98.6 F (37 C) (Oral)   Resp 15   Ht 5\' 9"  (1.753 m)   Wt 181 lb 6.4 oz (82.3 kg)   LMP 05/01/2014 (Approximate)   SpO2 98%   BMI 26.79 kg/m   Physical Exam  General appearance: alert, cooperative and appears stated age Ears: normal TM's and external ear canals both ears Throat: lips, mucosa, and tongue normal; teeth and gums normal Neck: no adenopathy, no carotid bruit,  supple, symmetrical, trachea midline and thyroid not enlarged, symmetric, no tenderness/mass/nodules Back: symmetric, no curvature. ROM normal. No CVA tenderness. Lungs: clear to auscultation bilaterally Heart: regular rate and rhythm, S1, S2 normal, no murmur, click, rub or gallop Abdomen: soft, non-tender; bowel sounds normal; no masses,  no organomegaly Pulses: 2+ and symmetric Skin: Skin color, texture, turgor normal. No rashes or lesions Lymph nodes: Cervical, supraclavicular, and axillary nodes normal.  Assessment & Plan:   Problem List Items Addressed This Visit      Unprioritized   Anxiety    Worsening symptoms due to recent death of father in law and increased family stressors   Clonazepam described for short term use   The risks and benefits of benzodiazepine use were discussed with  patient today including excessive sedation leading to respiratory depression,  impaired thinking/driving, and addiction.  Patient was advised to avoid concurrent use with alcohol, to use medication only as needed and not to share with others  .       Essential hypertension, benign    Well controlled on current regimen of spironolactone 25 mg and losartan 100 mg daily . Renal function is due, no changes today.  Lab Results  Component Value Date   NA 137 03/20/2019   K 3.7 03/20/2019   CL 102 03/20/2019   CO2 27 03/20/2019         Relevant Orders   Comprehensive metabolic panel   Microalbumin / creatinine urine ratio   Goiter diffuse    Unchanged by 2020 [place].  No further workup       Prediabetes - Primary   Relevant Orders   Hemoglobin A1c   Lipid panel   Well woman exam (no gynecological exam)    age appropriate education and counseling updated, referrals for preventative services and immunizations addressed, dietary and smoking counseling addressed, most recent labs reviewed.  I have personally reviewed and have noted:  1) the patient's medical and social history 2) The pt's use of alcohol, tobacco, and illicit drugs 3) The patient's current medications and supplements 4) Functional ability including ADL's, fall risk, home safety risk, hearing and visual impairment 5) Diet and physical activities 6) Evidence for depression or mood disorder 7) The patient's height, weight, and BMI have been recorded in the chart  I have made referrals, and provided counseling and education based on review of the above         I am having Mycah C. Ow start on clonazePAM. I am also having her maintain her cetirizine, Lactobacillus (PROBIOTIC ACIDOPHILUS PO), docusate sodium, cholecalciferol, ZINC-VITAMIN C PO, Multiple Vitamin (MULTI-VITAMIN DAILY PO), Dapsone, ALPRAZolam, linaclotide, zolpidem, amitriptyline, losartan, spironolactone, and omeprazole.  Meds ordered this encounter   Medications  . clonazePAM (KLONOPIN) 0.5 MG tablet    Sig: Take 0.5 tablets (0.25 mg total) by mouth 2 (two) times daily as needed for anxiety.    Dispense:  30 tablet    Refill:  0    There are no discontinued medications.  Follow-up: Return in about 4 weeks (around 04/21/2020).   Crecencio Mc, MD

## 2020-03-24 NOTE — Assessment & Plan Note (Signed)
Worsening symptoms due to recent death of father in law and increased family stressors   Clonazepam described for short term use   The risks and benefits of benzodiazepine use were discussed with patient today including excessive sedation leading to respiratory depression,  impaired thinking/driving, and addiction.  Patient was advised to avoid concurrent use with alcohol, to use medication only as needed and not to share with others  .

## 2020-04-23 ENCOUNTER — Encounter: Payer: Self-pay | Admitting: Internal Medicine

## 2020-04-23 ENCOUNTER — Ambulatory Visit: Payer: 59 | Admitting: Internal Medicine

## 2020-04-23 ENCOUNTER — Other Ambulatory Visit: Payer: Self-pay

## 2020-04-23 VITALS — BP 100/74 | HR 96 | Temp 98.6°F | Resp 14 | Ht 69.0 in | Wt 179.4 lb

## 2020-04-23 DIAGNOSIS — F419 Anxiety disorder, unspecified: Secondary | ICD-10-CM

## 2020-04-23 DIAGNOSIS — E663 Overweight: Secondary | ICD-10-CM

## 2020-04-23 DIAGNOSIS — E785 Hyperlipidemia, unspecified: Secondary | ICD-10-CM

## 2020-04-23 DIAGNOSIS — R7303 Prediabetes: Secondary | ICD-10-CM

## 2020-04-23 DIAGNOSIS — I10 Essential (primary) hypertension: Secondary | ICD-10-CM

## 2020-04-23 DIAGNOSIS — E049 Nontoxic goiter, unspecified: Secondary | ICD-10-CM | POA: Diagnosis not present

## 2020-04-23 DIAGNOSIS — E040 Nontoxic diffuse goiter: Secondary | ICD-10-CM

## 2020-04-23 DIAGNOSIS — E559 Vitamin D deficiency, unspecified: Secondary | ICD-10-CM | POA: Diagnosis not present

## 2020-04-23 DIAGNOSIS — F5105 Insomnia due to other mental disorder: Secondary | ICD-10-CM

## 2020-04-23 LAB — HEMOGLOBIN A1C: Hgb A1c MFr Bld: 5.8 % (ref 4.6–6.5)

## 2020-04-23 LAB — LIPID PANEL
Cholesterol: 226 mg/dL — ABNORMAL HIGH (ref 0–200)
HDL: 52.8 mg/dL (ref >39.00)
LDL Cholesterol: 146 mg/dL — ABNORMAL HIGH (ref 0–99)
NonHDL: 173.11
Total CHOL/HDL Ratio: 4
Triglycerides: 137 mg/dL (ref 0.0–149.0)
VLDL: 27.4 mg/dL (ref 0.0–40.0)

## 2020-04-23 LAB — COMPREHENSIVE METABOLIC PANEL
ALT: 16 U/L (ref 0–35)
AST: 17 U/L (ref 0–37)
Albumin: 4.8 g/dL (ref 3.5–5.2)
Alkaline Phosphatase: 78 U/L (ref 39–117)
BUN: 12 mg/dL (ref 6–23)
CO2: 29 mEq/L (ref 19–32)
Calcium: 10.1 mg/dL (ref 8.4–10.5)
Chloride: 102 mEq/L (ref 96–112)
Creatinine, Ser: 0.84 mg/dL (ref 0.40–1.20)
GFR: 77.68 mL/min (ref >60.00)
Glucose, Bld: 98 mg/dL (ref 70–99)
Potassium: 4.2 mEq/L (ref 3.5–5.1)
Sodium: 139 mEq/L (ref 135–145)
Total Bilirubin: 0.7 mg/dL (ref 0.2–1.2)
Total Protein: 7.2 g/dL (ref 6.0–8.3)

## 2020-04-23 LAB — MICROALBUMIN / CREATININE URINE RATIO
Creatinine,U: 169.2 mg/dL
Microalb Creat Ratio: 0.6 mg/g (ref 0.0–30.0)
Microalb, Ur: 1 mg/dL (ref 0.0–1.9)

## 2020-04-23 LAB — TSH: TSH: 0.97 u[IU]/mL (ref 0.35–4.50)

## 2020-04-23 LAB — VITAMIN D 25 HYDROXY (VIT D DEFICIENCY, FRACTURES): VITD: 47.92 ng/mL (ref 30.00–100.00)

## 2020-04-23 MED ORDER — FLUOXETINE HCL 10 MG PO CAPS: 10.0000 mg | ORAL_CAPSULE | Freq: Every day | ORAL | 1 refills | Status: DC

## 2020-04-23 MED ORDER — CLONAZEPAM 0.5 MG PO TABS: 0.2500 mg | ORAL_TABLET | Freq: Two times a day (BID) | ORAL | 1 refills | Status: DC | PRN

## 2020-04-23 NOTE — Progress Notes (Signed)
Subjective:  Patient ID: Dawn Griffith, female    DOB: 1965-08-16  Age: 54 y.o. MRN: [id number]  CC: The primary encounter diagnosis was Goiter diffuse. Diagnoses of Vitamin D deficiency, Essential hypertension, benign, Prediabetes, Hyperlipidemia LDL goal <130, Anxiety, Insomnia secondary to anxiety, and Overweight (BMI 25.0-29.9) were also pertinent to this visit.  HPI Janaysha C Oshields presents for follow up om chronic issues including anxiety, insomnia, hypertension   This visit occurred during the SARS-CoV-2 public health emergency.  Safety protocols were in place, including screening questions prior to the visit, additional usage of staff PPE, and extensive cleaning of exam room while observing appropriate contact time as indicated for disinfecting solutions.    Patient has received both doses of the available COVID 19 vaccine without complications.  Patient continues to mask when outside of the home except when walking in yard or at safe distances from others .  Patient denies any change in mood or development of unhealthy behaviors resuting from the pandemic's restriction of activities and socialization.   1) GAD:  Using clonazepaM 0.25 mg bid.  HAVING episodes that feel like panic attacks nearly  Once a week.   Worrying about both parents  Who are independent but make bad decisions.  Best friend  (who is like another father to her) has brain CA .  She has not used the alprazolam except for insomnia , not daily  .  Has taken prozac in the distat past,  No side effects recalled.  2) Hypertension: patient checks blood pressure twice weekly at home.  Readings have been for the most part > 140/80 at rest . Patient is following a reduce salt diet most days and is taking medications as prescribed     Outpatient Medications Prior to Visit  Medication Sig Dispense Refill  . amitriptyline (ELAVIL) 100 MG tablet TAKE ONE TABLET BY MOUTH EVERY NIGHT AT BEDTIME 90 tablet 1  . cetirizine (ZYRTEC) 10  MG tablet Take 10 mg by mouth daily.      . cholecalciferol (VITAMIN D) 1000 units tablet Take 1,000 Units by mouth daily.    . Dapsone 7.5 % GEL APPLY A THIN LAYER TOPICALLY TO ENTIRE FACE DAILY 60 g 1  . docusate sodium (COLACE) 100 MG capsule Take 100 mg by mouth 2 (two) times daily.    . Lactobacillus (PROBIOTIC ACIDOPHILUS PO) Take 1 tablet by mouth daily.    Marland Kitchen linaclotide (LINZESS) 290 MCG CAPS capsule TAKE ONE CAPSULE BY MOUTH EVERY MORNING BEFORE MEALS 90 capsule 1  . losartan (COZAAR) 100 MG tablet TAKE ONE TABLET BY MOUTH DAILY 90 tablet 0  . Multiple Vitamin (MULTI-VITAMIN DAILY PO) Take by mouth.    Marland Kitchen omeprazole (PRILOSEC) 40 MG capsule TAKE ONE CAPSULE BY MOUTH DAILY 90 capsule 3  . spironolactone (ALDACTONE) 25 MG tablet TAKE ONE TABLET BY MOUTH DAILY 90 tablet 0  . ZINC-VITAMIN C PO Take by mouth.    . zolpidem (AMBIEN CR) 12.5 MG CR tablet TAKE ONE TABLET BY MOUTH EVERY NIGHT AT BEDTIME AS NEEDED FOR SLEEP 90 tablet 1  . ALPRAZolam (XANAX) 0.5 MG tablet Take 1 tablet (0.5 mg total) by mouth at bedtime as needed for anxiety. 90 tablet 2  . clonazePAM (KLONOPIN) 0.5 MG tablet Take 0.5 tablets (0.25 mg total) by mouth 2 (two) times daily as needed for anxiety. 30 tablet 0   No facility-administered medications prior to visit.    Review of Systems;  Patient denies headache, fevers, malaise, unintentional  weight loss, skin rash, eye pain, sinus congestion and sinus pain, sore throat, dysphagia,  hemoptysis , cough, dyspnea, wheezing, chest pain, palpitations, orthopnea, edema, abdominal pain, nausea, melena, diarrhea, constipation, flank pain, dysuria, hematuria, urinary  Frequency, nocturia, numbness, tingling, seizures,  Focal weakness, Loss of consciousness,  Tremor, insomnia, depression, anxiety, and suicidal ideation.      Objective:  BP 100/74 (BP Location: Left Arm, Patient Position: Sitting, Cuff Size: Normal)   Pulse 96   Temp 98.6 F (37 C) (Oral)   Resp 14   Ht 5'  9" (1.753 m)   Wt 179 lb 6.4 oz (81.4 kg)   LMP 05/01/2014 (Approximate)   SpO2 98%   BMI 26.49 kg/m   BP Readings from Last 3 Encounters:  04/23/20 100/74  03/24/20 120/72  06/18/19 (!) 141/83    Wt Readings from Last 3 Encounters:  04/23/20 179 lb 6.4 oz (81.4 kg)  03/24/20 181 lb 6.4 oz (82.3 kg)  06/18/19 183 lb 3.2 oz (83.1 kg)    General appearance: alert, cooperative and appears stated age Ears: normal TM's and external ear canals both ears Throat: lips, mucosa, and tongue normal; teeth and gums normal Neck: no adenopathy, no carotid bruit, supple, symmetrical, trachea midline and thyroid not enlarged, symmetric, no tenderness/mass/nodules Back: symmetric, no curvature. ROM normal. No CVA tenderness. Lungs: clear to auscultation bilaterally Heart: regular rate and rhythm, S1, S2 normal, no murmur, click, rub or gallop Abdomen: soft, non-tender; bowel sounds normal; no masses,  no organomegaly Pulses: 2+ and symmetric Skin: Skin color, texture, turgor normal. No rashes or lesions Lymph nodes: Cervical, supraclavicular, and axillary nodes normal.  Lab Results  Component Value Date   HGBA1C 5.8 04/23/2020   HGBA1C 6.0 03/20/2019   HGBA1C 5.6 05/20/2018    Lab Results  Component Value Date   CREATININE 0.84 04/23/2020   CREATININE 0.80 03/20/2019   CREATININE 0.82 08/05/2018    Lab Results  Component Value Date   WBC 11.9 (H) 08/05/2018   HGB 14.3 08/05/2018   HCT 43.9 08/05/2018   PLT 399 08/05/2018   GLUCOSE 98 04/23/2020   CHOL 226 (H) 04/23/2020   TRIG 137.0 04/23/2020   HDL 52.80 04/23/2020   LDLDIRECT 155.0 01/22/2017   LDLCALC 146 (H) 04/23/2020   ALT 16 04/23/2020   AST 17 04/23/2020   NA 139 04/23/2020   K 4.2 04/23/2020   CL 102 04/23/2020   CREATININE 0.84 04/23/2020   BUN 12 04/23/2020   CO2 29 04/23/2020   TSH 0.97 04/23/2020   HGBA1C 5.8 04/23/2020   MICROALBUR 1.0 04/23/2020    MM 3D SCREEN BREAST BILATERAL  Result Date:  02/18/2020 CLINICAL DATA:  Screening. EXAM: DIGITAL SCREENING BILATERAL MAMMOGRAM WITH TOMO AND CAD COMPARISON:  Previous exam(s). ACR Breast Density Category c: The breast tissue is heterogeneously dense, which may obscure small masses. FINDINGS: There are no findings suspicious for malignancy. Images were processed with CAD. IMPRESSION: No mammographic evidence of malignancy. A result letter of this screening mammogram will be mailed directly to the patient. RECOMMENDATION: Screening mammogram in one year. (Code:SM-B-01Y) BI-RADS CATEGORY  1: Negative. Electronically Signed   By: Valentino Saxon MD   On: 02/18/2020 09:05    Assessment & Plan:   Problem List Items Addressed This Visit      Unprioritized   Vitamin D deficiency   Relevant Orders   VITAMIN D 25 Hydroxy (Vit-D Deficiency, Fractures) (Completed)   Prediabetes    She has lowered her A1c with  A low  glycemic index diet and has intentionally lost weight with regular participation in an aerobic activity.  We will recheck an A1c annually   Lab Results  Component Value Date   HGBA1C 5.8 04/23/2020         Overweight (BMI 25.0-29.9)    I have addressed  BMI and recommended a low glycemic index diet utilizing smaller more frequent meals to increase metabolism.  I have also recommended that patient start exercising with a goal of 30 minutes of aerobic exercise a minimum of 5 days per week.        Insomnia secondary to anxiety    Managed with elavil and ambien.  No changes today.  The risks and benefits of ambien use  were discussed with patient today including excessive sedation leading to respiratory depression,  impaired thinking/driving, and dependence .  Patient was advised to avoid concurrent use with alcohol, to use medication only as needed and not to share with others  .       Relevant Medications   FLUoxetine (PROZAC) 10 MG capsule   Hyperlipidemia LDL goal <130    10 yr risk using the AHA calculator is 2%.  No  indication for statin therapy at present.   Lab Results  Component Value Date   CHOL 226 (H) 04/23/2020   HDL 52.80 04/23/2020   LDLCALC 146 (H) 04/23/2020   LDLDIRECT 155.0 01/22/2017   TRIG 137.0 04/23/2020   CHOLHDL 4 04/23/2020         Goiter diffuse - Primary    Unchanged by 2020 [place].  No further workup is advised. Annual screening TSH is normal   Lab Results  Component Value Date   TSH 0.97 04/23/2020         Relevant Orders   TSH (Completed)   Essential hypertension, benign   Anxiety    Discussed nonpharmacologic ways to reduce anxiety level during the day. Family stressors playing a significant role due to aging parents and in laws.   Starting prozac at 10 mg daily to reduce scheduled use of clonazepam . Will titrate as needed on a monthly basis.       Relevant Medications   FLUoxetine (PROZAC) 10 MG capsule    A total of 30 minutes of face to face time was spent with patient more than half of which was spent in counselling and coordination of care   I have discontinued Loanne C. Dudziak's ALPRAZolam. I am also having her start on FLUoxetine. Additionally, I am having her maintain her cetirizine, Lactobacillus (PROBIOTIC ACIDOPHILUS PO), docusate sodium, cholecalciferol, ZINC-VITAMIN C PO, Multiple Vitamin (MULTI-VITAMIN DAILY PO), Dapsone, linaclotide, zolpidem, amitriptyline, losartan, spironolactone, omeprazole, and clonazePAM.  Meds ordered this encounter  Medications  . FLUoxetine (PROZAC) 10 MG capsule    Sig: Take 1 capsule (10 mg total) by mouth daily.    Dispense:  90 capsule    Refill:  1  . clonazePAM (KLONOPIN) 0.5 MG tablet    Sig: Take 0.5 tablets (0.25 mg total) by mouth 2 (two) times daily as needed for anxiety.    Dispense:  60 tablet    Refill:  1    Medications Discontinued During This Encounter  Medication Reason  . ALPRAZolam (XANAX) 0.5 MG tablet   . clonazePAM (KLONOPIN) 0.5 MG tablet Reorder    Follow-up: Return in about 4 months  (around 08/21/2020).   Crecencio Mc, MD

## 2020-04-23 NOTE — Patient Instructions (Signed)
Try using the headspace app  To help unwind at bedtime   I am Starting prozac 10 mg daily with lunch.  Start with 1/2 tablet to avoid nausea  Or the first 4 to 6 days   After 2 weeks of full dose.  If not feeling less anxious,  Let me know and I will increase the dose

## 2020-04-25 NOTE — Progress Notes (Signed)
Your labs are all fine, including your cholesterol , thyroid, and a1c.   .  Continue taking a vitamin D supplement 1000 Ius  daily   Regards,   Deborra Medina, MD

## 2020-04-25 NOTE — Assessment & Plan Note (Signed)
Managed with elavil and ambien.  No changes today.  The risks and benefits of ambien use  were discussed with patient today including excessive sedation leading to respiratory depression,  impaired thinking/driving, and dependence .  Patient was advised to avoid concurrent use with alcohol, to use medication only as needed and not to share with others  .  

## 2020-04-25 NOTE — Assessment & Plan Note (Signed)
10 yr risk using the AHA calculator is 2%.  No indication for statin therapy at present.   Lab Results  Component Value Date   CHOL 226 (H) 04/23/2020   HDL 52.80 04/23/2020   LDLCALC 146 (H) 04/23/2020   LDLDIRECT 155.0 01/22/2017   TRIG 137.0 04/23/2020   CHOLHDL 4 04/23/2020

## 2020-04-25 NOTE — Assessment & Plan Note (Addendum)
Discussed nonpharmacologic ways to reduce anxiety level during the day. Family stressors playing a significant role due to aging parents and in laws.   Starting prozac at 10 mg daily to reduce scheduled use of clonazepam . Will titrate as needed on a monthly basis.

## 2020-04-25 NOTE — Assessment & Plan Note (Signed)
Unchanged by 2020 [place].  No further workup is advised. Annual screening TSH is normal   Lab Results  Component Value Date   TSH 0.97 04/23/2020

## 2020-04-25 NOTE — Assessment & Plan Note (Signed)
I have addressed  BMI and recommended a low glycemic index diet utilizing smaller more frequent meals to increase metabolism.  I have also recommended that patient start exercising with a goal of 30 minutes of aerobic exercise a minimum of 5 days per week.  

## 2020-04-25 NOTE — Assessment & Plan Note (Signed)
She has lowered her A1c with  A low  glycemic index diet and has intentionally lost weight with regular participation in an aerobic activity.  We will recheck an A1c annually   Lab Results  Component Value Date   HGBA1C 5.8 04/23/2020

## 2020-06-12 ENCOUNTER — Other Ambulatory Visit: Payer: Self-pay | Admitting: Internal Medicine

## 2020-06-14 NOTE — Telephone Encounter (Signed)
RX Refill:ambien Last Seen:04-23-20 Last ordered:12-31-19

## 2020-06-30 ENCOUNTER — Encounter: Payer: Self-pay | Admitting: Obstetrics and Gynecology

## 2020-08-05 ENCOUNTER — Other Ambulatory Visit: Payer: Self-pay | Admitting: Internal Medicine

## 2020-08-05 NOTE — Telephone Encounter (Signed)
RX Refill:klonopin Last Seen:04-23-20 Last ordered:04-23-20

## 2020-08-27 ENCOUNTER — Ambulatory Visit: Payer: 59 | Admitting: Internal Medicine

## 2020-08-31 ENCOUNTER — Other Ambulatory Visit: Payer: Self-pay

## 2020-08-31 ENCOUNTER — Ambulatory Visit (INDEPENDENT_AMBULATORY_CARE_PROVIDER_SITE_OTHER): Payer: 59 | Admitting: Internal Medicine

## 2020-08-31 ENCOUNTER — Encounter: Payer: Self-pay | Admitting: Internal Medicine

## 2020-08-31 VITALS — BP 122/70 | HR 91 | Temp 98.9°F | Ht 69.02 in

## 2020-08-31 DIAGNOSIS — F419 Anxiety disorder, unspecified: Secondary | ICD-10-CM

## 2020-08-31 DIAGNOSIS — I10 Essential (primary) hypertension: Secondary | ICD-10-CM

## 2020-08-31 DIAGNOSIS — E049 Nontoxic goiter, unspecified: Secondary | ICD-10-CM | POA: Diagnosis not present

## 2020-08-31 DIAGNOSIS — R7303 Prediabetes: Secondary | ICD-10-CM | POA: Diagnosis not present

## 2020-08-31 DIAGNOSIS — E785 Hyperlipidemia, unspecified: Secondary | ICD-10-CM | POA: Diagnosis not present

## 2020-08-31 DIAGNOSIS — F5105 Insomnia due to other mental disorder: Secondary | ICD-10-CM

## 2020-08-31 DIAGNOSIS — F411 Generalized anxiety disorder: Secondary | ICD-10-CM

## 2020-08-31 DIAGNOSIS — E040 Nontoxic diffuse goiter: Secondary | ICD-10-CM

## 2020-08-31 MED ORDER — FLUOXETINE HCL 20 MG PO CAPS
20.0000 mg | ORAL_CAPSULE | Freq: Every day | ORAL | 1 refills | Status: DC
Start: 2020-08-31 — End: 2021-03-03

## 2020-08-31 NOTE — Progress Notes (Signed)
Subjective:  Patient ID: Dawn Griffith, female    DOB: Nov 04, 1965  Age: 55 y.o. MRN: [id number]  CC: The primary encounter diagnosis was Hyperlipidemia LDL goal <130. Diagnoses of Prediabetes, Goiter diffuse, Insomnia secondary to anxiety, Generalized anxiety disorder, and Essential hypertension, benign were also pertinent to this visit.  HPI Dawn Griffith presents for FOLLOW UP ON generalized anxiety    This visit occurred during the SARS-CoV-2 public health emergency.  Safety protocols were in place, including screening questions prior to the visit, additional usage of staff PPE, and extensive cleaning of exam room while observing appropriate contact time as indicated for disinfecting solutions.   Uncontrolled anxiety due to increased Stress:  She has had multiple family members  Over the past 6 months face diagnoses of cancer or have died.  (father has been diagnosed with bladder CA,  And her "adopted father " died).  She has felt  Spread too thin helping both parents who have been recently hospitalized  While working full time. Parents live 25 minutes away. Stressed out.  Had a syncopal episode while attending  the funeral of her "adopted father"  She has 2 brothers who have not helped out at all   Good news: daughter Dawn Griffith graduating from [place] school in June and has a job.   Had a long weekend with Husband Dawn Griffith and parents at [employer] last weekend.  and had a wonderful restful time.  Feels refreshed .  Feels she could handle the responsibilities better if she, Dawn Griffith and parents bought side by side townhomes [employer] homes")  But father is reluctant to leave his farmhouse and land.   mother in law Dawn Griffith   Patient is taking her medications as prescribed and notes no adverse effects.  Home BP readings have been done about once per week and are  generally < 130/80 .  She is avoiding added salt in her diet and walking regularly about 3 times per week for exercise  .   Outpatient  Medications Prior to Visit  Medication Sig Dispense Refill  . amitriptyline (ELAVIL) 100 MG tablet TAKE ONE TABLET BY MOUTH EVERY NIGHT AT BEDTIME 90 tablet 1  . cetirizine (ZYRTEC) 10 MG tablet Take 10 mg by mouth daily.      . cholecalciferol (VITAMIN D) 1000 units tablet Take 1,000 Units by mouth daily.    . clonazePAM (KLONOPIN) 0.5 MG tablet TAKE 1/2 TABLET BY MOUTH TWICE A DAY AS NEEDED FOR ANXIETY 60 tablet 2  . Dapsone 7.5 % GEL APPLY A THIN LAYER TOPICALLY TO ENTIRE FACE DAILY 60 g 1  . docusate sodium (COLACE) 100 MG capsule Take 100 mg by mouth 2 (two) times daily.    . Lactobacillus (PROBIOTIC ACIDOPHILUS PO) Take 1 tablet by mouth daily.    Marland Kitchen linaclotide (LINZESS) 290 MCG CAPS capsule TAKE ONE CAPSULE BY MOUTH EVERY MORNING BEFORE MEALS 90 capsule 1  . losartan (COZAAR) 100 MG tablet TAKE ONE TABLET BY MOUTH DAILY 90 tablet 0  . Multiple Vitamin (MULTI-VITAMIN DAILY PO) Take by mouth.    Marland Kitchen omeprazole (PRILOSEC) 40 MG capsule TAKE ONE CAPSULE BY MOUTH DAILY 90 capsule 3  . spironolactone (ALDACTONE) 25 MG tablet TAKE ONE TABLET BY MOUTH DAILY 90 tablet 0  . ZINC-VITAMIN C PO Take by mouth.    . zolpidem (AMBIEN CR) 12.5 MG CR tablet TAKE ONE TABLET BY MOUTH EVERY NIGHT AT BEDTIME AS NEEDED FOR SLEEP 30 tablet 5  . FLUoxetine (PROZAC) 10  MG capsule Take 1 capsule (10 mg total) by mouth daily. 90 capsule 1   No facility-administered medications prior to visit.    Review of Systems;  Patient denies headache, fevers, malaise, unintentional weight loss, skin rash, eye pain, sinus congestion and sinus pain, sore throat, dysphagia,  hemoptysis , cough, dyspnea, wheezing, chest pain, palpitations, orthopnea, edema, abdominal pain, nausea, melena, diarrhea, constipation, flank pain, dysuria, hematuria, urinary  Frequency, nocturia, numbness, tingling, seizures,  Focal weakness, Loss of consciousness,  Tremor, insomnia, depression, anxiety, and suicidal ideation.      Objective:  BP  122/70 (BP Location: Left Arm, Patient Position: Sitting)   Pulse 91   Temp 98.9 F (37.2 C)   Ht 5' 9.02" (1.753 m)   LMP 05/01/2014 (Approximate)   SpO2 98%   BMI 26.48 kg/m   BP Readings from Last 3 Encounters:  08/31/20 122/70  04/23/20 100/74  03/24/20 120/72    Wt Readings from Last 3 Encounters:  04/23/20 179 lb 6.4 oz (81.4 kg)  03/24/20 181 lb 6.4 oz (82.3 kg)  06/18/19 183 lb 3.2 oz (83.1 kg)    General appearance: alert, cooperative and appears stated age Ears: normal TM's and external ear canals both ears Throat: lips, mucosa, and tongue normal; teeth and gums normal Neck: no adenopathy, no carotid bruit, supple, symmetrical, trachea midline and thyroid not enlarged, symmetric, no tenderness/mass/nodules Back: symmetric, no curvature. ROM normal. No CVA tenderness. Lungs: clear to auscultation bilaterally Heart: regular rate and rhythm, S1, S2 normal, no murmur, click, rub or gallop Abdomen: soft, non-tender; bowel sounds normal; no masses,  no organomegaly Pulses: 2+ and symmetric Skin: Skin color, texture, turgor normal. No rashes or lesions Lymph nodes: Cervical, supraclavicular, and axillary nodes normal. Psych: affect normal, makes good eye contact. No fidgeting,  Smiles easily.  Speech non pressured and articulate. No tics. Denies suicidal thoughts   Lab Results  Component Value Date   HGBA1C 5.8 04/23/2020   HGBA1C 6.0 03/20/2019   HGBA1C 5.6 05/20/2018    Lab Results  Component Value Date   CREATININE 0.84 04/23/2020   CREATININE 0.80 03/20/2019   CREATININE 0.82 08/05/2018    Lab Results  Component Value Date   WBC 11.9 (H) 08/05/2018   HGB 14.3 08/05/2018   HCT 43.9 08/05/2018   PLT 399 08/05/2018   GLUCOSE 98 04/23/2020   CHOL 226 (H) 04/23/2020   TRIG 137.0 04/23/2020   HDL 52.80 04/23/2020   LDLDIRECT 155.0 01/22/2017   LDLCALC 146 (H) 04/23/2020   ALT 16 04/23/2020   AST 17 04/23/2020   NA 139 04/23/2020   K 4.2 04/23/2020    CL 102 04/23/2020   CREATININE 0.84 04/23/2020   BUN 12 04/23/2020   CO2 29 04/23/2020   TSH 0.97 04/23/2020   HGBA1C 5.8 04/23/2020   MICROALBUR 1.0 04/23/2020    MM 3D SCREEN BREAST BILATERAL  Result Date: 02/18/2020 CLINICAL DATA:  Screening. EXAM: DIGITAL SCREENING BILATERAL MAMMOGRAM WITH TOMO AND CAD COMPARISON:  Previous exam(s). ACR Breast Density Category c: The breast tissue is heterogeneously dense, which may obscure small masses. FINDINGS: There are no findings suspicious for malignancy. Images were processed with CAD. IMPRESSION: No mammographic evidence of malignancy. A result letter of this screening mammogram will be mailed directly to the patient. RECOMMENDATION: Screening mammogram in one year. (Code:SM-B-01Y) BI-RADS CATEGORY  1: Negative. Electronically Signed   By: Valentino Saxon MD   On: 02/18/2020 09:05    Assessment & Plan:   Problem List  Items Addressed This Visit      Unprioritized   Goiter diffuse   Relevant Orders   TSH   Hyperlipidemia LDL goal <130 - Primary   Relevant Orders   Comprehensive metabolic panel   Lipid panel   Generalized anxiety disorder    Increased stress level due to increased responsibility caring for aging parents.  Somatic manifestations reviewed.  Advised to increase prozac dose to 20 mg daily       Relevant Medications   FLUoxetine (PROZAC) 20 MG capsule   Insomnia secondary to anxiety    Managed with elavil and ambien.  No changes today.  The risks and benefits of ambien use  were discussed with patient today including excessive sedation leading to respiratory depression,  impaired thinking/driving, and dependence .  Patient was advised to avoid concurrent use with alcohol, to use medication only as needed and not to share with others  .       Relevant Medications   FLUoxetine (PROZAC) 20 MG capsule   Essential hypertension, benign    Well controlled on current regimen of losartan 100 mg daily and spironolactone 25 mg  daily Renal function normal in November , no changes today.  Lab Results  Component Value Date   CREATININE 0.84 04/23/2020   Lab Results  Component Value Date   NA 139 04/23/2020   K 4.2 04/23/2020   CL 102 04/23/2020   CO2 29 04/23/2020         Prediabetes   Relevant Orders   Hemoglobin A1c      I have changed Anntionette C. Angulo's FLUoxetine. I am also having her maintain her cetirizine, Lactobacillus (PROBIOTIC ACIDOPHILUS PO), docusate sodium, cholecalciferol, ZINC-VITAMIN C PO, Multiple Vitamin (MULTI-VITAMIN DAILY PO), Dapsone, linaclotide, omeprazole, zolpidem, losartan, spironolactone, clonazePAM, and amitriptyline.  Meds ordered this encounter  Medications  . FLUoxetine (PROZAC) 20 MG capsule    Sig: Take 1 capsule (20 mg total) by mouth daily.    Dispense:  90 capsule    Refill:  1    Medications Discontinued During This Encounter  Medication Reason  . FLUoxetine (PROZAC) 10 MG capsule     Follow-up: Return in about 3 months (around 12/01/2020).   Crecencio Mc, MD

## 2020-08-31 NOTE — Patient Instructions (Signed)
Increase your Prozac to 20 mg daily.  New rx sent to [facility]

## 2020-09-02 NOTE — Assessment & Plan Note (Signed)
Managed with elavil and ambien.  No changes today.  The risks and benefits of ambien use  were discussed with patient today including excessive sedation leading to respiratory depression,  impaired thinking/driving, and dependence .  Patient was advised to avoid concurrent use with alcohol, to use medication only as needed and not to share with others  .  

## 2020-09-02 NOTE — Assessment & Plan Note (Signed)
Increased stress level due to increased responsibility caring for aging parents.  Somatic manifestations reviewed.  Advised to increase prozac dose to 20 mg daily

## 2020-09-02 NOTE — Assessment & Plan Note (Signed)
Well controlled on current regimen of losartan 100 mg daily and spironolactone 25 mg daily Renal function normal in November , no changes today.  Lab Results  Component Value Date   CREATININE 0.84 04/23/2020   Lab Results  Component Value Date   NA 139 04/23/2020   K 4.2 04/23/2020   CL 102 04/23/2020   CO2 29 04/23/2020

## 2020-09-14 ENCOUNTER — Other Ambulatory Visit: Payer: Self-pay | Admitting: Internal Medicine

## 2020-09-14 DIAGNOSIS — K5904 Chronic idiopathic constipation: Secondary | ICD-10-CM

## 2020-09-24 ENCOUNTER — Encounter: Payer: Self-pay | Admitting: Obstetrics and Gynecology

## 2020-10-07 ENCOUNTER — Encounter: Payer: Self-pay | Admitting: Obstetrics and Gynecology

## 2020-10-07 ENCOUNTER — Ambulatory Visit (INDEPENDENT_AMBULATORY_CARE_PROVIDER_SITE_OTHER): Payer: 59 | Admitting: Obstetrics and Gynecology

## 2020-10-07 ENCOUNTER — Other Ambulatory Visit: Payer: Self-pay

## 2020-10-07 ENCOUNTER — Other Ambulatory Visit (HOSPITAL_COMMUNITY)
Admission: RE | Admit: 2020-10-07 | Discharge: 2020-10-07 | Disposition: A | Discharge status: Home / Self Care | Payer: 59 | Source: Ambulatory Visit | Attending: Obstetrics and Gynecology | Admitting: Obstetrics and Gynecology

## 2020-10-07 VITALS — BP 103/70 | HR 102 | Ht 69.02 in | Wt 187.2 lb

## 2020-10-07 DIAGNOSIS — Z124 Encounter for screening for malignant neoplasm of cervix: Secondary | ICD-10-CM | POA: Diagnosis present

## 2020-10-07 DIAGNOSIS — R7303 Prediabetes: Secondary | ICD-10-CM

## 2020-10-07 DIAGNOSIS — Z1231 Encounter for screening mammogram for malignant neoplasm of breast: Secondary | ICD-10-CM

## 2020-10-07 DIAGNOSIS — Z01419 Encounter for gynecological examination (general) (routine) without abnormal findings: Secondary | ICD-10-CM | POA: Diagnosis not present

## 2020-10-07 DIAGNOSIS — E785 Hyperlipidemia, unspecified: Secondary | ICD-10-CM

## 2020-10-07 DIAGNOSIS — Z8049 Family history of malignant neoplasm of other genital organs: Secondary | ICD-10-CM

## 2020-10-07 DIAGNOSIS — Z86018 Personal history of other benign neoplasm: Secondary | ICD-10-CM

## 2020-10-07 DIAGNOSIS — Z8371 Family history of colonic polyps: Secondary | ICD-10-CM

## 2020-10-07 NOTE — Progress Notes (Signed)
Annual pt present for well woman visit. Pt stated that she was doing well.

## 2020-10-07 NOTE — Patient Instructions (Signed)
Preventive Care 84-57 Years Old, Female Preventive care refers to lifestyle choices and visits with your health care provider that can promote health and wellness. This includes:  A yearly physical exam. This is also called an annual wellness visit.  Regular dental and eye exams.  Immunizations.  Screening for certain conditions.  Healthy lifestyle choices, such as: ? Eating a healthy diet. ? Getting regular exercise. ? Not using drugs or products that contain nicotine and tobacco. ? Limiting alcohol use. What can I expect for my preventive care visit? Physical exam Your health care provider will check your:  Height and weight. These may be used to calculate your BMI (body mass index). BMI is a measurement that tells if you are at a healthy weight.  Heart rate and blood pressure.  Body temperature.  Skin for abnormal spots. Counseling Your health care provider may ask you questions about your:  Past medical problems.  Family's medical history.  Alcohol, tobacco, and drug use.  Emotional well-being.  Home life and relationship well-being.  Sexual activity.  Diet, exercise, and sleep habits.  Work and work Statistician.  Access to firearms.  Method of birth control.  Menstrual cycle.  Pregnancy history. What immunizations do I need? Vaccines are usually given at various ages, according to a schedule. Your health care provider will recommend vaccines for you based on your age, medical history, and lifestyle or other factors, such as travel or where you work.   What tests do I need? Blood tests  Lipid and cholesterol levels. These may be checked every 5 years, or more often if you are over 3 years old.  Hepatitis C test.  Hepatitis B test. Screening  Lung cancer screening. You may have this screening every year starting at age 73 if you have a 30-pack-year history of smoking and currently smoke or have quit within the past 15 years.  Colorectal cancer  screening. ? All adults should have this screening starting at age 52 and continuing until age 17. ? Your health care provider may recommend screening at age 49 if you are at increased risk. ? You will have tests every 1-10 years, depending on your results and the type of screening test.  Diabetes screening. ? This is done by checking your blood sugar (glucose) after you have not eaten for a while (fasting). ? You may have this done every 1-3 years.  Mammogram. ? This may be done every 1-2 years. ? Talk with your health care provider about when you should start having regular mammograms. This may depend on whether you have a family history of breast cancer.  BRCA-related cancer screening. This may be done if you have a family history of breast, ovarian, tubal, or peritoneal cancers.  Pelvic exam and Pap test. ? This may be done every 3 years starting at age 10. ? Starting at age 11, this may be done every 5 years if you have a Pap test in combination with an HPV test. Other tests  STD (sexually transmitted disease) testing, if you are at risk.  Bone density scan. This is done to screen for osteoporosis. You may have this scan if you are at high risk for osteoporosis. Talk with your health care provider about your test results, treatment options, and if necessary, the need for more tests. Follow these instructions at home: Eating and drinking  Eat a diet that includes fresh fruits and vegetables, whole grains, lean protein, and low-fat dairy products.  Take vitamin and mineral supplements  as recommended by your health care provider.  Do not drink alcohol if: ? Your health care provider tells you not to drink. ? You are pregnant, may be pregnant, or are planning to become pregnant.  If you drink alcohol: ? Limit how much you have to 0-1 drink a day. ? Be aware of how much alcohol is in your drink. In the [place] one drink equals one 12 oz bottle of beer (355 mL), one 5 oz glass of  wine (148 mL), or one 1 oz glass of hard liquor (44 mL).   Lifestyle  Take daily care of your teeth and gums. Brush your teeth every morning and night with fluoride toothpaste. Floss one time each day.  Stay active. Exercise for at least 30 minutes 5 or more days each week.  Do not use any products that contain nicotine or tobacco, such as cigarettes, e-cigarettes, and chewing tobacco. If you need help quitting, ask your health care provider.  Do not use drugs.  If you are sexually active, practice safe sex. Use a condom or other form of protection to prevent STIs (sexually transmitted infections).  If you do not wish to become pregnant, use a form of birth control. If you plan to become pregnant, see your health care provider for a prepregnancy visit.  If told by your health care provider, take low-dose aspirin daily starting at age 48.  Find healthy ways to cope with stress, such as: ? Meditation, yoga, or listening to music. ? Journaling. ? Talking to a trusted person. ? Spending time with friends and family. Safety  Always wear your seat belt while driving or riding in a vehicle.  Do not drive: ? If you have been drinking alcohol. Do not ride with someone who has been drinking. ? When you are tired or distracted. ? While texting.  Wear a helmet and other protective equipment during sports activities.  If you have firearms in your house, make sure you follow all gun safety procedures. What's next?  Visit your health care provider once a year for an annual wellness visit.  Ask your health care provider how often you should have your eyes and teeth checked.  Stay up to date on all vaccines. This information is not intended to replace advice given to you by your health care provider. Make sure you discuss any questions you have with your health care provider. Document Revised: 02/24/2020 Document Reviewed: 01/31/2018 Elsevier Patient Education  2021 [place] Breast self-awareness means being familiar with how your breasts look and feel. It involves checking your breasts regularly and reporting any changes to your health care provider. Practicing breast self-awareness is important. Sometimes changes may not be harmful (are benign), but sometimes a change in your breasts can be a sign of a serious medical problem. It is important to learn how to do this procedure correctly so that you can catch problems early, when treatment is more likely to be successful. All women should practice breast self-awareness, including women who have had breast implants. What you need:  A mirror.  A well-lit room. How to do a breast self-exam A breast self-exam is one way to learn what is normal for your breasts and whether your breasts are changing. To do a breast self-exam: Look for changes 1. Remove all the clothing above your waist. 2. Stand in front of a mirror in a room with good lighting. 3. Put your hands on your hips. 4. Push  your hands firmly downward. 5. Compare your breasts in the mirror. Look for differences between them (asymmetry), such as: ? Differences in shape. ? Differences in size. ? Puckers, dips, and bumps in one breast and not the other. 6. Look at each breast for changes in the skin, such as: ? Redness. ? Scaly areas. 7. Look for changes in your nipples, such as: ? Discharge. ? Bleeding. ? Dimpling. ? Redness. ? A change in position.   Feel for changes Carefully feel your breasts for lumps and changes. It is best to do this while lying on your back on the floor, and again while sitting or standing in the tub or shower with soapy water on your skin. Feel each breast in the following way: 1. Place the arm on the side of the breast you are examining above your head. 2. Feel your breast with the other hand. 3. Start in the nipple area and make -inch (2 cm) overlapping circles to feel your breast. Use the  pads of your three middle fingers to do this. Apply light pressure, then medium pressure, then firm pressure. The light pressure will allow you to feel the tissue closest to the skin. The medium pressure will allow you to feel the tissue that is a little deeper. The firm pressure will allow you to feel the tissue close to the ribs. 4. Continue the overlapping circles, moving downward over the breast until you feel your ribs below your breast. 5. Move one finger-width toward the center of the body. Continue to use the -inch (2 cm) overlapping circles to feel your breast as you move slowly up toward your collarbone. 6. Continue the up-and-down exam using all three pressures until you reach your armpit.   Write down what you find Writing down what you find can help you remember what to discuss with your health care provider. Write down:  What is normal for each breast.  Any changes that you find in each breast, including: ? The kind of changes you find. ? Any pain or tenderness. ? Size and location of any lumps.  Where you are in your menstrual cycle, if you are still menstruating. General tips and recommendations  Examine your breasts every month.  If you are breastfeeding, the best time to examine your breasts is after a feeding or after using a breast pump.  If you menstruate, the best time to examine your breasts is 5-7 days after your period. Breasts are generally lumpier during menstrual periods, and it may be more difficult to notice changes.  With time and practice, you will become more familiar with the variations in your breasts and more comfortable with the exam. Contact a health care provider if you:  See a change in the shape or size of your breasts or nipples.  See a change in the skin of your breast or nipples, such as a reddened or scaly area.  Have unusual discharge from your nipples.  Find a lump or thick area that was not there before.  Have pain in your  breasts.  Have any concerns related to your breast health. Summary  Breast self-awareness includes looking for physical changes in your breasts, as well as feeling for any changes within your breasts.  Breast self-awareness should be performed in front of a mirror in a well-lit room.  You should examine your breasts every month. If you menstruate, the best time to examine your breasts is 5-7 days after your menstrual period.  Let your health  care provider know of any changes you notice in your breasts, including changes in size, changes on the skin, pain or tenderness, or unusual fluid from your nipples. This information is not intended to replace advice given to you by your health care provider. Make sure you discuss any questions you have with your health care provider. Document Revised: 01/08/2018 Document Reviewed: 01/08/2018 Elsevier Patient Education  2021 Elsevier Inc.   

## 2020-10-07 NOTE — Progress Notes (Signed)
ANNUAL PREVENTATIVE CARE GYNECOLOGY  ENCOUNTER NOTE  Subjective:       Dawn Griffith is a 55 y.o. G22P0020 married female here for a routine annual gynecologic exam. She denies any major complaints today. The patient is not taking hormone replacement therapy. Patient denies post-menopausal vaginal bleeding. The patient wears seatbelts: yes. Is the patient participating in exercise: yes (purchased a [employer] this week).     Gynecologic History Patient's last menstrual period was 05/01/2014 (approximate). Contraception: post menopausal status Last Pap: 01/2017. Results were: normal Last mammogram: 02/14/2021. Results were: normal Last Colonoscopy: 2018 (per patient).  Normal. Has q 5 year surveillance for family history.      Obstetric History OB History  Gravida Para Term Preterm AB Living  3 1     2     SAB IAB Ectopic Multiple Live Births  1   1        # Outcome Date GA Lbr Len/2nd Weight Sex Delivery Anes PTL Lv  3 SAB           2 Ectopic           1 Para             Past Medical History:  Diagnosis Date  . Anxiety 12/30/2010  . Cancer [facility])    as a child  . Chronic constipation   . Family history of FAP (familial adenomatous polyposis) 07/07/2014   5 yr interval screening Last colonoscopy 2018  . Fibroid uterus 07/07/2014   Noted during pelvic ultrasound in 2014 for fullness on exam   . Goiter diffuse 03/05/2014   Repeat ultrasound of thyroid is unchanged.    . Hyperlipidemia LDL goal <130 11/07/2013  . Hypertension   . 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 Overweight (BMI 25.0-29.9) 11/04/2012   Goal weight for BMI < 25 based on height of 5'9.5" is < 170 lbs Body mass index is 27.52 kg/m.   Marland Kitchen Prediabetes 01/02/2016  . Vitamin D deficiency 07/04/2016    Family History  Problem Relation Age of Onset  . Arrhythmia Mother   . Hypertension Mother   . Colon polyps Mother        s/p colectomy   . Kidney disease Mother        CKD Stage 4  . Uterine cancer Mother   .  Diabetes Mother   . Hypertension Father   . Diabetes Father   . Hypertension Brother   . Breast cancer Neg Hx     Past Surgical History:  Procedure Laterality Date  . CESAREAN SECTION  1992  . CHOLECYSTECTOMY  Dec 2012   [facility]  . COLONOSCOPY    . [place]  . LYMPH NODE BIOPSY    . NASAL SINUS SURGERY      Social History   Socioeconomic History  . Marital status: Married    Spouse name: [name]  . Number of children: 1  . Years of education: 52  . Highest education level: Not on file  Occupational History  . 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  Tobacco Use  . Smoking status: Never Smoker  . Smokeless tobacco: Never Used  Vaping Use  . Vaping Use: Never used  Substance and Sexual Activity  . Alcohol use: No    Alcohol/week: 0.0 standard drinks  . Drug use: No  . Sexual activity: Yes    Birth control/protection: Post-menopausal  Other Topics Concern  . Not  on file  Social History Narrative  . Not on file   Social Determinants of Health   Financial Resource Strain: Not on file  Food Insecurity: Not on file  Transportation Needs: Not on file  Physical Activity: Not on file  Stress: Not on file  Social Connections: Not on file  Intimate Partner Violence: Not on file    Current Outpatient Medications on File Prior to Visit  Medication Sig Dispense Refill  . amitriptyline (ELAVIL) 100 MG tablet TAKE ONE TABLET BY MOUTH EVERY NIGHT AT BEDTIME 90 tablet 1  . cetirizine (ZYRTEC) 10 MG tablet Take 10 mg by mouth daily.    . cholecalciferol (VITAMIN D) 1000 units tablet Take 1,000 Units by mouth daily.    . clonazePAM (KLONOPIN) 0.5 MG tablet TAKE 1/2 TABLET BY MOUTH TWICE A DAY AS NEEDED FOR ANXIETY 60 tablet 2  . Dapsone 7.5 % GEL APPLY A THIN LAYER TOPICALLY TO ENTIRE FACE DAILY 60 g 1  . docusate sodium (COLACE) 100 MG capsule Take 100 mg by mouth 2 (two) times daily.    Marland Kitchen FLUoxetine (PROZAC) 20 MG capsule Take 1  capsule (20 mg total) by mouth daily. 90 capsule 1  . Lactobacillus (PROBIOTIC ACIDOPHILUS PO) Take 1 tablet by mouth daily.    Marland Kitchen LINZESS 290 MCG CAPS capsule TAKE ONE CAPSULE BY MOUTH EVERY MORNING BEFORE MEALS 30 capsule 1  . losartan (COZAAR) 100 MG tablet TAKE ONE TABLET BY MOUTH DAILY 90 tablet 0  . Multiple Vitamin (MULTI-VITAMIN DAILY PO) Take by mouth.    Marland Kitchen omeprazole (PRILOSEC) 40 MG capsule TAKE ONE CAPSULE BY MOUTH DAILY 90 capsule 3  . spironolactone (ALDACTONE) 25 MG tablet TAKE ONE TABLET BY MOUTH DAILY 90 tablet 0  . ZINC-VITAMIN C PO Take by mouth.    . zolpidem (AMBIEN CR) 12.5 MG CR tablet TAKE ONE TABLET BY MOUTH EVERY NIGHT AT BEDTIME AS NEEDED FOR SLEEP 30 tablet 5   No current facility-administered medications on file prior to visit.    No Known Allergies    Review of Systems ROS Review of Systems - General ROS: negative for - chills, fatigue, fever, hot flashes, night sweats, weight gain or weight loss Psychological ROS: negative for - anxiety, decreased libido, depression, mood swings, physical abuse or sexual abuse Ophthalmic ROS: negative for - blurry vision, eye pain or loss of vision ENT ROS: negative for - headaches, hearing change, visual changes or vocal changes Allergy and Immunology ROS: negative for - hives, itchy/watery eyes or seasonal allergies Hematological and Lymphatic ROS: negative for - bleeding problems, bruising, swollen lymph nodes or weight loss Endocrine ROS: negative for - galactorrhea, hair pattern changes, hot flashes, malaise/lethargy, mood swings, palpitations, polydipsia/polyuria, skin changes, temperature intolerance or unexpected weight changes Breast ROS: negative for - new or changing breast lumps or nipple discharge Respiratory ROS: negative for - cough or shortness of breath Cardiovascular ROS: negative for - chest pain, irregular heartbeat, palpitations or shortness of breath Gastrointestinal ROS: no abdominal pain, change in  bowel habits, or black or bloody stools Genito-Urinary ROS: no dysuria, trouble voiding, or hematuria Musculoskeletal ROS: negative for - joint pain or joint stiffness Neurological ROS: negative for - bowel and bladder control changes Dermatological ROS: negative for rash and skin lesion changes   Objective:   BP 103/70   Pulse (!) 102   Ht 5' 9.02" (1.753 m)   Wt 187 lb 3.2 oz (84.9 kg)   LMP 05/01/2014 (Approximate)   BMI 27.63  kg/m  CONSTITUTIONAL: Well-developed, well-nourished female in no acute distress. Overweight PSYCHIATRIC: Normal mood and affect. Normal behavior. Normal judgment and thought content. [place]: Alert and oriented to person, place, and time. Normal muscle tone coordination. No cranial nerve deficit noted. HENT:  Normocephalic, atraumatic, External right and left ear normal. Oropharynx is clear and moist EYES: Conjunctivae and EOM are normal. Pupils are equal, round, and reactive to light. No scleral icterus.  NECK: Normal range of motion, supple, no masses.  Normal thyroid.  SKIN: Skin is warm and dry. No rash noted. Not diaphoretic. No erythema. No pallor. CARDIOVASCULAR: Normal heart rate noted, regular rhythm, no murmur. RESPIRATORY: Clear to auscultation bilaterally. Effort and breath sounds normal, no problems with respiration noted. BREASTS: Symmetric in size. No masses, skin changes, nipple drainage, or lymphadenopathy. ABDOMEN: Soft, normal bowel sounds, no distention noted.  No tenderness, rebound or guarding.  BLADDER: Normal PELVIC:  Bladder no bladder distension noted  Urethra: normal appearing urethra with no masses, tenderness or lesions  Vulva: normal appearing vulva with no masses, tenderness or lesions  Vagina: atrophic. No lesions or discharge  Cervix: normal appearing cervix without discharge or lesions  Uterus: uterus is normal size, shape, consistency and nontender  Adnexa: normal adnexa in size, nontender and no masses  RV: External  Exam NormaI, No Rectal Masses and Normal Sphincter tone  MUSCULOSKELETAL: Normal range of motion. No tenderness.  No cyanosis, clubbing, or edema.  2+ distal pulses. LYMPHATIC: No Axillary, Supraclavicular, or Inguinal Adenopathy.   Labs: Lab Results  Component Value Date   WBC 11.9 (H) 08/05/2018   HGB 14.3 08/05/2018   HCT 43.9 08/05/2018   MCV 91.8 08/05/2018   PLT 399 08/05/2018    Lab Results  Component Value Date   CREATININE 0.84 04/23/2020   BUN 12 04/23/2020   NA 139 04/23/2020   K 4.2 04/23/2020   CL 102 04/23/2020   CO2 29 04/23/2020    Lab Results  Component Value Date   ALT 16 04/23/2020   AST 17 04/23/2020   ALKPHOS 78 04/23/2020   BILITOT 0.7 04/23/2020    Lab Results  Component Value Date   CHOL 226 (H) 04/23/2020   HDL 52.80 04/23/2020   LDLCALC 146 (H) 04/23/2020   LDLDIRECT 155.0 01/22/2017   TRIG 137.0 04/23/2020   CHOLHDL 4 04/23/2020    Lab Results  Component Value Date   TSH 0.97 04/23/2020    Lab Results  Component Value Date   HGBA1C 5.8 04/23/2020     Assessment:   1. Encounter for well woman exam with routine gynecological exam   2. Pap smear for cervical cancer screening   3. Breast cancer screening by mammogram   4. Family history of uterine cancer   5. Family history of FAP (familial adenomatous polyposis)   6. Dyslipidemia   7. History of uterine fibroid   8. Prediabetes     Plan:  - Pap: Not needed. Up to date.  - Mammogram: Not Ordered. Up to date.  - Stool Guaiac Testing:  Not Ordered. Up to date. Due in 1 year due to family history of FAP.  - Labs: Up to date. Normally performed with PCP.  - Routine preventative health maintenance measures emphasized: Exercise/Diet/Weight control, Tobacco Warnings, Alcohol/Substance use risks, Stress Management, Peer Pressure Issues and Safe Sex.   - Up to date on COVID and shingles vaccinations.  - Comorbidities managed by PCP.  - Patient has a family history of uterine  cancer and  FAP. Has had negative genetic screening.  - Reiterated that there were no exact screening tests for endometrial cancer, but surveillance can be performed with endometrial biopsy, and possible ultrasound if symptomatic. Discussed warning signs (any PMB, abnormal discharge) that should warrant further investigation.  - History of uterine fibroids, stable, asymptomatic.  - Vaginal atrophy stable.  - Return to Drew, MD  [facility]

## 2020-10-13 LAB — CYTOLOGY - PAP
Comment: NEGATIVE
Diagnosis: NEGATIVE
High risk HPV: NEGATIVE

## 2020-10-22 ENCOUNTER — Other Ambulatory Visit: Payer: Self-pay | Admitting: Internal Medicine

## 2020-11-27 ENCOUNTER — Other Ambulatory Visit: Payer: Self-pay | Admitting: Internal Medicine

## 2020-11-27 DIAGNOSIS — K5904 Chronic idiopathic constipation: Secondary | ICD-10-CM

## 2020-12-01 ENCOUNTER — Other Ambulatory Visit: Payer: Self-pay

## 2020-12-01 ENCOUNTER — Ambulatory Visit (INDEPENDENT_AMBULATORY_CARE_PROVIDER_SITE_OTHER): Payer: 59 | Admitting: Internal Medicine

## 2020-12-01 ENCOUNTER — Encounter: Payer: Self-pay | Admitting: Internal Medicine

## 2020-12-01 DIAGNOSIS — E040 Nontoxic diffuse goiter: Secondary | ICD-10-CM

## 2020-12-01 DIAGNOSIS — E785 Hyperlipidemia, unspecified: Secondary | ICD-10-CM | POA: Diagnosis not present

## 2020-12-01 DIAGNOSIS — F5105 Insomnia due to other mental disorder: Secondary | ICD-10-CM

## 2020-12-01 DIAGNOSIS — F411 Generalized anxiety disorder: Secondary | ICD-10-CM

## 2020-12-01 DIAGNOSIS — R7303 Prediabetes: Secondary | ICD-10-CM | POA: Diagnosis not present

## 2020-12-01 DIAGNOSIS — E049 Nontoxic goiter, unspecified: Secondary | ICD-10-CM | POA: Diagnosis not present

## 2020-12-01 DIAGNOSIS — K5909 Other constipation: Secondary | ICD-10-CM

## 2020-12-01 DIAGNOSIS — F419 Anxiety disorder, unspecified: Secondary | ICD-10-CM

## 2020-12-01 DIAGNOSIS — E663 Overweight: Secondary | ICD-10-CM

## 2020-12-01 LAB — LIPID PANEL
Cholesterol: 274 mg/dL — ABNORMAL HIGH (ref 0–200)
HDL: 54.7 mg/dL (ref >39.00)
LDL Cholesterol: 193 mg/dL — ABNORMAL HIGH (ref 0–99)
NonHDL: 219.35
Total CHOL/HDL Ratio: 5
Triglycerides: 131 mg/dL (ref 0.0–149.0)
VLDL: 26.2 mg/dL (ref 0.0–40.0)

## 2020-12-01 LAB — HEMOGLOBIN A1C: Hgb A1c MFr Bld: 5.9 % (ref 4.6–6.5)

## 2020-12-01 LAB — COMPREHENSIVE METABOLIC PANEL
ALT: 22 U/L (ref 0–35)
AST: 19 U/L (ref 0–37)
Albumin: 4.8 g/dL (ref 3.5–5.2)
Alkaline Phosphatase: 89 U/L (ref 39–117)
BUN: 14 mg/dL (ref 6–23)
CO2: 28 mEq/L (ref 19–32)
Calcium: 10.2 mg/dL (ref 8.4–10.5)
Chloride: 101 mEq/L (ref 96–112)
Creatinine, Ser: 0.87 mg/dL (ref 0.40–1.20)
GFR: 74.16 mL/min (ref >60.00)
Glucose, Bld: 102 mg/dL — ABNORMAL HIGH (ref 70–99)
Potassium: 4.4 mEq/L (ref 3.5–5.1)
Sodium: 138 mEq/L (ref 135–145)
Total Bilirubin: 0.5 mg/dL (ref 0.2–1.2)
Total Protein: 7.4 g/dL (ref 6.0–8.3)

## 2020-12-01 LAB — TSH: TSH: 1.21 u[IU]/mL (ref 0.35–4.50)

## 2020-12-01 MED ORDER — ZOLPIDEM TARTRATE ER 12.5 MG PO TBCR: EXTENDED_RELEASE_TABLET | ORAL | 5 refills | Status: DC

## 2020-12-01 MED ORDER — CLONAZEPAM 0.5 MG PO TABS: 0.5000 mg | ORAL_TABLET | Freq: Every day | ORAL | 5 refills | Status: DC | PRN

## 2020-12-01 NOTE — Patient Instructions (Signed)
I recommend a trial of 30 mg prozac daily  Limit clonazepam to 1 tablet daily  Try taking the elavil one hour before bedtime

## 2020-12-01 NOTE — Progress Notes (Signed)
Subjective:  Patient ID: Dawn Griffith, female    DOB: [birth year]  Age: 55 y.o. MRN: [id number]  CC: Diagnoses of Hyperlipidemia LDL goal <130, Goiter diffuse, Prediabetes, Chronic constipation, Generalized anxiety disorder, Insomnia secondary to anxiety, and Overweight (BMI 25.0-29.9) were pertinent to this visit.  HPI Dawn Griffith presents for  3 MONTH FOLLOW UP ON chronic conditions including  GAD,  GOITER , CHRONIC CONSTIPATION, PREDIABETES WITH OBESITY  This visit occurred during the SARS-CoV-2 public health emergency.  Safety protocols were in place, including screening questions prior to the visit, additional usage of staff PPE, and extensive cleaning of exam room while observing appropriate contact time as indicated for disinfecting solutions.     ANXIETY:  aggravated by family issues both at home and at work:  "I have to be a parent again"  [place] RETURNED, he has resumed  [place]. SHE AND husband [name] ARE  taking him and her mother to the beach for a week.  Will be the first time they have left [place] (mother in law) home alone.  She has been experiencing occasional insomnia  both involving initiation and maintenance despite taking 100 mg elavil and mbien CR 12.5 mg at bedtime .has been using clonazepam full tablet once daily and 20 mg prozac  (not 30)  daily.  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 She is pleased and proud that her daughter [name] Veverly [place]ells)  has graduated from [employer] and has accepted a job offer locally.   Overweight:  she is exercising occasionally and trying to stop eating after 6 pm .  Has given up soft drinks   Chronic constipation: using Linzess daily and Activia daily probiotic drink ([employer])  along with colace,  getting over 60 ounces of flavored seltzer or Propel daily   Outpatient Medications Prior to Visit  Medication Sig Dispense Refill    amitriptyline (ELAVIL) 100 MG tablet TAKE ONE TABLET BY MOUTH EVERY NIGHT AT BEDTIME 90 tablet 1   cetirizine (ZYRTEC) 10 MG tablet Take 10 mg by mouth daily.     cholecalciferol (VITAMIN D) 1000 units tablet Take 1,000 Units by mouth daily.     docusate sodium (COLACE) 100 MG capsule Take 100 mg by mouth 2 (two) times daily.     FLUoxetine (PROZAC) 10 MG capsule TAKE ONE CAPSULE BY MOUTH DAILY 90 capsule 1   FLUoxetine (PROZAC) 20 MG capsule Take 1 capsule (20 mg total) by mouth daily. 90 capsule 1   Lactobacillus (PROBIOTIC ACIDOPHILUS PO) Take 1 tablet by mouth daily.     LINZESS 290 MCG CAPS capsule TAKE ONE CAPSULE BY MOUTH EVERY MORNING BEFORE A MEAL 30 capsule 1   losartan (COZAAR) 100 MG tablet TAKE ONE TABLET BY MOUTH DAILY 90 tablet 0   Multiple Vitamin (MULTI-VITAMIN DAILY PO) Take by mouth.     omeprazole (PRILOSEC) 40 MG capsule TAKE ONE CAPSULE BY MOUTH DAILY 90 capsule 3   spironolactone (ALDACTONE) 25 MG tablet TAKE ONE TABLET BY MOUTH DAILY 90 tablet 0   ZINC-VITAMIN C PO Take by mouth.     clonazePAM (KLONOPIN) 0.5 MG tablet TAKE 1/2 TABLET BY MOUTH TWICE A DAY AS NEEDED FOR ANXIETY 60 tablet 2   zolpidem (AMBIEN CR) 12.5 MG CR tablet TAKE ONE TABLET BY MOUTH EVERY NIGHT AT BEDTIME AS NEEDED FOR SLEEP 30 tablet 5  Dapsone 7.5 % GEL APPLY A THIN LAYER TOPICALLY TO ENTIRE FACE DAILY (Patient not taking: Reported on 12/01/2020) 60 g 1   No facility-administered medications prior to visit.    Review of Systems;  Patient denies headache, fevers, malaise, unintentional weight loss, skin rash, eye pain, sinus congestion and sinus pain, sore throat, dysphagia,  hemoptysis , cough, dyspnea, wheezing, chest pain, palpitations, orthopnea, edema, abdominal pain, nausea, melena, diarrhea,  flank pain, dysuria, hematuria, urinary  Frequency, nocturia, numbness, tingling, seizures,  Focal weakness, Loss of consciousness,  Tremor,depression, and suicidal ideation.      Objective:  BP  112/76   Pulse (!) 102   Temp (!) 96.3 F (35.7 C) (Temporal)   Ht 5\' 9"  (1.753 m)   Wt 183 lb 6.4 oz (83.2 kg)   LMP 05/01/2014 (Approximate)   SpO2 97%   BMI 27.08 kg/m   BP Readings from Last 3 Encounters:  12/01/20 112/76  10/07/20 103/70  08/31/20 122/70    Wt Readings from Last 3 Encounters:  12/01/20 183 lb 6.4 oz (83.2 kg)  10/07/20 187 lb 3.2 oz (84.9 kg)  04/23/20 179 lb 6.4 oz (81.4 kg)    General appearance: alert, cooperative and appears stated age Ears: normal TM's and external ear canals both ears Throat: lips, mucosa, and tongue normal; teeth and gums normal Neck: no adenopathy, no carotid bruit, supple, symmetrical, trachea midline and thyroid not enlarged, symmetric, no tenderness/mass/nodules Back: symmetric, no curvature. ROM normal. No CVA tenderness. Lungs: clear to auscultation bilaterally Heart: regular rate and rhythm, S1, S2 normal, no murmur, click, rub or gallop Abdomen: soft, non-tender; bowel sounds normal; no masses,  no organomegaly Pulses: 2+ and symmetric Skin: Skin color, texture, turgor normal. No rashes or lesions Lymph nodes: Cervical, supraclavicular, and axillary nodes normal. Psych: affect normal, makes good eye contact. No fidgeting,  Smiles easily.  Denies suicidal thoughts    Lab Results  Component Value Date   HGBA1C 5.9 12/01/2020   HGBA1C 5.8 04/23/2020   HGBA1C 6.0 03/20/2019    Lab Results  Component Value Date   CREATININE 0.87 12/01/2020   CREATININE 0.84 04/23/2020   CREATININE 0.80 03/20/2019    Lab Results  Component Value Date   WBC 11.9 (H) 08/05/2018   HGB 14.3 08/05/2018   HCT 43.9 08/05/2018   PLT 399 08/05/2018   GLUCOSE 102 (H) 12/01/2020   CHOL 274 (H) 12/01/2020   TRIG 131.0 12/01/2020   HDL 54.70 12/01/2020   LDLDIRECT 155.0 01/22/2017   LDLCALC 193 (H) 12/01/2020   ALT 22 12/01/2020   AST 19 12/01/2020   NA 138 12/01/2020   K 4.4 12/01/2020   CL 101 12/01/2020   CREATININE 0.87  12/01/2020   BUN 14 12/01/2020   CO2 28 12/01/2020   TSH 1.21 12/01/2020   HGBA1C 5.9 12/01/2020   MICROALBUR 1.0 04/23/2020    No results found.  Assessment & Plan:   Problem List Items Addressed This Visit       Unprioritized   Chronic constipation    Managed with lnizess, probiotics and colace.        Generalized anxiety disorder    Managed with prozac and clonazepam.  Advised to increase dose of prozac to 30 mg daily The risks and benefits of benzodiazepine use were reviewed with patient today including excessive sedation leading to respiratory depression,  impaired thinking/driving, and addiction.  Patient was advised to avoid concurrent use with alcohol, to use medication only as needed and not  to share with others  .        Goiter diffuse   Hyperlipidemia LDL goal <130   Insomnia secondary to anxiety    Managed with elavil and ambien.  No changes today.  The risks and benefits of ambien use  were discussed with patient today including excessive sedation leading to respiratory depression,  impaired thinking/driving, and dependence .  Patient was advised to avoid concurrent use with alcohol, to use medication only as needed and not to share with others  .        Overweight (BMI 25.0-29.9)    I have addressed  BMI and recommended a low glycemic index diet utilizing smaller more frequent meals to increase metabolism.  I have also recommended that patient start exercising with a goal of 30 minutes of aerobic exercise a minimum of 5 days per week.         Prediabetes    She has lowered her A1c with  A low  glycemic index diet and has intentionally lost weight with regular participation in an aerobic activity.  We will recheck an A1c annually   Lab Results  Component Value Date   HGBA1C 5.9 12/01/2020           I have changed Glendell C. Nesler's clonazePAM. I am also having her maintain her cetirizine, Lactobacillus (PROBIOTIC ACIDOPHILUS PO), docusate sodium,  cholecalciferol, ZINC-VITAMIN C PO, Multiple Vitamin (MULTI-VITAMIN DAILY PO), Dapsone, omeprazole, amitriptyline, FLUoxetine, losartan, spironolactone, FLUoxetine, Linzess, and zolpidem.  Meds ordered this encounter  Medications   clonazePAM (KLONOPIN) 0.5 MG tablet    Sig: Take 1 tablet (0.5 mg total) by mouth daily as needed for anxiety.    Dispense:  30 tablet    Refill:  5   zolpidem (AMBIEN CR) 12.5 MG CR tablet    Sig: TAKE ONE TABLET BY MOUTH EVERY NIGHT AT BEDTIME AS NEEDED FOR SLEEP    Dispense:  30 tablet    Refill:  5    Medications Discontinued During This Encounter  Medication Reason   zolpidem (AMBIEN CR) 12.5 MG CR tablet Reorder   clonazePAM (KLONOPIN) 0.5 MG tablet    I provided  30 minutes of  face-to-face time during this encounter reviewing patient's current  issues with insomnia, anxiety, and overweight , labs and imaging studies, providing counseling on the above mentioned problems , and coordination  of care .   Follow-up: Return in about 6 months (around 06/02/2021).   Crecencio Mc, MD

## 2020-12-04 NOTE — Assessment & Plan Note (Signed)
I have addressed  BMI and recommended a low glycemic index diet utilizing smaller more frequent meals to increase metabolism.  I have also recommended that patient start exercising with a goal of 30 minutes of aerobic exercise a minimum of 5 days per week.  

## 2020-12-04 NOTE — Assessment & Plan Note (Addendum)
Managed with prozac and clonazepam.  Advised to increase dose of prozac to 30 mg daily The risks and benefits of benzodiazepine use were reviewed with patient today including excessive sedation leading to respiratory depression,  impaired thinking/driving, and addiction.  Patient was advised to avoid concurrent use with alcohol, to use medication only as needed and not to share with others  .

## 2020-12-04 NOTE — Assessment & Plan Note (Signed)
Managed with lnizess, probiotics and colace.

## 2020-12-04 NOTE — Assessment & Plan Note (Signed)
She has lowered her A1c with  A low  glycemic index diet and has intentionally lost weight with regular participation in an aerobic activity.  We will recheck an A1c annually   Lab Results  Component Value Date   HGBA1C 5.9 12/01/2020

## 2020-12-04 NOTE — Assessment & Plan Note (Signed)
Managed with elavil and ambien.  No changes today.  The risks and benefits of ambien use  were discussed with patient today including excessive sedation leading to respiratory depression,  impaired thinking/driving, and dependence .  Patient was advised to avoid concurrent use with alcohol, to use medication only as needed and not to share with others  .  

## 2020-12-04 NOTE — Progress Notes (Signed)
I have reviewed your labs from your recent visit.  You looked great, by the way,  and the weight loss did not go unnoticed!   However,  You are still prediabetic and Your cholesterol  has risen, but is still  below the threshhold for treatment based on your ten year risk of heart attack or stroke being  Calculated as less than 8% (this was determined  by application of the American Heart Association Risk calculator to your current lipid panel and other medical history) .    I  applaud your efforts to follow a healthy lifestyle , and encourage you to increase your participation in exercise with a goal of  30 minutes of  low impact aerobic exercise daily .  Daily exercise will have a favorable effect on your anxiety level and your insomnia, as well ( once you have become convinced that it is not a luxury, but a priority for healthy living , despite your hectic life. )  Please plan to return in 6 months for follow up on these issues.    Regards,   Deborra Medina, MD

## 2020-12-19 ENCOUNTER — Other Ambulatory Visit: Payer: Self-pay | Admitting: Internal Medicine

## 2021-01-15 ENCOUNTER — Other Ambulatory Visit: Payer: Self-pay | Admitting: Internal Medicine

## 2021-02-13 ENCOUNTER — Other Ambulatory Visit: Payer: Self-pay | Admitting: Internal Medicine

## 2021-02-13 DIAGNOSIS — K5904 Chronic idiopathic constipation: Secondary | ICD-10-CM

## 2021-03-03 ENCOUNTER — Other Ambulatory Visit: Payer: Self-pay | Admitting: Internal Medicine

## 2021-04-01 ENCOUNTER — Other Ambulatory Visit: Payer: Self-pay

## 2021-04-01 ENCOUNTER — Encounter: Payer: Self-pay | Admitting: Internal Medicine

## 2021-04-01 ENCOUNTER — Ambulatory Visit (INDEPENDENT_AMBULATORY_CARE_PROVIDER_SITE_OTHER): Payer: 59 | Admitting: Internal Medicine

## 2021-04-01 VITALS — BP 106/74 | HR 81 | Temp 96.2°F | Ht 69.0 in | Wt 188.6 lb

## 2021-04-01 DIAGNOSIS — F411 Generalized anxiety disorder: Secondary | ICD-10-CM

## 2021-04-01 DIAGNOSIS — E040 Nontoxic diffuse goiter: Secondary | ICD-10-CM | POA: Diagnosis not present

## 2021-04-01 DIAGNOSIS — Z0000 Encounter for general adult medical examination without abnormal findings: Secondary | ICD-10-CM

## 2021-04-01 DIAGNOSIS — K219 Gastro-esophageal reflux disease without esophagitis: Secondary | ICD-10-CM | POA: Diagnosis not present

## 2021-04-01 DIAGNOSIS — E785 Hyperlipidemia, unspecified: Secondary | ICD-10-CM

## 2021-04-01 DIAGNOSIS — K5909 Other constipation: Secondary | ICD-10-CM | POA: Diagnosis not present

## 2021-04-01 DIAGNOSIS — I10 Essential (primary) hypertension: Secondary | ICD-10-CM | POA: Diagnosis not present

## 2021-04-01 DIAGNOSIS — J309 Allergic rhinitis, unspecified: Secondary | ICD-10-CM

## 2021-04-01 DIAGNOSIS — R7303 Prediabetes: Secondary | ICD-10-CM

## 2021-04-01 DIAGNOSIS — R0982 Postnasal drip: Secondary | ICD-10-CM

## 2021-04-01 LAB — MICROALBUMIN / CREATININE URINE RATIO
Creatinine,U: 29 mg/dL
Microalb Creat Ratio: 2.4 mg/g (ref 0.0–30.0)
Microalb, Ur: <0.7 mg/dL (ref 0.0–1.9)

## 2021-04-01 LAB — COMPREHENSIVE METABOLIC PANEL
ALT: 16 U/L (ref 0–35)
AST: 16 U/L (ref 0–37)
Albumin: 4.8 g/dL (ref 3.5–5.2)
Alkaline Phosphatase: 79 U/L (ref 39–117)
BUN: 13 mg/dL (ref 6–23)
CO2: 29 mEq/L (ref 19–32)
Calcium: 9.9 mg/dL (ref 8.4–10.5)
Chloride: 102 mEq/L (ref 96–112)
Creatinine, Ser: 0.83 mg/dL (ref 0.40–1.20)
GFR: 78.29 mL/min (ref >60.00)
Glucose, Bld: 101 mg/dL — ABNORMAL HIGH (ref 70–99)
Potassium: 4.1 mEq/L (ref 3.5–5.1)
Sodium: 138 mEq/L (ref 135–145)
Total Bilirubin: 0.5 mg/dL (ref 0.2–1.2)
Total Protein: 7.3 g/dL (ref 6.0–8.3)

## 2021-04-01 LAB — LIPID PANEL
Cholesterol: 219 mg/dL — ABNORMAL HIGH (ref 0–200)
HDL: 55.1 mg/dL (ref >39.00)
LDL Cholesterol: 141 mg/dL — ABNORMAL HIGH (ref 0–99)
NonHDL: 163.98
Total CHOL/HDL Ratio: 4
Triglycerides: 116 mg/dL (ref 0.0–149.0)
VLDL: 23.2 mg/dL (ref 0.0–40.0)

## 2021-04-01 LAB — HEMOGLOBIN A1C: Hgb A1c MFr Bld: 5.9 % (ref 4.6–6.5)

## 2021-04-01 LAB — TSH: TSH: 0.8 u[IU]/mL (ref 0.35–5.50)

## 2021-04-01 NOTE — Patient Instructions (Signed)
For the constipation:  Try drinking 16 ounces of water daily in the morning BEFORE COFFEE  Add magnesium citrate 250 mg capsule at bedtime    PLEASE START EXERCISING REGULARLY .  STOP PUTTING IT OFF

## 2021-04-01 NOTE — Progress Notes (Signed)
Patient ID: Dawn Griffith, female    DOB: 1966/01/05  Age: 55 y.o. MRN: [id number]  The patient is here for follow up  and management of other chronic and acute problems.  This visit occurred during the SARS-CoV-2 public health emergency.  Safety protocols were in place, including screening questions prior to the visit, additional usage of staff PPE, and extensive cleaning of exam room while observing appropriate contact time as indicated for disinfecting solutions.     The risk factors are reflected in the social history.  The roster of all physicians providing medical care to patient - is listed in the Snapshot section of the chart.  Activities of daily living:  The patient is 100% independent in all ADLs: dressing, toileting, feeding as well as independent mobility  Home safety : The patient has smoke detectors in the home. They wear seatbelts.  There are no firearms at home. There is no violence in the home.   There is no risks for hepatitis, STDs or HIV. There is no   history of blood transfusion. They have no travel history to infectious disease endemic areas of the world.  The patient has seen their dentist in the last six month. They have seen their eye doctor in the last year. They admit to slight hearing difficulty with regard to whispered voices and some television programs.  They have deferred audiologic testing in the last year.  They do not  have excessive sun exposure. Discussed the need for sun protection: hats, long sleeves and use of sunscreen if there is significant sun exposure.   Diet: the importance of a healthy diet is discussed. They do have a healthy diet.  The benefits of regular aerobic exercise were discussed. She walks 4 times per week ,  20 minutes.   Depression screen: there are no signs or vegative symptoms of depression- irritability, change in appetite, anhedonia, sadness/tearfullness.  Cognitive assessment: the patient manages all their financial and personal  affairs and is actively engaged. They could relate day,date,year and events; recalled 2/3 objects at 3 minutes; performed clock-face test normally.  The following portions of the patient's history were reviewed and updated as appropriate: allergies, current medications, past family history, past medical history,  past surgical history, past social history  and problem list.  Visual acuity was not assessed per patient preference since she has regular follow up with her ophthalmologist. Hearing and body mass index were assessed and reviewed.   During the course of the visit the patient was educated and counseled about appropriate screening and preventive services including : fall prevention , diabetes screening, nutrition counseling, colorectal cancer screening, and recommended immunizations.    CC: The primary encounter diagnosis was Encounter for preventive health examination. Diagnoses of Gastroesophageal reflux disease without esophagitis, Hyperlipidemia LDL goal <130, Prediabetes, Goiter diffuse, Allergic rhinitis with postnasal drip, Chronic constipation, Generalized anxiety disorder, and Essential hypertension, benign were also pertinent to this visit.  1)  follow up on GAD with insomnia:  chronic,  has been taking ambien , elavil and clonazepam .at bedtime .  Latency is 3 hours.    Sleeps for 5 hours.   Anxiety is aggravated by feeling "Spread too thin," parents won't downsize and she is doing all the maintenance   2) seasonal headaches:  pressure over left frontal sinus  occurring  2 time s per week at most ,  some sneezing .. taking zyrtec once daily   3) colonoscopy scheduled for May 2023 as 5 yr follow  up .   4)  GERD:  persistent symptoms requiring use of PPI No prior EGD but takes omeprazole   5) HTN:  Patient is taking her medications as prescribed and notes no adverse effects.  Home BP readings have been done about once per week and are  generally < 130/80 .  Marland Kitchen   History  Majesty  has a past medical history of Anxiety (12/30/2010), Cancer ([place]), Chronic constipation, Family history of FAP (familial adenomatous polyposis) (07/07/2014), Fibroid uterus (07/07/2014), Goiter diffuse (03/05/2014), Hyperlipidemia LDL goal <130 (11/07/2013), Hypertension, Insomnia secondary to anxiety (07/04/2016), Overweight (BMI 25.0-29.9) (11/04/2012), Prediabetes (01/02/2016), and Vitamin D deficiency (07/04/2016).   She has a past surgical history that includes Cesarean section (1992); Ectopic pregnancy surgery (1997); Lymph node biopsy; Nasal sinus surgery; Colonoscopy; and Cholecystectomy (Dec 2012).   Her family history includes Arrhythmia in her mother; Colon polyps in her mother; Diabetes in her father and mother; Hypertension in her brother, father, and mother; Kidney disease in her mother; Uterine cancer in her mother.She reports that she has never smoked. She has never used smokeless tobacco. She reports that she does not drink alcohol and does not use drugs.  Outpatient Medications Prior to Visit  Medication Sig Dispense Refill   amitriptyline (ELAVIL) 100 MG tablet TAKE ONE TABLET BY MOUTH EVERY NIGHT AT BEDTIME 90 tablet 1   cetirizine (ZYRTEC) 10 MG tablet Take 10 mg by mouth daily.     cholecalciferol (VITAMIN D) 1000 units tablet Take 1,000 Units by mouth daily.     clonazePAM (KLONOPIN) 0.5 MG tablet Take 1 tablet (0.5 mg total) by mouth daily as needed for anxiety. 30 tablet 5   docusate sodium (COLACE) 100 MG capsule Take 100 mg by mouth 2 (two) times daily.     FLUoxetine (PROZAC) 20 MG capsule TAKE ONE CAPSULE BY MOUTH DAILY 90 capsule 1   Lactobacillus (PROBIOTIC ACIDOPHILUS PO) Take 1 tablet by mouth daily.     LINZESS 290 MCG CAPS capsule TAKE ONE CAPSULE BY MOUTH EVERY MORNING BEFORE A MEAL 30 capsule 1   losartan (COZAAR) 100 MG tablet TAKE ONE TABLET BY MOUTH DAILY 90 tablet 0   Multiple Vitamin (MULTI-VITAMIN DAILY PO) Take by mouth.     omeprazole (PRILOSEC) 40 MG capsule TAKE ONE  CAPSULE BY MOUTH DAILY 90 capsule 3   spironolactone (ALDACTONE) 25 MG tablet TAKE ONE TABLET BY MOUTH DAILY 90 tablet 0   ZINC-VITAMIN C PO Take by mouth.     zolpidem (AMBIEN CR) 12.5 MG CR tablet TAKE ONE TABLET BY MOUTH EVERY NIGHT AT BEDTIME AS NEEDED FOR SLEEP 30 tablet 5   FLUoxetine (PROZAC) 10 MG capsule TAKE ONE CAPSULE BY MOUTH DAILY (Patient not taking: Reported on 04/01/2021) 90 capsule 1   Dapsone 7.5 % GEL APPLY A THIN LAYER TOPICALLY TO ENTIRE FACE DAILY (Patient not taking: No sig reported) 60 g 1   No facility-administered medications prior to visit.    Review of Systems  Patient denies headache, fevers, malaise, unintentional weight loss, skin rash, eye pain, sinus congestion and sinus pain, sore throat, dysphagia,  hemoptysis , cough, dyspnea, wheezing, chest pain, palpitations, orthopnea, edema, abdominal pain, nausea, melena, diarrhea, constipation, flank pain, dysuria, hematuria, urinary  Frequency, nocturia, numbness, tingling, seizures,  Focal weakness, Loss of consciousness,  Tremor, insomnia, depression, anxiety, and suicidal ideation.     Objective:  BP 106/74 (BP Location: Left Arm, Patient Position: Sitting, Cuff Size: Normal)   Pulse 81   Temp Marland Kitchen)  96.2 F (35.7 C) (Temporal)   Ht 5\' 9"  (1.753 m)   Wt 188 lb 9.6 oz (85.5 kg)   LMP 05/01/2014 (Approximate)   SpO2 96%   BMI 27.85 kg/m   Physical Exam  General appearance: alert, cooperative and appears stated age Head: Normocephalic, without obvious abnormality, atraumatic Eyes: conjunctivae/corneas clear. PERRL, EOM's intact. Fundi benign. Ears: normal TM's and external ear canals both ears Nose: Nares normal. Septum midline. Mucosa normal. No drainage or sinus tenderness. Throat: lips, mucosa, and tongue normal; teeth and gums normal Neck: no adenopathy, no carotid bruit, no JVD, supple, symmetrical, trachea midline and thyroid not enlarged, symmetric, no tenderness/mass/nodules Lungs: clear to  auscultation bilaterally Breasts: normal appearance, no masses or tenderness Heart: regular rate and rhythm, S1, S2 normal, no murmur, click, rub or gallop Abdomen: soft, non-tender; bowel sounds normal; no masses,  no organomegaly Extremities: extremities normal, atraumatic, no cyanosis or edema Pulses: 2+ and symmetric Skin: Skin color, texture, turgor normal. No rashes or lesions Neurologic: Alert and oriented X 3, normal strength and tone. Normal symmetric reflexes. Normal coordination and gait.     Assessment & Plan:   Problem List Items Addressed This Visit     Allergic rhinitis with postnasal drip    encouraged to add Flonase and/or increase  zyrtec to bid.       Chronic constipation    Reviewed current therapy,  Suggestions made..  Colonoscopy scheduled      Encounter for preventive health examination - Primary    age appropriate education and counseling updated, referrals for preventative services and immunizations addressed, dietary and smoking counseling addressed, most recent labs reviewed.  I have personally reviewed and have noted:   1) the patient's medical and social history 2) The pt's use of alcohol, tobacco, and illicit drugs 3) The patient's current medications and supplements 4) Functional ability including ADL's, fall risk, home safety risk, hearing and visual impairment 5) Diet and physical activities 6) Evidence for depression or mood disorder 7) The patient's height, weight, and BMI have been recorded in the chart   I have made referrals, and provided counseling and education based on review of the above      Essential hypertension, benign    Well controlled on current regimen. Renal function stable, no changes today.  Lab Results  Component Value Date   CREATININE 0.83 04/01/2021   Lab Results  Component Value Date   NA 138 04/01/2021   K 4.1 04/01/2021   CL 102 04/01/2021   CO2 29 04/01/2021         Generalized anxiety disorder     Managed with prozac and clonazepam.  Counselling given about making changes to her role to achieve life balance. The risks and benefits of benzodiazepine use were reviewed with patient today including excessive sedation leading to respiratory depression,  impaired thinking/driving, and addiction.  Patient was advised to avoid concurrent use with alcohol, to use medication only as needed and not to share with others  .       GERD (gastroesophageal reflux disease)    symptoms have been present for nearly 5 years.  PPI dependent.  Referring to GI for EGD       Relevant Orders   Ambulatory referral to Gastroenterology   Goiter diffuse   Relevant Orders   TSH (Completed)   Hyperlipidemia LDL goal <130   Relevant Orders   Lipid panel (Completed)   Prediabetes    She has lowered her A1c with  A low  glycemic index diet and had intentionally lost weight with regular participation in an aerobic activity but has gained 9 lbs  Since last year due to  Increased responsibilities resulting in less regular exercise.  Encouraged to resume a program of exercise    Lab Results  Component Value Date   HGBA1C 5.9 04/01/2021         Relevant Orders   Hemoglobin A1c (Completed)   Comprehensive metabolic panel (Completed)   Microalbumin / creatinine urine ratio (Completed)    I have discontinued Raylei C. Gallen's Dapsone. I am also having her maintain her cetirizine, Lactobacillus (PROBIOTIC ACIDOPHILUS PO), docusate sodium, cholecalciferol, ZINC-VITAMIN C PO, Multiple Vitamin (MULTI-VITAMIN DAILY PO), FLUoxetine, clonazePAM, zolpidem, losartan, spironolactone, amitriptyline, Linzess, omeprazole, and FLUoxetine.  No orders of the defined types were placed in this encounter.   Medications Discontinued During This Encounter  Medication Reason   Dapsone 7.5 % GEL     Follow-up: Return in about 6 months (around 09/30/2021).   Crecencio Mc, MD

## 2021-04-03 ENCOUNTER — Other Ambulatory Visit: Payer: Self-pay | Admitting: Internal Medicine

## 2021-04-03 NOTE — Assessment & Plan Note (Signed)
Well controlled on current regimen. Renal function stable, no changes today.  Lab Results  Component Value Date   CREATININE 0.83 04/01/2021   Lab Results  Component Value Date   NA 138 04/01/2021   K 4.1 04/01/2021   CL 102 04/01/2021   CO2 29 04/01/2021

## 2021-04-03 NOTE — Assessment & Plan Note (Signed)
Managed with prozac and clonazepam.  Counselling given about making changes to her role to achieve life balance. The risks and benefits of benzodiazepine use were reviewed with patient today including excessive sedation leading to respiratory depression,  impaired thinking/driving, and addiction.  Patient was advised to avoid concurrent use with alcohol, to use medication only as needed and not to share with others  .

## 2021-04-03 NOTE — Assessment & Plan Note (Signed)

## 2021-04-03 NOTE — Assessment & Plan Note (Signed)
She has lowered her A1c with  A low  glycemic index diet and had intentionally lost weight with regular participation in an aerobic activity but has gained 9 lbs  Since last year due to  Increased responsibilities resulting in less regular exercise.  Encouraged to resume a program of exercise    Lab Results  Component Value Date   HGBA1C 5.9 04/01/2021

## 2021-04-03 NOTE — Assessment & Plan Note (Signed)
Reviewed current therapy,  Suggestions made..  Colonoscopy scheduled

## 2021-04-03 NOTE — Assessment & Plan Note (Signed)
encouraged to add Flonase and/or increase  zyrtec to bid.

## 2021-04-03 NOTE — Assessment & Plan Note (Signed)
symptoms have been present for nearly 5 years.  PPI dependent.  Referring to GI for EGD

## 2021-04-14 ENCOUNTER — Other Ambulatory Visit: Payer: Self-pay | Admitting: Internal Medicine

## 2021-04-14 DIAGNOSIS — K5904 Chronic idiopathic constipation: Secondary | ICD-10-CM

## 2021-04-26 ENCOUNTER — Other Ambulatory Visit: Payer: Self-pay

## 2021-04-26 ENCOUNTER — Ambulatory Visit
Admission: RE | Admit: 2021-04-26 | Discharge: 2021-04-26 | Disposition: A | Discharge status: Home / Self Care | Payer: 59 | Source: Ambulatory Visit | Attending: Obstetrics and Gynecology | Admitting: Obstetrics and Gynecology

## 2021-04-26 DIAGNOSIS — Z1231 Encounter for screening mammogram for malignant neoplasm of breast: Secondary | ICD-10-CM | POA: Diagnosis present

## 2021-05-08 ENCOUNTER — Other Ambulatory Visit: Payer: Self-pay

## 2021-05-08 ENCOUNTER — Ambulatory Visit
Admission: EM | Admit: 2021-05-08 | Discharge: 2021-05-08 | Disposition: A | Discharge status: Home / Self Care | Payer: 59 | Attending: Emergency Medicine | Admitting: Emergency Medicine

## 2021-05-08 ENCOUNTER — Encounter: Payer: Self-pay | Admitting: Emergency Medicine

## 2021-05-08 DIAGNOSIS — N764 Abscess of vulva: Secondary | ICD-10-CM

## 2021-05-08 MED ORDER — SULFAMETHOXAZOLE-TRIMETHOPRIM 800-160 MG PO TABS: 1.0000 | ORAL_TABLET | Freq: Two times a day (BID) | ORAL | 0 refills | Status: AC

## 2021-05-08 NOTE — ED Provider Notes (Signed)
Dawn Griffith    CSN: [id number] Arrival date & time: 05/08/21  1326      History   Chief Complaint Chief Complaint  Patient presents with   Abscess    HPI Dawn Griffith is a 55 y.o. female.  Patient presents with abscess on her labia x 3-4 days.  It started draining yesterday.  Treatment at home with warm compresses.  She denies fever, chills, vaginal discharge, pelvic pain, abdominal pain, dysuria, or other symptoms.  Her medical history includes hypertension.  The history is provided by the patient and medical records.   Past Medical History:  Diagnosis Date   Anxiety 12/30/2010   Cancer [facility])    as a child   Chronic constipation    Family history of FAP (familial adenomatous polyposis) 07/07/2014   5 yr interval screening Last colonoscopy 2018   Fibroid uterus 07/07/2014   Noted during pelvic ultrasound in 2014 for fullness on exam    Goiter diffuse 03/05/2014   Repeat ultrasound of thyroid is unchanged.     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   Prediabetes 01/02/2016   Left hip pain 02/23/2015   Encounter for preventive health examination 08/05/2014   Herpes zoster 07/20/2014   Fibroid uterus 07/07/2014   Family history of FAP (familial adenomatous polyposis) 07/07/2014   Goiter diffuse 03/05/2014   Hyperlipidemia LDL goal <130 11/07/2013   Essential hypertension, benign 11/07/2013   Overweight (BMI 25.0-29.9) 11/04/2012   Well woman exam (no gynecological exam) 11/03/2011   Chronic constipation 05/07/2011   GERD  (gastroesophageal reflux disease) 12/30/2010   Generalized anxiety disorder 12/30/2010    Past Surgical History:  Procedure Laterality Date   CESAREAN SECTION  1992   CHOLECYSTECTOMY  Dec 2012   [facility]   COLONOSCOPY     ECTOPIC PREGNANCY SURGERY  1997   LYMPH NODE BIOPSY     NASAL SINUS SURGERY      OB History     Gravida  3   Para  1   Term      Preterm      AB  2   Living         SAB  1   IAB      Ectopic  1   Multiple      Live Births               Home Medications    Prior to Admission medications   Medication Sig Start Date End Date Taking? Authorizing Provider  sulfamethoxazole-trimethoprim (BACTRIM DS) 800-160 MG tablet Take 1 tablet by mouth 2 (two) times daily for 7 days. 05/08/21 05/15/21 Yes Sharion Balloon, NP  amitriptyline (ELAVIL) 100 MG tablet TAKE ONE TABLET BY MOUTH EVERY NIGHT AT BEDTIME 01/17/21   Crecencio Mc, MD  cetirizine (ZYRTEC) 10 MG tablet Take 10 mg by mouth daily.    [provider]  cholecalciferol (VITAMIN D) 1000 units tablet Take 1,000 Units by mouth daily.    [provider]  clonazePAM (KLONOPIN) 0.5 MG tablet Take 1 tablet (0.5 mg total) by mouth daily as needed for anxiety. 12/01/20   Crecencio Mc, MD  docusate sodium (COLACE) 100 MG capsule Take 100 mg by mouth 2 (two) times daily.    [provider]  FLUoxetine (PROZAC) 10 MG capsule TAKE ONE CAPSULE BY MOUTH DAILY Patient not taking: Reported on 04/01/2021 10/22/20   Crecencio Mc, MD  FLUoxetine (PROZAC) 20 MG capsule TAKE ONE CAPSULE BY MOUTH DAILY 03/03/21   Crecencio Mc, MD  Lactobacillus (PROBIOTIC ACIDOPHILUS PO) Take 1 tablet by mouth daily.    [provider]  LINZESS 290 MCG CAPS capsule TAKE ONE CAPSULE BY MOUTH EVERY MORNING BEFORE A MEAL 04/14/21   Crecencio Mc, MD  losartan (COZAAR) 100 MG tablet TAKE ONE TABLET BY MOUTH DAILY 04/04/21   Crecencio Mc, MD  Multiple Vitamin (MULTI-VITAMIN DAILY PO) Take by  mouth.    [provider]  omeprazole (PRILOSEC) 40 MG capsule TAKE ONE CAPSULE BY MOUTH DAILY 03/03/21   Crecencio Mc, MD  spironolactone (ALDACTONE) 25 MG tablet TAKE ONE TABLET BY MOUTH DAILY 04/04/21   Crecencio Mc, MD  ZINC-VITAMIN C PO Take by mouth.    [provider]  zolpidem (AMBIEN CR) 12.5 MG CR tablet TAKE ONE TABLET BY MOUTH EVERY NIGHT AT BEDTIME AS NEEDED FOR SLEEP 12/01/20   Crecencio Mc, MD    Family History Family History  Problem Relation Age of Onset   Arrhythmia Mother    Hypertension Mother    Colon polyps Mother        s/p colectomy    Kidney disease Mother        CKD Stage 4   Uterine cancer Mother    Diabetes Mother    Hypertension Father    Diabetes Father    Hypertension Brother    Breast cancer Neg Hx     Social History Social History   Tobacco Use   Smoking status: Never   Smokeless tobacco: Never  Vaping Use   Vaping Use: Never used  Substance Use Topics   Alcohol use: No    Alcohol/week: 0.0 standard drinks   Drug use: No     Allergies   Patient has no known allergies.   Review of Systems Review of Systems  Constitutional:  Negative for chills and fever.  Respiratory:  Negative for cough and shortness of breath.   Cardiovascular:  Negative for chest pain and palpitations.  Gastrointestinal:  Negative for abdominal pain and vomiting.  Genitourinary:  Negative for dysuria, pelvic pain and vaginal discharge.  Skin:  Positive for wound. Negative for color change.  All other systems reviewed and are negative.   Physical Exam Triage Vital Signs ED Triage Vitals  Enc Vitals Group     BP 05/08/21 1457 (!) 100/57     Pulse Rate 05/08/21 1457 (!) 106     Resp 05/08/21 1457 18     Temp 05/08/21 1457 99.4 F (37.4 C)     Temp Source 05/08/21 1457 Oral     SpO2 05/08/21 1457 98 %     Weight --      Height --      Head Circumference --      Peak Flow --      Pain Score 05/08/21 1504 6     Pain Loc --       Pain Edu? --      Excl. in  GC? --    No data found.  Updated Vital Signs BP (!) 100/57   Pulse (!) 106   Temp 99.4 F (37.4 C) (Oral)   Resp 18   LMP 05/01/2014 (Approximate)   SpO2 98%   Visual Acuity Right Eye Distance:   Left Eye Distance:   Bilateral Distance:    Right Eye Near:   Left Eye Near:    Bilateral Near:     Physical Exam Vitals and nursing note reviewed.  Constitutional:      General: She is not in acute distress.    Appearance: She is well-developed.  HENT:     Head: Normocephalic and atraumatic.  Eyes:     Conjunctiva/sclera: Conjunctivae normal.  Cardiovascular:     Rate and Rhythm: Normal rate and regular rhythm.     Heart sounds: No murmur heard. Pulmonary:     Effort: Pulmonary effort is normal. No respiratory distress.     Breath sounds: Normal breath sounds.  Abdominal:     Palpations: Abdomen is soft.     Tenderness: There is no abdominal tenderness.  Genitourinary:    Vagina: No vaginal discharge.  Musculoskeletal:        General: No swelling.     Cervical back: Neck supple.  Skin:    General: Skin is warm and dry.     Capillary Refill: Capillary refill takes less than 2 seconds.     Findings: Lesion present.     Comments: Left labia: 2 cm area of tender induration with central open lesion and scant purulent drainage.   Neurological:     Mental Status: She is alert.  Psychiatric:        Mood and Affect: Mood normal.     UC Treatments / Results  Labs (all labs ordered are listed, but only abnormal results are displayed) Labs Reviewed - No data to display  EKG   Radiology No results found.  Procedures Procedures (including critical care time)  Medications Ordered in UC Medications - No data to display  Initial Impression / Assessment and Plan / UC Course  I have reviewed the triage vital signs and the nursing notes.  Pertinent labs & imaging results that were available during my care of the patient were reviewed by  me and considered in my medical decision making (see chart for details).   Abscess of labia.  No I&D indicated because it is open and draining already.  Treating with Bactrim DS.  Instructed her to continue warm compresses.  Education provided on abscess.  Instructed her to follow-up with PCP or gynecologist if symptoms are not improving.  Patient agrees to plan of care.   Final Clinical Impressions(s) / UC Diagnoses   Final diagnoses:  Abscess of labia     Discharge Instructions      Take the antibiotic as directed.  Follow-up with your primary care provider or gynecologist if your symptoms are not improving.     ED Prescriptions     Medication Sig Dispense Auth. Provider   sulfamethoxazole-trimethoprim (BACTRIM DS) 800-160 MG tablet Take 1 tablet by mouth 2 (two) times daily for 7 days. 14 tablet Sharion Balloon, NP      PDMP not reviewed this encounter.   Sharion Balloon, NP 05/08/21 1525

## 2021-05-08 NOTE — Discharge Instructions (Addendum)
Take the antibiotic as directed.  Follow-up with your primary care provider or gynecologist if your symptoms are not improving.

## 2021-05-08 NOTE — ED Triage Notes (Signed)
Pt here with ingrown hair and abscess on left side of labia. Draining after applying warm compresses.

## 2021-06-08 ENCOUNTER — Other Ambulatory Visit: Payer: Self-pay | Admitting: Internal Medicine

## 2021-06-08 NOTE — Telephone Encounter (Signed)
Last fill 05/04/21 on both clonopin and Ambien last OV 04/01/21 okay to fill?

## 2021-06-16 ENCOUNTER — Other Ambulatory Visit: Payer: Self-pay | Admitting: Internal Medicine

## 2021-06-16 DIAGNOSIS — K5904 Chronic idiopathic constipation: Secondary | ICD-10-CM

## 2021-06-29 ENCOUNTER — Telehealth: Payer: Self-pay | Admitting: Internal Medicine

## 2021-06-29 NOTE — Telephone Encounter (Signed)
Patient called to let office know that her insurance is now [employer]. It is updated in patient's chart. She said that her new insurance stated that a prior authorization was needed on her LINZESS 290 MCG CAPS capsule.  Office can call [employer] at [phone] for prior authorization.

## 2021-06-30 ENCOUNTER — Other Ambulatory Visit: Payer: Self-pay | Admitting: Internal Medicine

## 2021-06-30 NOTE — Telephone Encounter (Signed)
PA for Linzess has been submitted on covermymeds.

## 2021-07-05 MED ORDER — LUBIPROSTONE 24 MCG PO CAPS: 24.0000 ug | ORAL_CAPSULE | Freq: Two times a day (BID) | ORAL | 2 refills | Status: DC

## 2021-07-05 NOTE — Addendum Note (Signed)
Addended by: Crecencio Mc on: 07/05/2021 05:27 PM   Modules accepted: Orders

## 2021-07-05 NOTE — Telephone Encounter (Signed)
Generic amitiza ,  dose is 24 mcg two times daily ,  has been sent to [facility] per insurance mandate to try first before they will consider covering Linzess

## 2021-07-05 NOTE — Telephone Encounter (Signed)
Linzess has been denied by [employer]. It is requiring that the pt try lubiprostone first.

## 2021-07-07 NOTE — Telephone Encounter (Signed)
PA for Lubiprostone has been submitted on covermymeds and approved. Pt has been made aware through insurance.

## 2021-07-16 ENCOUNTER — Other Ambulatory Visit: Payer: Self-pay | Admitting: Internal Medicine

## 2021-09-06 ENCOUNTER — Telehealth (INDEPENDENT_AMBULATORY_CARE_PROVIDER_SITE_OTHER): Payer: 59 | Admitting: Internal Medicine

## 2021-09-06 ENCOUNTER — Encounter: Payer: Self-pay | Admitting: Internal Medicine

## 2021-09-06 DIAGNOSIS — J301 Allergic rhinitis due to pollen: Secondary | ICD-10-CM | POA: Diagnosis not present

## 2021-09-06 DIAGNOSIS — J0100 Acute maxillary sinusitis, unspecified: Secondary | ICD-10-CM | POA: Diagnosis not present

## 2021-09-06 DIAGNOSIS — J309 Allergic rhinitis, unspecified: Secondary | ICD-10-CM | POA: Insufficient documentation

## 2021-09-06 MED ORDER — AMOXICILLIN-POT CLAVULANATE 875-125 MG PO TABS: 1.0000 | ORAL_TABLET | Freq: Two times a day (BID) | ORAL | 0 refills | Status: DC

## 2021-09-06 MED ORDER — MOMETASONE FUROATE 50 MCG/ACT NA SUSP: 2.0000 | Freq: Every day | NASAL | 12 refills | Status: DC

## 2021-09-06 MED ORDER — HYDROCOD POLI-CHLORPHE POLI ER 10-8 MG/5ML PO SUER: 5.0000 mL | Freq: Two times a day (BID) | ORAL | 0 refills | Status: DC | PRN

## 2021-09-06 MED ORDER — PREDNISONE 10 MG PO TABS: ORAL_TABLET | ORAL | 0 refills | Status: DC

## 2021-09-06 NOTE — Assessment & Plan Note (Signed)
Continue zyrtec bid  Add back nasonex  ?

## 2021-09-06 NOTE — Progress Notes (Signed)
Virtual Visit via Caregility Note ? ?This visit type was conducted due to national recommendations for restrictions regarding the COVID-19 pandemic (e.g. social distancing).  This format is felt to be most appropriate for this patient at this time.  All issues noted in this document were discussed and addressed.  No physical exam was performed (except for noted visual exam findings with Video Visits).  ? ?I connected withNAME@ on 09/06/21 at 10:45 AM EDT by a video enabled telemedicine application and verified that I am speaking with the correct person using two identifiers. ?Location patient: home ?Location provider: work or home office ?Persons participating in the virtual visit: patient, provider ? ?I discussed the limitations, risks, security and privacy concerns of performing an evaluation and management service by telephone and the availability of in person appointments. I also discussed with the patient that there may be a patient responsible charge related to this service. The patient expressed understanding and agreed to proceed. ? ? ?Reason for visit: sinusitis  ? ?HPI: ? ?56 yr old female with history of allergic rhinitis and previous sinus surgery for chronic sinusitis (last seen at [facility] Feb 2020)  presents with 4 day history of sinus pain/pressure/congestion .  Drainage is purulent and upper teeth are hurting. Taking zyrtec twice daily  no longer using nasonex.  Using saline irrigation but unable to flush due to severe congestion . Not using any decongestants.  COVID NEGATIVE HOME TEST YESTERDAY.  UNABLE TO SLEEP due to persistent cough despite use of benzonatate capsules  ? ? ?ROS: See pertinent positives and negatives per HPI. ? ?Past Medical History:  ?Diagnosis Date  ? Anxiety 12/30/2010  ? Cancer [facility])   ? as a child  ? Chronic constipation   ? Family history of FAP (familial adenomatous polyposis) 07/07/2014  ? 5 yr interval screening Last colonoscopy 2018  ? Fibroid uterus 07/07/2014  ? Noted during  pelvic ultrasound in 2014 for fullness on exam   ? Goiter diffuse 03/05/2014  ? Repeat ultrasound of thyroid is unchanged.    ? Hyperlipidemia LDL goal <130 11/07/2013  ? Hypertension   ? 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 Overweight (BMI 25.0-29.9) 11/04/2012  ? Goal weight for BMI < 25 based on height of 5'9.5" is < 170 lbs Body mass index is 27.52 kg/m?.   ? Prediabetes 01/02/2016  ? Vitamin D deficiency 07/04/2016  ? ? ?Past Surgical History:  ?Procedure Laterality Date  ? [place]  ? CHOLECYSTECTOMY  Dec 2012  ? [facility]  ? COLONOSCOPY    ? ECTOPIC PREGNANCY SURGERY  1997  ? LYMPH NODE BIOPSY    ? NASAL SINUS SURGERY    ? ? ?Family History  ?Problem Relation Age of Onset  ? Arrhythmia Mother   ? Hypertension Mother   ? Colon polyps Mother   ?     s/p colectomy   ? Kidney disease Mother   ?     CKD Stage 4  ? Uterine cancer Mother   ? Diabetes Mother   ? Hypertension Father   ? Diabetes Father   ? Hypertension Brother   ? Breast cancer Neg Hx   ? ? ?SOCIAL HX:  reports that she has never smoked. She has never used smokeless tobacco. She reports that she does not drink alcohol and does not use drugs.  ? ?Current Outpatient Medications:  ?  amitriptyline (ELAVIL) 100 MG tablet, TAKE ONE TABLET BY MOUTH EVERY NIGHT AT BEDTIME, Disp: 90 tablet, Rfl:  1 ?  amoxicillin-clavulanate (AUGMENTIN) 875-125 MG tablet, Take 1 tablet by mouth 2 (two) times daily., Disp: 14 tablet, Rfl: 0 ?  Ascorbic Acid (VITAMIN C) 1000 MG tablet, Take 1,000 mg by mouth daily., Disp: , Rfl:  ?  cetirizine (ZYRTEC) 10 MG tablet, Take 10 mg by mouth daily., Disp: , Rfl:  ?  chlorpheniramine-HYDROcodone (TUSSIONEX PENNKINETIC ER) 10-8 MG/5ML, Take 5 mLs by mouth every 12 (twelve) hours as needed., Disp: 180 mL, Rfl: 0 ?  cholecalciferol (VITAMIN D) 1000 units tablet, Take 1,000 Units by mouth daily., Disp: , Rfl:  ?  clonazePAM (KLONOPIN) 0.5 MG tablet, TAKE ONE TABLET BY MOUTH DAILY AS NEEDED FOR ANXIETY, Disp: 30 tablet, Rfl: 3 ?   docusate sodium (COLACE) 100 MG capsule, Take 100 mg by mouth 2 (two) times daily., Disp: , Rfl:  ?  FLUoxetine (PROZAC) 20 MG capsule, TAKE ONE CAPSULE BY MOUTH DAILY, Disp: 90 capsule, Rfl: 1 ?  Lactobacillus (PROBIOTIC ACIDOPHILUS PO), Take 1 tablet by mouth daily., Disp: , Rfl:  ?  losartan (COZAAR) 100 MG tablet, TAKE ONE TABLET BY MOUTH DAILY, Disp: 90 tablet, Rfl: 0 ?  lubiprostone (AMITIZA) 24 MCG capsule, Take 1 capsule (24 mcg total) by mouth 2 (two) times daily with a meal., Disp: 60 capsule, Rfl: 2 ?  mometasone (NASONEX) 50 MCG/ACT nasal spray, Place 2 sprays into the nose daily., Disp: 1 each, Rfl: 12 ?  Multiple Vitamin (MULTI-VITAMIN DAILY PO), Take by mouth., Disp: , Rfl:  ?  omeprazole (PRILOSEC) 40 MG capsule, TAKE ONE CAPSULE BY MOUTH DAILY, Disp: 90 capsule, Rfl: 3 ?  predniSONE (DELTASONE) 10 MG tablet, 6 tablets on Day 1 , then reduce by 1 tablet daily until gone, Disp: 21 tablet, Rfl: 0 ?  spironolactone (ALDACTONE) 25 MG tablet, TAKE ONE TABLET BY MOUTH DAILY, Disp: 90 tablet, Rfl: 0 ?  vitamin B-12 (CYANOCOBALAMIN) 1000 MCG tablet, Take 1,000 mcg by mouth daily., Disp: , Rfl:  ?  zolpidem (AMBIEN CR) 12.5 MG CR tablet, TAKE ONE TABLET BY MOUTH EVERY NIGHT AT BEDTIME AS NEEDED FOR SLEEP, Disp: 30 tablet, Rfl: 3 ? ?EXAM: ? ?VITALS per patient if applicable: ? ?GENERAL: alert, oriented, appears well and in no acute distress ? ?HEENT: atraumatic, conjunttiva clear, no obvious abnormalities on inspection of external nose and ears ? ?NECK: normal movements of the head and neck ? ?LUNGS: on inspection no signs of respiratory distress, breathing rate appears normal, no obvious gross SOB, gasping or wheezing ? ?CV: no obvious cyanosis ? ?MS: moves all visible extremities without noticeable abnormality ? ?PSYCH/NEURO: pleasant and cooperative, no obvious depression or anxiety, speech and thought processing grossly intact ? ?ASSESSMENT AND PLAN: ? ?Discussed the following assessment and plan: ? ?Acute  non-recurrent maxillary sinusitis ? ?Seasonal allergic rhinitis due to pollen ? ?Acute sinusitis ?She has had Sinus drainage and congestion with cough productive of purulent sputum and headaches for the last 4 days .  Patient  has used multiple OTD decongestants,  Sinus rinses,  advil and tylenol along with otc cough suppressants without improvement. Starting empiric therapy with prednisone and Augmentin.  Daily use of a probiotic advised for 3 weeks.  ? ?Allergic rhinitis ?Continue zyrtec bid  Add back nasonex  ?  ?I discussed the assessment and treatment plan with the patient. The patient was provided an opportunity to ask questions and all were answered. The patient agreed with the plan and demonstrated an understanding of the instructions. ?  ?The patient was advised  to call back or seek an in-person evaluation if the symptoms worsen or if the condition fails to improve as anticipated. ? ? ?I spent 20 minutes dedicated to the care of this patient on the date of this encounter to include pre-visit review of her medical history,  review of ENT visists with Dr Bradd Burner at [facility] via Epic portal , Face-to-face time with the patient , and post visit ordering of testing and therapeutics.  ? ? ?Crecencio Mc, MD   ?

## 2021-09-06 NOTE — Assessment & Plan Note (Signed)
She has had Sinus drainage and congestion with cough productive of purulent sputum and headaches for the last 4 days .  Patient  has used multiple OTD decongestants,  Sinus rinses,  advil and tylenol along with otc cough suppressants without improvement. Starting empiric therapy with prednisone and Augmentin.  Daily use of a probiotic advised for 3 weeks.  ?

## 2021-09-06 NOTE — Patient Instructions (Addendum)
am treating you for bacterial sinusitis which is probably a  complication from your allergies causing   persistent sinus congestion. ? ? I am prescribing an antibiotic (augmentin ) and a prednisone taper   To manage the infection and the inflammation in your ear/sinuses.  ? ?Resume daily use of nasonex  spray once the congestion has improved. ? ?Tussionex for the severe cough THIS HAS HYDROCODONE IN IT SO BE CAREFUL,  DON'T DRIVE IF IT MAKES YOU DROWSY AND DON'T LET YOURSELF GET CONSTIPATED ? ?flush your sinuses twice daily with NeilMed sinus rinse  (do over the sink because if you do it right you will spit out globs of mucus) ? ?GET YOURSELF SOME AFRIN NASAL SPRAY AND USE IT TWICE DAILY FOR 3 TO 5 DAYS  ? ?Please take a probiotic ( Align, Flora que or Culturelle) OR A GENERIC EQUIVALENT for three weeks since you are taking an  antibiotic to prevent a very serious antibiotic associated infection  Called clostridium dificile colitis that can cause diarrhea, multi organ failure, sepsis and death if not managed.   ?

## 2021-09-10 ENCOUNTER — Other Ambulatory Visit: Payer: Self-pay | Admitting: Internal Medicine

## 2021-09-30 ENCOUNTER — Ambulatory Visit (INDEPENDENT_AMBULATORY_CARE_PROVIDER_SITE_OTHER): Payer: 59 | Admitting: Internal Medicine

## 2021-09-30 ENCOUNTER — Encounter: Payer: Self-pay | Admitting: Internal Medicine

## 2021-09-30 VITALS — BP 98/72 | HR 94 | Temp 98.1°F | Ht 69.0 in | Wt 188.8 lb

## 2021-09-30 DIAGNOSIS — Z8371 Family history of colonic polyps: Secondary | ICD-10-CM

## 2021-09-30 DIAGNOSIS — F411 Generalized anxiety disorder: Secondary | ICD-10-CM

## 2021-09-30 DIAGNOSIS — E785 Hyperlipidemia, unspecified: Secondary | ICD-10-CM | POA: Diagnosis not present

## 2021-09-30 DIAGNOSIS — R7303 Prediabetes: Secondary | ICD-10-CM | POA: Diagnosis not present

## 2021-09-30 DIAGNOSIS — Z0000 Encounter for general adult medical examination without abnormal findings: Secondary | ICD-10-CM

## 2021-09-30 DIAGNOSIS — I10 Essential (primary) hypertension: Secondary | ICD-10-CM

## 2021-09-30 DIAGNOSIS — E040 Nontoxic diffuse goiter: Secondary | ICD-10-CM | POA: Diagnosis not present

## 2021-09-30 DIAGNOSIS — J301 Allergic rhinitis due to pollen: Secondary | ICD-10-CM

## 2021-09-30 LAB — TSH: TSH: 1 u[IU]/mL (ref 0.35–5.50)

## 2021-09-30 LAB — LIPID PANEL
Cholesterol: 239 mg/dL — ABNORMAL HIGH (ref 0–200)
HDL: 59.6 mg/dL (ref >39.00)
LDL Cholesterol: 156 mg/dL — ABNORMAL HIGH (ref 0–99)
NonHDL: 179.17
Total CHOL/HDL Ratio: 4
Triglycerides: 115 mg/dL (ref 0.0–149.0)
VLDL: 23 mg/dL (ref 0.0–40.0)

## 2021-09-30 LAB — COMPREHENSIVE METABOLIC PANEL
ALT: 18 U/L (ref 0–35)
AST: 18 U/L (ref 0–37)
Albumin: 4.7 g/dL (ref 3.5–5.2)
Alkaline Phosphatase: 76 U/L (ref 39–117)
BUN: 12 mg/dL (ref 6–23)
CO2: 28 mEq/L (ref 19–32)
Calcium: 9.7 mg/dL (ref 8.4–10.5)
Chloride: 102 mEq/L (ref 96–112)
Creatinine, Ser: 0.79 mg/dL (ref 0.40–1.20)
GFR: 82.78 mL/min (ref >60.00)
Glucose, Bld: 104 mg/dL — ABNORMAL HIGH (ref 70–99)
Potassium: 4 mEq/L (ref 3.5–5.1)
Sodium: 137 mEq/L (ref 135–145)
Total Bilirubin: 0.5 mg/dL (ref 0.2–1.2)
Total Protein: 7.1 g/dL (ref 6.0–8.3)

## 2021-09-30 LAB — HEMOGLOBIN A1C: Hgb A1c MFr Bld: 5.9 % (ref 4.6–6.5)

## 2021-09-30 MED ORDER — LUBIPROSTONE 24 MCG PO CAPS: 24.0000 ug | ORAL_CAPSULE | Freq: Two times a day (BID) | ORAL | 5 refills | Status: DC

## 2021-09-30 MED ORDER — FLUOXETINE HCL 40 MG PO CAPS: 40.0000 mg | ORAL_CAPSULE | Freq: Every day | ORAL | 2 refills | Status: DC

## 2021-09-30 NOTE — Assessment & Plan Note (Signed)
Scheduled 5 yr follow up with [facility] at [facility] ?

## 2021-09-30 NOTE — Assessment & Plan Note (Addendum)
Managed with prozac and clonazepam. Aggravated by daughter's pregnancy troubles (Meghan)  And mother's dementia. Counselling given about making changes to her role to achieve life balance and encouraged to exercise regularly. . Increase prozac dose to 40 mg daily The risks and benefits of benzodiazepine use were reviewed with patient today including excessive sedation leading to respiratory depression,  impaired thinking/driving, and addiction.  Patient was advised to avoid concurrent use with alcohol, to use medication only as needed and not to share with others  .  ?

## 2021-09-30 NOTE — Progress Notes (Addendum)
The patient is here for follow up and management of  chronic and acute problems. ?  ?The risk factors are reflected in the social history. ?  ?The roster of all physicians providing medical care to patient - is listed in the Snapshot section of the chart. ?  ?Activities of daily living:  The patient is 100% independent in all ADLs: dressing, toileting, feeding as well as independent mobility ?  ?  ?Weight gain:   the importance of a healthy diet is discussed. They do have a healthy diet. Using low carb shakes. Eating smaller portions.  Trying to exercise but has not committed.  The benefits of regular aerobic exercise were discussed.  ?  ?Depression /anxiety there are no signs or vegative symptoms of depression- irritability, change in appetite, anhedonia, sadness/tearfullness. But her anxiety  is constant  and worse due to meghan's threatened abortion and Krik's unresolved grief and anger . Marland Kitchen  She is functioning at work.   Not sleeping well due to legs feeling more restless and worrying at night  ? ?Constipation:  taking 2 colace, 2 amitiza and 2 probiotics and 250 mg  of magnesium daily   estimated water intake > 60 ounces  has colonoscopy May 24 with Tindler ([place])   ?  ?The following portions of the patient's history were reviewed and updated as appropriate: allergies, current medications, past family history, past medical history,  past surgical history, past social history  and problem list. ?  ?Review of Symptoms ? ?Patient denies headache, fevers, malaise, unintentional weight loss, skin rash, eye pain, sinus congestion and sinus pain, sore throat, dysphagia,  hemoptysis , cough, dyspnea, wheezing, chest pain, palpitations, orthopnea, edema, abdominal pain, nausea, melena, diarrhea, constipation, flank pain, dysuria, hematuria, urinary  Frequency, nocturia, numbness, tingling, seizures,  Focal weakness, Loss of consciousness,  Tremor, insomnia, depression, anxiety, and suicidal ideation.   ? ?Physical  Exam: ? ?BP 98/72 (BP Location: Left Arm, Patient Position: Sitting, Cuff Size: Large)   Pulse 94   Temp 98.1 ?F (36.7 ?C) (Oral)   Ht '5\' 9"'$  (1.753 m)   Wt 188 lb 12.8 oz (85.6 kg)   LMP 05/01/2014 (Approximate)   SpO2 97%   BMI 27.88 kg/m?   ? ?General appearance: alert, cooperative and appears stated age ?Ears: normal TM's and external ear canals both ears ?Throat: lips, mucosa, and tongue normal; teeth and gums normal ?Neck: no adenopathy, no carotid bruit, supple, symmetrical, trachea midline and thyroid not enlarged, symmetric, no tenderness/mass/nodules ?Back: symmetric, no curvature. ROM normal. No CVA tenderness. ?Lungs: clear to auscultation bilaterally ?Heart: regular rate and rhythm, S1, S2 normal, no murmur, click, rub or gallop ?Abdomen: soft, non-tender; bowel sounds normal; no masses,  no organomegaly ?Pulses: 2+ and symmetric ?Skin: Skin color, texture, turgor normal. No rashes or lesions ?Lymph nodes: Cervical, supraclavicular, and axillary nodes normal.  ? ?Assessment and Plan: ? ?Well woman exam (no gynecological exam) ?age appropriate education and counseling updated, referrals for preventative services and immunizations addressed, dietary and smoking counseling addressed, most recent labs reviewed.  I have personally reviewed and have noted: ?  ?1) the patient's medical and social history ?2) The pt's use of alcohol, tobacco, and illicit drugs ?3) The patient's current medications and supplements ?4) Functional ability including ADL's, fall risk, home safety risk, hearing and visual impairment ?5) Diet and physical activities ?6) Evidence for depression or mood disorder ?7) The patient's height, weight, and BMI have been recorded in the chart ?  ?I have  made referrals, and provided counseling and education based on review of the above ? ?Prediabetes ?She has lowered her A1c with  A low  glycemic index diet and had intentionally lost weight with regular participation in an aerobic activity  but has gained weight since last year due to  Increased responsibilities resulting in less regular exercise.  Encouraged to resume a program of exercise   ? ?Lab Results  ?Component Value Date  ? HGBA1C 5.9 04/01/2021  ? ? ? ?Essential hypertension, benign ?Home readings are 130/80 ,  Not as low as here today.  Continue current losartan dose  ? ?Generalized anxiety disorder ?Managed with prozac and clonazepam. Aggravated by daughter's pregnancy troubles (Meghan)  And mother's dementia. Counselling given about making changes to her role to achieve life balance and encouraged to exercise regularly. . Increase prozac dose to 40 mg daily The risks and benefits of benzodiazepine use were reviewed with patient today including excessive sedation leading to respiratory depression,  impaired thinking/driving, and addiction.  Patient was advised to avoid concurrent use with alcohol, to use medication only as needed and not to share with others  .  ? ?Allergic rhinitis ?Using zyrtec bid,  Flonase,   And saline irrigation s ? ?Family history of FAP (familial adenomatous polyposis) ?Scheduled 5 yr follow up with [facility] at [facility] ? ?Hyperlipidemia LDL goal <130 ?10 yr risk using the AHA CRC is 2%  ? ?Lab Results  ?Component Value Date  ? CHOL 239 (H) 09/30/2021  ? HDL 59.60 09/30/2021  ? LDLCALC 156 (H) 09/30/2021  ? LDLDIRECT 155.0 01/22/2017  ? TRIG 115.0 09/30/2021  ? CHOLHDL 4 09/30/2021  ? ? ? ? ? ?I provided  30 minutes of  face-to-face time during this encounter reviewing patient's current problems and past colonoscopies  last October's , labs and recent imaging studies, providing counseling on her anxiety and weight management   , and coordination  of care .  ?Updated Medication List ?Outpatient Encounter Medications as of 09/30/2021  ?Medication Sig  ? amitriptyline (ELAVIL) 100 MG tablet TAKE ONE TABLET BY MOUTH EVERY NIGHT AT BEDTIME  ? Ascorbic Acid (VITAMIN C) 1000 MG tablet Take 1,000 mg by mouth daily.  ? cetirizine  (ZYRTEC) 10 MG tablet Take 10 mg by mouth daily.  ? cholecalciferol (VITAMIN D) 1000 units tablet Take 1,000 Units by mouth daily.  ? clonazePAM (KLONOPIN) 0.5 MG tablet TAKE ONE TABLET BY MOUTH DAILY AS NEEDED FOR ANXIETY  ? docusate sodium (COLACE) 100 MG capsule Take 100 mg by mouth 2 (two) times daily.  ? Lactobacillus (PROBIOTIC ACIDOPHILUS PO) Take 1 tablet by mouth daily.  ? losartan (COZAAR) 100 MG tablet TAKE ONE TABLET BY MOUTH DAILY  ? mometasone (NASONEX) 50 MCG/ACT nasal spray Place 2 sprays into the nose daily.  ? Multiple Vitamin (MULTI-VITAMIN DAILY PO) Take by mouth.  ? omeprazole (PRILOSEC) 40 MG capsule TAKE ONE CAPSULE BY MOUTH DAILY  ? spironolactone (ALDACTONE) 25 MG tablet TAKE ONE TABLET BY MOUTH DAILY  ? vitamin B-12 (CYANOCOBALAMIN) 1000 MCG tablet Take 1,000 mcg by mouth daily.  ? zolpidem (AMBIEN CR) 12.5 MG CR tablet TAKE ONE TABLET BY MOUTH EVERY NIGHT AT BEDTIME AS NEEDED FOR SLEEP  ? [DISCONTINUED] FLUoxetine (PROZAC) 20 MG capsule TAKE ONE CAPSULE BY MOUTH DAILY  ? [DISCONTINUED] lubiprostone (AMITIZA) 24 MCG capsule Take 1 capsule (24 mcg total) by mouth 2 (two) times daily with a meal.  ? FLUoxetine (PROZAC) 40 MG capsule Take 1 capsule (40  mg total) by mouth daily.  ? lubiprostone (AMITIZA) 24 MCG capsule Take 1 capsule (24 mcg total) by mouth 2 (two) times daily with a meal.  ? [DISCONTINUED] amoxicillin-clavulanate (AUGMENTIN) 875-125 MG tablet Take 1 tablet by mouth 2 (two) times daily. (Patient not taking: Reported on 09/30/2021)  ? [DISCONTINUED] chlorpheniramine-HYDROcodone (TUSSIONEX PENNKINETIC ER) 10-8 MG/5ML Take 5 mLs by mouth every 12 (twelve) hours as needed. (Patient not taking: Reported on 09/30/2021)  ? [DISCONTINUED] predniSONE (DELTASONE) 10 MG tablet 6 tablets on Day 1 , then reduce by 1 tablet daily until gone (Patient not taking: Reported on 09/30/2021)  ? ?No facility-administered encounter medications on file as of 09/30/2021.  ? ? ?

## 2021-09-30 NOTE — Assessment & Plan Note (Signed)
She has lowered her A1c with  A low  glycemic index diet and had intentionally lost weight with regular participation in an aerobic activity but has gained weight since last year due to  Increased responsibilities resulting in less regular exercise.  Encouraged to resume a program of exercise   ? ?Lab Results  ?Component Value Date  ? HGBA1C 5.9 04/01/2021  ? ? ?

## 2021-09-30 NOTE — Assessment & Plan Note (Signed)
Using zyrtec bid,  Flonase,   And saline irrigation s ?

## 2021-09-30 NOTE — Assessment & Plan Note (Addendum)
Home readings are 130/80 ,  Not as low as here today.  Continue current losartan dose  ?

## 2021-09-30 NOTE — Assessment & Plan Note (Signed)

## 2021-10-02 NOTE — Assessment & Plan Note (Signed)
10 yr risk using the AHA CRC is 2%  ? ?Lab Results  ?Component Value Date  ? CHOL 239 (H) 09/30/2021  ? HDL 59.60 09/30/2021  ? LDLCALC 156 (H) 09/30/2021  ? LDLDIRECT 155.0 01/22/2017  ? TRIG 115.0 09/30/2021  ? CHOLHDL 4 09/30/2021  ? ?

## 2021-10-04 ENCOUNTER — Other Ambulatory Visit: Payer: Self-pay | Admitting: Internal Medicine

## 2021-10-14 ENCOUNTER — Other Ambulatory Visit: Payer: Self-pay | Admitting: Internal Medicine

## 2021-10-26 DIAGNOSIS — K648 Other hemorrhoids: Secondary | ICD-10-CM | POA: Diagnosis not present

## 2021-10-26 DIAGNOSIS — Z8371 Family history of colonic polyps: Secondary | ICD-10-CM | POA: Diagnosis not present

## 2021-10-26 DIAGNOSIS — Z800 Family history of malignant neoplasm of digestive organs: Secondary | ICD-10-CM | POA: Diagnosis not present

## 2021-10-26 DIAGNOSIS — Z1211 Encounter for screening for malignant neoplasm of colon: Secondary | ICD-10-CM | POA: Diagnosis not present

## 2021-10-26 DIAGNOSIS — K5730 Diverticulosis of large intestine without perforation or abscess without bleeding: Secondary | ICD-10-CM | POA: Diagnosis not present

## 2021-12-05 LAB — HM COLONOSCOPY

## 2021-12-14 ENCOUNTER — Other Ambulatory Visit: Payer: Self-pay | Admitting: Internal Medicine

## 2021-12-26 ENCOUNTER — Encounter: Payer: Self-pay | Admitting: Internal Medicine

## 2021-12-26 ENCOUNTER — Ambulatory Visit (INDEPENDENT_AMBULATORY_CARE_PROVIDER_SITE_OTHER): Payer: 59 | Admitting: Internal Medicine

## 2021-12-26 DIAGNOSIS — F5105 Insomnia due to other mental disorder: Secondary | ICD-10-CM

## 2021-12-26 DIAGNOSIS — F419 Anxiety disorder, unspecified: Secondary | ICD-10-CM

## 2021-12-26 DIAGNOSIS — F411 Generalized anxiety disorder: Secondary | ICD-10-CM

## 2021-12-26 DIAGNOSIS — R0989 Other specified symptoms and signs involving the circulatory and respiratory systems: Secondary | ICD-10-CM

## 2021-12-26 DIAGNOSIS — R69 Illness, unspecified: Secondary | ICD-10-CM | POA: Diagnosis not present

## 2021-12-26 MED ORDER — ALPRAZOLAM 0.5 MG PO TABS: 0.5000 mg | ORAL_TABLET | Freq: Two times a day (BID) | ORAL | 3 refills | Status: DC | PRN

## 2021-12-26 MED ORDER — PAROXETINE HCL ER 25 MG PO TB24: 25.0000 mg | ORAL_TABLET | Freq: Every day | ORAL | 2 refills | Status: DC

## 2021-12-26 MED ORDER — PREDNISONE 10 MG PO TABS: ORAL_TABLET | ORAL | 0 refills | Status: DC

## 2021-12-26 MED ORDER — LEVOFLOXACIN 500 MG PO TABS: 500.0000 mg | ORAL_TABLET | Freq: Every day | ORAL | 0 refills | Status: AC

## 2021-12-26 MED ORDER — ALPRAZOLAM 0.5 MG PO TABS
0.5000 mg | ORAL_TABLET | Freq: Two times a day (BID) | ORAL | 3 refills | Status: DC | PRN
Start: 2021-12-26 — End: 2022-05-30

## 2021-12-26 NOTE — Progress Notes (Signed)
Subjective:  Patient ID: Dawn Griffith, female    DOB: 1966-02-10  Age: 56 y.o. MRN: [id number]  CC: Diagnoses of Insomnia secondary to anxiety, Generalized anxiety disorder, and Globus sensation were pertinent to this visit.   HPI Dawn Griffith presents for persistent throat discomfort when swallowing  , for the past week.  Denies fevers, lymphadenopathy. Through maybe she scratched it with a Wheat Thin,  but did not choke.  No improvement with gargling.  The feeling is on and off.  No sick contacts.  No recent instrumentation.   Does not snore or mouth breathe.  Anxiety:  feels irritable,  anxious,  trying to talk herself out of panic attacks.  House is a mess.   Caregiver stress:  both parents . Father has bladder cancer?   Daughter Meghan miscarried in March,  is ow pregnant again. Husband Is unemployed.  .  Patient is scheduled to cruise to [place]  with her aunt who has ESKD and is on dialysis   Using clonazepam  0.5 mg once daily,   has tried 1/2 and whole dose.  No effect.  Wants to resume alprazolam .  Still using ambien and amitriptyline for chronic insomnia.   Last HTN check April : no change to losartan     Outpatient Medications Prior to Visit  Medication Sig Dispense Refill   amitriptyline (ELAVIL) 100 MG tablet TAKE ONE TABLET BY MOUTH EVERY NIGHT AT BEDTIME 90 tablet 1   Ascorbic Acid (VITAMIN C) 1000 MG tablet Take 1,000 mg by mouth daily.     cetirizine (ZYRTEC) 10 MG tablet Take 10 mg by mouth daily.     cholecalciferol (VITAMIN D) 1000 units tablet Take 1,000 Units by mouth daily.     docusate sodium (COLACE) 100 MG capsule Take 100 mg by mouth 2 (two) times daily.     Lactobacillus (PROBIOTIC ACIDOPHILUS PO) Take 1 tablet by mouth daily.     losartan (COZAAR) 100 MG tablet TAKE ONE TABLET BY MOUTH DAILY 90 tablet 3   lubiprostone (AMITIZA) 24 MCG capsule Take 1 capsule (24 mcg total) by mouth 2 (two) times daily with a meal. 60 capsule 5   mometasone (NASONEX) 50  MCG/ACT nasal spray Place 2 sprays into the nose daily. 1 each 12   Multiple Vitamin (MULTI-VITAMIN DAILY PO) Take by mouth.     omeprazole (PRILOSEC) 40 MG capsule TAKE ONE CAPSULE BY MOUTH DAILY 90 capsule 3   spironolactone (ALDACTONE) 25 MG tablet TAKE ONE TABLET BY MOUTH DAILY 90 tablet 3   vitamin B-12 (CYANOCOBALAMIN) 1000 MCG tablet Take 1,000 mcg by mouth daily.     zolpidem (AMBIEN CR) 12.5 MG CR tablet TAKE ONE TABLET BY MOUTH EVERY NIGHT AT BEDTIME AS NEEDED FOR SLEEP 30 tablet 3   clonazePAM (KLONOPIN) 0.5 MG tablet TAKE ONE TABLET BY MOUTH DAILY AS NEEDED FOR ANXIETY 30 tablet 3   FLUoxetine (PROZAC) 40 MG capsule Take 1 capsule (40 mg total) by mouth daily. 30 capsule 2   No facility-administered medications prior to visit.    Review of Systems;  Patient denies headache, fevers, malaise, unintentional weight loss, skin rash, eye pain, sinus congestion and sinus pain, sore throat, dysphagia,  hemoptysis , cough, dyspnea, wheezing, chest pain, palpitations, orthopnea, edema, abdominal pain, nausea, melena, diarrhea, constipation, flank pain, dysuria, hematuria, urinary  Frequency, nocturia, numbness, tingling, seizures,  Focal weakness, Loss of consciousness,  Tremor, insomnia, depression, anxiety, and suicidal ideation.      Objective:  BP 120/76 (BP Location: Left Arm, Patient Position: Sitting, Cuff Size: Normal)   Pulse (!) 101   Temp 99.2 F (37.3 C) (Oral)   Ht '5\' 9"'$  (1.753 m)   Wt 188 lb 6.4 oz (85.5 kg)   LMP 05/01/2014 (Approximate)   SpO2 99%   BMI 27.82 kg/m   BP Readings from Last 3 Encounters:  12/26/21 120/76  09/30/21 98/72  05/08/21 (!) 100/57    Wt Readings from Last 3 Encounters:  12/26/21 188 lb 6.4 oz (85.5 kg)  09/30/21 188 lb 12.8 oz (85.6 kg)  09/06/21 186 lb (84.4 kg)    General appearance: alert, cooperative and appears stated age Ears: normal TM's and external ear canals both ears Throat: lips, mucosa, and tongue normal; teeth and  gums normal Neck: no adenopathy, no carotid bruit, supple, symmetrical, trachea midline and thyroid not enlarged, symmetric, no tenderness/mass/nodules Back: symmetric, no curvature. ROM normal. No CVA tenderness. Lungs: clear to auscultation bilaterally Heart: regular rate and rhythm, S1, S2 normal, no murmur, click, rub or gallop Abdomen: soft, non-tender; bowel sounds normal; no masses,  no organomegaly Pulses: 2+ and symmetric Skin: Skin color, texture, turgor normal. No rashes or lesions Lymph nodes: Cervical, supraclavicular, and axillary nodes normal.  Lab Results  Component Value Date   HGBA1C 5.9 09/30/2021   HGBA1C 5.9 04/01/2021   HGBA1C 5.9 12/01/2020    Lab Results  Component Value Date   CREATININE 0.79 09/30/2021   CREATININE 0.83 04/01/2021   CREATININE 0.87 12/01/2020    Lab Results  Component Value Date   WBC 11.9 (H) 08/05/2018   HGB 14.3 08/05/2018   HCT 43.9 08/05/2018   PLT 399 08/05/2018   GLUCOSE 104 (H) 09/30/2021   CHOL 239 (H) 09/30/2021   TRIG 115.0 09/30/2021   HDL 59.60 09/30/2021   LDLDIRECT 155.0 01/22/2017   LDLCALC 156 (H) 09/30/2021   ALT 18 09/30/2021   AST 18 09/30/2021   NA 137 09/30/2021   K 4.0 09/30/2021   CL 102 09/30/2021   CREATININE 0.79 09/30/2021   BUN 12 09/30/2021   CO2 28 09/30/2021   TSH 1.00 09/30/2021   HGBA1C 5.9 09/30/2021   MICROALBUR <0.7 04/01/2021    No results found.  Assessment & Plan:   Problem List Items Addressed This Visit     Generalized anxiety disorder    No improvement with clonazepam 0.5 mg and prozac 40 mg .  counselling given..  Recommending change in SSRI to  paxil CR and alprazolam bid prn .  Suggested that she stop watching shows that center on murder and the supernatural, and instead focus on creating 30 minutes of quiet time daily .  List of therapists given        Relevant Medications   PARoxetine (PAXIL CR) 25 MG 24 hr tablet   ALPRAZolam (XANAX) 0.5 MG tablet   Insomnia  secondary to anxiety    Managed with elavil and ambien.  No changes today.  The risks and benefits of ambien use  were discussed with patient today including excessive sedation leading to respiratory depression,  impaired thinking/driving, and dependence .  Patient was advised to avoid concurrent use with alcohol, to use medication only as needed and not to share with others  .       Relevant Medications   PARoxetine (PAXIL CR) 25 MG 24 hr tablet   ALPRAZolam (XANAX) 0.5 MG tablet   Globus sensation    Aggravated by her anxiety.  HEENT exam normal.  I spent a total of  30 minutes with this patient in a face to face visit on the date of this encounter reviewing the last office visit with me  in April,  counselling her on ways to manage her anxiety  non pharmacologically ,   and post visit ordering of testing and therapeutics.    Follow-up: No follow-ups on file.   Crecencio Mc, MD

## 2021-12-26 NOTE — Assessment & Plan Note (Signed)
Managed with elavil and ambien.  No changes today.  The risks and benefits of ambien use  were discussed with patient today including excessive sedation leading to respiratory depression,  impaired thinking/driving, and dependence .  Patient was advised to avoid concurrent use with alcohol, to use medication only as needed and not to share with others  .

## 2021-12-26 NOTE — Assessment & Plan Note (Addendum)
No improvement with clonazepam 0.5 mg and prozac 40 mg .  counselling given..  Recommending change in SSRI to  paxil CR and alprazolam bid prn .  Suggested that she stop watching shows that center on murder and the supernatural, and instead focus on creating 30 minutes of quiet time daily .  List of therapists given

## 2021-12-26 NOTE — Patient Instructions (Addendum)
I am changing Prozac to Paxil CR.  If it's too expensive,  do not fill and notify me so I can call in the other Paxil    Changing clonazepam to alprazolam.  Do not use more than two daily   You need QUIET TIME .  No more murder shows,  no more hauntings   Use your fountain and find meditation music to create a 30 minute break    Gargle with salt water several times daily for the sore throat.

## 2021-12-26 NOTE — Assessment & Plan Note (Signed)
Aggravated by her anxiety.  HEENT exam normal.

## 2021-12-28 ENCOUNTER — Other Ambulatory Visit: Payer: Self-pay | Admitting: Internal Medicine

## 2022-01-16 ENCOUNTER — Other Ambulatory Visit: Payer: Self-pay

## 2022-01-16 MED ORDER — AMITRIPTYLINE HCL 100 MG PO TABS: 100.0000 mg | ORAL_TABLET | Freq: Every day | ORAL | 1 refills | Status: DC

## 2022-02-18 ENCOUNTER — Other Ambulatory Visit: Payer: Self-pay | Admitting: Internal Medicine

## 2022-03-17 ENCOUNTER — Other Ambulatory Visit: Payer: Self-pay | Admitting: Internal Medicine

## 2022-03-17 DIAGNOSIS — Z1231 Encounter for screening mammogram for malignant neoplasm of breast: Secondary | ICD-10-CM

## 2022-04-01 ENCOUNTER — Other Ambulatory Visit: Payer: Self-pay | Admitting: Internal Medicine

## 2022-04-10 ENCOUNTER — Encounter: Payer: Self-pay | Admitting: Internal Medicine

## 2022-04-11 ENCOUNTER — Encounter: Payer: Self-pay | Admitting: Family Medicine

## 2022-04-11 ENCOUNTER — Ambulatory Visit: Payer: 59 | Admitting: Family Medicine

## 2022-04-11 VITALS — BP 132/78 | HR 92 | Temp 98.5°F | Ht 69.0 in | Wt 187.8 lb

## 2022-04-11 DIAGNOSIS — R2231 Localized swelling, mass and lump, right upper limb: Secondary | ICD-10-CM

## 2022-04-11 NOTE — Patient Instructions (Addendum)
It was a pleasure meeting you today. Thank you for allowing me to take part in your health care.  Our goals for today as we discussed include:  For your right hand I think the swelling is likely from increase in sodium content and increased activity during the day. I'm glad it is improving and not having any pain or worsening swelling. Recommend to do gentle exercises to promote circulation  Recommend to decrease sodium content, can journal diet to see how much sodium you are consuming if you are curious.  Recommended daily intake is less than 1500 2000 mg.   Please follow-up with PCP as scheduled or sooner if symptoms worsen  If you have any questions or concerns, please do not hesitate to call the office at [phone].  I look forward to our next visit and until then take care and stay safe.  Regards,   Carollee Leitz, MD   [place]   Low-Sodium Eating Plan Sodium, which is an element that makes up salt, helps you maintain a healthy balance of fluids in your body. Too much sodium can increase your blood pressure and cause fluid and waste to be held in your body. Your health care provider or dietitian may recommend following this plan if you have high blood pressure (hypertension), kidney disease, liver disease, or heart failure. Eating less sodium can help lower your blood pressure, reduce swelling, and protect your heart, liver, and kidneys. What are tips for following this plan? Reading food labels The Nutrition Facts label lists the amount of sodium in one serving of the food. If you eat more than one serving, you must multiply the listed amount of sodium by the number of servings. Choose foods with less than 140 mg of sodium per serving. Avoid foods with 300 mg of sodium or more per serving. Shopping  Look for lower-sodium products, often labeled as "low-sodium" or "no salt added." Always check the sodium content, even if foods are labeled as "unsalted" or "no  salt added." Buy fresh foods. Avoid canned foods and pre-made or frozen meals. Avoid canned, cured, or processed meats. Buy breads that have less than 80 mg of sodium per slice. Cooking  Eat more home-cooked food and less restaurant, buffet, and fast food. Avoid adding salt when cooking. Use salt-free seasonings or herbs instead of table salt or sea salt. Check with your health care provider or pharmacist before using salt substitutes. Cook with plant-based oils, such as canola, sunflower, or olive oil. Meal planning When eating at a restaurant, ask that your food be prepared with less salt or no salt, if possible. Avoid dishes labeled as brined, pickled, cured, smoked, or made with soy sauce, miso, or teriyaki sauce. Avoid foods that contain MSG (monosodium glutamate). MSG is sometimes added to [place] food, bouillon, and some canned foods. Make meals that can be grilled, baked, poached, roasted, or steamed. These are generally made with less sodium. General information Most people on this plan should limit their sodium intake to 1,500-2,000 mg (milligrams) of sodium each day. What foods should I eat? Fruits Fresh, frozen, or canned fruit. Fruit juice. Vegetables Fresh or frozen vegetables. "No salt added" canned vegetables. "No salt added" tomato sauce and paste. Low-sodium or reduced-sodium tomato and vegetable juice. Grains Low-sodium cereals, including oats, puffed wheat and rice, and shredded wheat. Low-sodium crackers. Unsalted rice. Unsalted pasta. Low-sodium bread. Whole-grain breads and whole-grain pasta. Meats and other proteins Fresh or frozen (no salt added) meat, poultry, seafood, and  fish. Low-sodium canned tuna and salmon. Unsalted nuts. Dried peas, beans, and lentils without added salt. Unsalted canned beans. Eggs. Unsalted nut butters. Dairy Milk. Soy milk. Cheese that is naturally low in sodium, such as ricotta cheese, fresh mozzarella, or Swiss cheese. Low-sodium or  reduced-sodium cheese. Cream cheese. Yogurt. Seasonings and condiments Fresh and dried herbs and spices. Salt-free seasonings. Low-sodium mustard and ketchup. Sodium-free salad dressing. Sodium-free light mayonnaise. Fresh or refrigerated horseradish. Lemon juice. Vinegar. Other foods Homemade, reduced-sodium, or low-sodium soups. Unsalted popcorn and pretzels. Low-salt or salt-free chips. The items listed above may not be a complete list of foods and beverages you can eat. Contact a dietitian for more information. What foods should I avoid? Vegetables Sauerkraut, pickled vegetables, and relishes. Olives. [place] fries. Onion rings. Regular canned vegetables (not low-sodium or reduced-sodium). Regular canned tomato sauce and paste (not low-sodium or reduced-sodium). Regular tomato and vegetable juice (not low-sodium or reduced-sodium). Frozen vegetables in sauces. Grains Instant hot cereals. Bread stuffing, pancake, and biscuit mixes. Croutons. Seasoned rice or pasta mixes. Noodle soup cups. Boxed or frozen macaroni and cheese. Regular salted crackers. Self-rising flour. Meats and other proteins Meat or fish that is salted, canned, smoked, spiced, or pickled. Precooked or cured meat, such as sausages or meat loaves. Berniece Salines. Ham. Pepperoni. Hot dogs. Corned beef. Chipped beef. Salt pork. Jerky. Pickled herring. Anchovies and sardines. Regular canned tuna. Salted nuts. Dairy Processed cheese and cheese spreads. Hard cheeses. Cheese curds. Blue cheese. Feta cheese. String cheese. Regular cottage cheese. Buttermilk. Canned milk. Fats and oils Salted butter. Regular margarine. Ghee. Bacon fat. Seasonings and condiments Onion salt, garlic salt, seasoned salt, table salt, and sea salt. Canned and packaged gravies. Worcestershire sauce. Tartar sauce. Barbecue sauce. Teriyaki sauce. Soy sauce, including reduced-sodium. Steak sauce. Fish sauce. Oyster sauce. Cocktail sauce. Horseradish that you find on the  shelf. Regular ketchup and mustard. Meat flavorings and tenderizers. Bouillon cubes. Hot sauce. Pre-made or packaged marinades. Pre-made or packaged taco seasonings. Relishes. Regular salad dressings. Salsa. Other foods Salted popcorn and pretzels. Corn chips and puffs. Potato and tortilla chips. Canned or dried soups. Pizza. Frozen entrees and pot pies. The items listed above may not be a complete list of foods and beverages you should avoid. Contact a dietitian for more information. Summary Eating less sodium can help lower your blood pressure, reduce swelling, and protect your heart, liver, and kidneys. Most people on this plan should limit their sodium intake to 1,500-2,000 mg (milligrams) of sodium each day. Canned, boxed, and frozen foods are high in sodium. Restaurant foods, fast foods, and pizza are also very high in sodium. You also get sodium by adding salt to food. Try to cook at home, eat more fresh fruits and vegetables, and eat less fast food and canned, processed, or prepared foods. This information is not intended to replace advice given to you by your health care provider. Make sure you discuss any questions you have with your health care provider. Document Revised: 06/27/2019 Document Reviewed: 04/23/2019 Elsevier Patient Education  [place].

## 2022-04-11 NOTE — Progress Notes (Signed)
    SUBJECTIVE:   CHIEF COMPLAINT / HPI: right hand swelling  Concerned for right hand swelling.  Has now resolved.  Symptoms started 2 nights ago  after sleeping on hans.  Woke up following morning with right hand pain and worsening swelling.  Denies any fevers, decrease in sensation, numbness/tingling, or weakness.  No trauma or increase in upper arm activity.  Endorses having gone to the store the day before.  Ate foods that were high in salt content as well as drinking 4-6 bottles of carbonated water, also high in sodium, daily.  Not taking NSAIDs regularly.  Husband told her she should get checked for insect bite as it also causes swelling  PERTINENT  PMH / PSH:  Anxiety HTN   OBJECTIVE:   BP 132/78 (BP Location: Left Arm, Patient Position: Sitting, Cuff Size: Normal)   Pulse 92   Temp 98.5 F (36.9 C) (Oral)   Ht '5\' 9"'$  (1.753 m)   Wt 187 lb 12.8 oz (85.2 kg)   LMP 05/01/2014 (Approximate)   SpO2 99%   BMI 27.73 kg/m    General: Alert, no acute distress Cardio: Normal S1 and S2, RRR, no r/m/g Pulm: CTAB, normal work of breathing Bilateral hand exam: No ecchymosis, erythema, puncture wounds, or edema. FROM of wrist and finger joints.  Sensation and Motor intact. Normal grip.  Strength 5/5  NVS intact.  Negative Phalen's sign, Negative Tinel's sign   ASSESSMENT/PLAN:   Localized swelling on right hand Symptoms have resolved.  Suspect high sodium content and increased pressure overnight as possible contributing factors of recent edema of hand.  -Recommend limit salt intake -Limit carbonated water with high sodium -Follow up with PCP as scheduled or sooner if symptoms worsen    PDMP Reviewed  Carollee Leitz, MD

## 2022-04-21 ENCOUNTER — Ambulatory Visit: Payer: 59 | Admitting: Internal Medicine

## 2022-04-21 ENCOUNTER — Encounter: Payer: Self-pay | Admitting: Internal Medicine

## 2022-04-21 ENCOUNTER — Ambulatory Visit (INDEPENDENT_AMBULATORY_CARE_PROVIDER_SITE_OTHER): Payer: 59

## 2022-04-21 VITALS — BP 128/80 | HR 97 | Temp 98.3°F | Resp 17 | Ht 69.0 in | Wt 189.2 lb

## 2022-04-21 DIAGNOSIS — M25561 Pain in right knee: Secondary | ICD-10-CM

## 2022-04-21 DIAGNOSIS — S161XXA Strain of muscle, fascia and tendon at neck level, initial encounter: Secondary | ICD-10-CM | POA: Diagnosis not present

## 2022-04-21 DIAGNOSIS — M25562 Pain in left knee: Secondary | ICD-10-CM | POA: Diagnosis not present

## 2022-04-21 MED ORDER — PREDNISONE 10 MG PO TABS: ORAL_TABLET | ORAL | 0 refills | Status: DC

## 2022-04-21 MED ORDER — CYCLOBENZAPRINE HCL 5 MG PO TABS: 5.0000 mg | ORAL_TABLET | Freq: Three times a day (TID) | ORAL | 1 refills | Status: DC | PRN

## 2022-04-21 NOTE — Progress Notes (Unsigned)
Subjective:  Patient ID: Dawn Griffith, female    DOB: 18-Jan-1966  Age: 56 y.o. MRN: [id number]  CC: There were no encounter diagnoses.   HPI Dawn Griffith presents for  Chief Complaint  Patient presents with   Follow-up   Pain:  left sided neck pain,   right  leg pain for the past several weeks.   Started last week  with stabbing pain in right buttock that radiated to right knee but not below the knee .  Pain in buttock has resolved.   Currently the left side of her neck posteriorly is tight and painful with lying on her back improved with sitting .  Aggravated by turning head either side  Taking advil/tylenol.  Has tied aplying heat or ice      Outpatient Medications Prior to Visit  Medication Sig Dispense Refill   ALPRAZolam (XANAX) 0.5 MG tablet Take 1 tablet (0.5 mg total) by mouth 2 (two) times daily as needed for sleep or anxiety. 60 tablet 3   amitriptyline (ELAVIL) 100 MG tablet Take 1 tablet (100 mg total) by mouth at bedtime. 90 tablet 1   Ascorbic Acid (VITAMIN C) 1000 MG tablet Take 1,000 mg by mouth daily.     cetirizine (ZYRTEC) 10 MG tablet Take 10 mg by mouth daily.     cholecalciferol (VITAMIN D) 1000 units tablet Take 1,000 Units by mouth daily.     docusate sodium (COLACE) 100 MG capsule Take 100 mg by mouth 2 (two) times daily.     Lactobacillus (PROBIOTIC ACIDOPHILUS PO) Take 1 tablet by mouth daily.     losartan (COZAAR) 100 MG tablet TAKE ONE TABLET BY MOUTH DAILY 90 tablet 3   lubiprostone (AMITIZA) 24 MCG capsule Take 1 capsule (24 mcg total) by mouth 2 (two) times daily with a meal. 60 capsule 5   Multiple Vitamin (MULTI-VITAMIN DAILY PO) Take by mouth.     omeprazole (PRILOSEC) 40 MG capsule TAKE ONE CAPSULE BY MOUTH DAILY 90 capsule 3   PARoxetine (PAXIL-CR) 25 MG 24 hr tablet TAKE 1 TABLET BY MOUTH DAILY 30 tablet 2   spironolactone (ALDACTONE) 25 MG tablet TAKE ONE TABLET BY MOUTH DAILY 90 tablet 3   vitamin B-12 (CYANOCOBALAMIN) 1000 MCG tablet  Take 1,000 mcg by mouth daily.     zolpidem (AMBIEN CR) 12.5 MG CR tablet TAKE ONE TABLET BY MOUTH EVERY NIGHT AT BEDTIME AS NEEDED FOR SLEEP 30 tablet 3   mometasone (NASONEX) 50 MCG/ACT nasal spray Place 2 sprays into the nose daily. 1 each 12   predniSONE (DELTASONE) 10 MG tablet 6 tablets on Day 1 , then reduce by 1 tablet daily until gone 21 tablet 0   No facility-administered medications prior to visit.    Review of Systems;  Patient denies headache, fevers, malaise, unintentional weight loss, skin rash, eye pain, sinus congestion and sinus pain, sore throat, dysphagia,  hemoptysis , cough, dyspnea, wheezing, chest pain, palpitations, orthopnea, edema, abdominal pain, nausea, melena, diarrhea, constipation, flank pain, dysuria, hematuria, urinary  Frequency, nocturia, numbness, tingling, seizures,  Focal weakness, Loss of consciousness,  Tremor, insomnia, depression, anxiety, and suicidal ideation.      Objective:  BP (!) 130/92 (BP Location: Left Arm, Patient Position: Sitting, Cuff Size: Large)   Pulse 97   Temp 98.3 F (36.8 C) (Temporal)   Resp 17   Ht '5\' 9"'$  (1.753 m)   Wt 189 lb 3.2 oz (85.8 kg)   LMP 05/01/2014 (Approximate)  SpO2 98%   BMI 27.94 kg/m   BP Readings from Last 3 Encounters:  04/21/22 (!) 130/92  04/11/22 132/78  12/26/21 120/76    Wt Readings from Last 3 Encounters:  04/21/22 189 lb 3.2 oz (85.8 kg)  04/11/22 187 lb 12.8 oz (85.2 kg)  12/26/21 188 lb 6.4 oz (85.5 kg)    General appearance: alert, cooperative and appears stated age Ears: normal TM's and external ear canals both ears Throat: lips, mucosa, and tongue normal; teeth and gums normal Neck: no adenopathy, no carotid bruit, supple, symmetrical, trachea midline and thyroid not enlarged, symmetric, no tenderness/mass/nodules Back: symmetric, no curvature. ROM normal. No CVA tenderness. Lungs: clear to auscultation bilaterally Heart: regular rate and rhythm, S1, S2 normal, no murmur,  click, rub or gallop Abdomen: soft, non-tender; bowel sounds normal; no masses,  no organomegaly Pulses: 2+ and symmetric Skin: Skin color, texture, turgor normal. No rashes or lesions Lymph nodes: Cervical, supraclavicular, and axillary nodes normal. Neuro:  awake and interactive with normal mood and affect. Higher cortical functions are normal. Speech is clear without word-finding difficulty or dysarthria. Extraocular movements are intact. Visual fields of both eyes are grossly intact. Sensation to light touch is grossly intact bilaterally of upper and lower extremities. Motor examination shows 4+/5 symmetric hand grip and upper extremity and 5/5 lower extremity strength. There is no pronation or drift. Gait is non-ataxic   Lab Results  Component Value Date   HGBA1C 5.9 09/30/2021   HGBA1C 5.9 04/01/2021   HGBA1C 5.9 12/01/2020    Lab Results  Component Value Date   CREATININE 0.79 09/30/2021   CREATININE 0.83 04/01/2021   CREATININE 0.87 12/01/2020    Lab Results  Component Value Date   WBC 11.9 (H) 08/05/2018   HGB 14.3 08/05/2018   HCT 43.9 08/05/2018   PLT 399 08/05/2018   GLUCOSE 104 (H) 09/30/2021   CHOL 239 (H) 09/30/2021   TRIG 115.0 09/30/2021   HDL 59.60 09/30/2021   LDLDIRECT 155.0 01/22/2017   LDLCALC 156 (H) 09/30/2021   ALT 18 09/30/2021   AST 18 09/30/2021   NA 137 09/30/2021   K 4.0 09/30/2021   CL 102 09/30/2021   CREATININE 0.79 09/30/2021   BUN 12 09/30/2021   CO2 28 09/30/2021   TSH 1.00 09/30/2021   HGBA1C 5.9 09/30/2021   MICROALBUR <0.7 04/01/2021    No results found.  Assessment & Plan:   Problem List Items Addressed This Visit   None   I spent a total of   minutes with this patient in a face to face visit on the date of this encounter reviewing the last office visit with me in       ,  most recent visit with cardiology ,    ,  patient's diet and exercise habits, home blood pressure /blod sugar readings, recent ER visit including labs  and imaging studies ,   and post visit ordering of testing and therapeutics.    Follow-up: No follow-ups on file.   Crecencio Mc, MD

## 2022-04-21 NOTE — Patient Instructions (Signed)
I believe you  have strained your sterocleidomastoid muscle (somehow)  and you have a touch of bursitis in the knee.   1)  Suspend the advil for a week because I am placing you on a prednisone taper (which will also help the knee)  2) stop  the percogesic:  use  flexeril (cyclobenzaprine) ith plain tylenol to help relax the spasming muscle in your neck  s

## 2022-04-23 ENCOUNTER — Encounter: Payer: Self-pay | Admitting: Family Medicine

## 2022-04-23 DIAGNOSIS — S161XXA Strain of muscle, fascia and tendon at neck level, initial encounter: Secondary | ICD-10-CM | POA: Insufficient documentation

## 2022-04-23 DIAGNOSIS — R2231 Localized swelling, mass and lump, right upper limb: Secondary | ICD-10-CM | POA: Insufficient documentation

## 2022-04-23 NOTE — Assessment & Plan Note (Signed)
Symptoms have resolved.  Suspect high sodium content and increased pressure overnight as possible contributing factors of recent edema of hand.  -Recommend limit salt intake -Limit carbonated water with high sodium -Follow up with PCP as scheduled or sooner if symptoms worsen

## 2022-04-23 NOTE — Assessment & Plan Note (Signed)
alternating heat and ice , tylenol,  and prednisone taper

## 2022-04-23 NOTE — Assessment & Plan Note (Signed)
Plain films are normal.  Will recommend orthopedics consult vs MRI to evaluate for ACL tear or ligaent strain .

## 2022-04-25 ENCOUNTER — Other Ambulatory Visit: Payer: Self-pay | Admitting: Internal Medicine

## 2022-04-26 NOTE — Telephone Encounter (Signed)
Refilled: 12/15/2021 Last OV: 04/21/2022 Next OV: 05/12/2022

## 2022-05-01 ENCOUNTER — Ambulatory Visit
Admission: RE | Admit: 2022-05-01 | Discharge: 2022-05-01 | Disposition: A | Discharge status: Home / Self Care | Payer: 59 | Source: Ambulatory Visit | Attending: Internal Medicine | Admitting: Internal Medicine

## 2022-05-01 DIAGNOSIS — Z1231 Encounter for screening mammogram for malignant neoplasm of breast: Secondary | ICD-10-CM | POA: Insufficient documentation

## 2022-05-02 ENCOUNTER — Other Ambulatory Visit: Payer: Self-pay | Admitting: Internal Medicine

## 2022-05-02 DIAGNOSIS — R928 Other abnormal and inconclusive findings on diagnostic imaging of breast: Secondary | ICD-10-CM

## 2022-05-02 DIAGNOSIS — Z6827 Body mass index (BMI) 27.0-27.9, adult: Secondary | ICD-10-CM | POA: Diagnosis not present

## 2022-05-02 DIAGNOSIS — I10 Essential (primary) hypertension: Secondary | ICD-10-CM | POA: Diagnosis not present

## 2022-05-02 DIAGNOSIS — N6489 Other specified disorders of breast: Secondary | ICD-10-CM

## 2022-05-02 DIAGNOSIS — R3129 Other microscopic hematuria: Secondary | ICD-10-CM | POA: Diagnosis not present

## 2022-05-12 ENCOUNTER — Encounter: Payer: Self-pay | Admitting: Internal Medicine

## 2022-05-12 ENCOUNTER — Ambulatory Visit: Payer: 59 | Admitting: Internal Medicine

## 2022-05-12 VITALS — BP 102/68 | HR 114 | Temp 99.7°F | Ht 69.0 in | Wt 190.6 lb

## 2022-05-12 DIAGNOSIS — J069 Acute upper respiratory infection, unspecified: Secondary | ICD-10-CM

## 2022-05-12 DIAGNOSIS — J209 Acute bronchitis, unspecified: Secondary | ICD-10-CM | POA: Diagnosis not present

## 2022-05-12 LAB — POCT INFLUENZA A/B

## 2022-05-12 LAB — POC COVID19 BINAXNOW

## 2022-05-12 MED ORDER — PREDNISONE 10 MG PO TABS: ORAL_TABLET | ORAL | 0 refills | Status: DC

## 2022-05-12 MED ORDER — HYDROCOD POLI-CHLORPHE POLI ER 10-8 MG/5ML PO SUER: 5.0000 mL | Freq: Two times a day (BID) | ORAL | 0 refills | Status: DC | PRN

## 2022-05-12 MED ORDER — AMOXICILLIN-POT CLAVULANATE 875-125 MG PO TABS: 1.0000 | ORAL_TABLET | Freq: Two times a day (BID) | ORAL | 0 refills | Status: DC

## 2022-05-12 NOTE — Patient Instructions (Signed)
You have a viral syndrome which is causing bronchitis.    The post nasal drip may alsoe be contributing to  your  Cough.  I am prescribing a prednisone taper to manage the inflammation in your bronchial tubes  and Tussionex for the cough   I also advise use of the following OTC meds to help with your other symptoms.   Take generic OTC benadryl 25 mg  At bedtime for the drainage,,  Delsym for daytime cough. Mucinex (guaifenesin) for he "rubber cement snot"  flush your sinuses twice daily with Simply Saline   If the coughing has not impoved in  48 hours  OR  if you develop T > 100.4,  brown/bloody  nasal discharge,  ear or sinus pain  start the antibiotic (augmentin) .  If you start the augmentin,  Please take a probiotic ( Align, Floraque or Culturelle) while you are on the antibiotic to prevent a serious antibiotic associated diarrhea  Called clostridium dificile colitis and a vaginal yeast infection

## 2022-05-12 NOTE — Progress Notes (Unsigned)
Subjective:  Patient ID: Dawn Griffith, female    DOB: 11-Mar-1966  Age: 56 y.o. MRN: [id number]  CC: There were no encounter diagnoses.   HPI Dawn Griffith presents for  Chief Complaint  Patient presents with   Follow-up    Follow up    sinus congestion,  persistent cough,  yellow sputum .  Started Monday.  No fevers,  no bloody discharge. No fevers.   Daughter Jinny Blossom due March 3    Outpatient Medications Prior to Visit  Medication Sig Dispense Refill   ALPRAZolam (XANAX) 0.5 MG tablet Take 1 tablet (0.5 mg total) by mouth 2 (two) times daily as needed for sleep or anxiety. 60 tablet 3   amitriptyline (ELAVIL) 100 MG tablet Take 1 tablet (100 mg total) by mouth at bedtime. 90 tablet 1   Ascorbic Acid (VITAMIN C) 1000 MG tablet Take 1,000 mg by mouth daily.     cetirizine (ZYRTEC) 10 MG tablet Take 10 mg by mouth daily.     cholecalciferol (VITAMIN D) 1000 units tablet Take 1,000 Units by mouth daily.     cyclobenzaprine (FLEXERIL) 5 MG tablet Take 1 tablet (5 mg total) by mouth 3 (three) times daily as needed for muscle spasms. 30 tablet 1   docusate sodium (COLACE) 100 MG capsule Take 100 mg by mouth 2 (two) times daily.     Lactobacillus (PROBIOTIC ACIDOPHILUS PO) Take 1 tablet by mouth daily.     losartan (COZAAR) 100 MG tablet TAKE ONE TABLET BY MOUTH DAILY 90 tablet 3   lubiprostone (AMITIZA) 24 MCG capsule Take 1 capsule (24 mcg total) by mouth 2 (two) times daily with a meal. 60 capsule 5   Multiple Vitamin (MULTI-VITAMIN DAILY PO) Take by mouth.     omeprazole (PRILOSEC) 40 MG capsule TAKE ONE CAPSULE BY MOUTH DAILY 90 capsule 3   PARoxetine (PAXIL-CR) 25 MG 24 hr tablet TAKE 1 TABLET BY MOUTH DAILY 30 tablet 2   spironolactone (ALDACTONE) 25 MG tablet TAKE ONE TABLET BY MOUTH DAILY 90 tablet 3   vitamin B-12 (CYANOCOBALAMIN) 1000 MCG tablet Take 1,000 mcg by mouth daily.     zolpidem (AMBIEN CR) 12.5 MG CR tablet TAKE ONE TABLET BY MOUTH EVERY NIGHT AT BEDTIME AS NEEDED  FOR SLEEP 30 tablet 2   predniSONE (DELTASONE) 10 MG tablet 6 tablets on Day 1 , then reduce by 1 tablet daily until gone (Patient not taking: Reported on 05/12/2022) 21 tablet 0   No facility-administered medications prior to visit.    Review of Systems;  Patient denies headache, fevers, malaise, unintentional weight loss, skin rash, eye pain, sinus congestion and sinus pain, sore throat, dysphagia,  hemoptysis , cough, dyspnea, wheezing, chest pain, palpitations, orthopnea, edema, abdominal pain, nausea, melena, diarrhea, constipation, flank pain, dysuria, hematuria, urinary  Frequency, nocturia, numbness, tingling, seizures,  Focal weakness, Loss of consciousness,  Tremor, insomnia, depression, anxiety, and suicidal ideation.      Objective:  BP 102/68   Pulse (!) 114   Temp 99.7 F (37.6 C) (Oral)   Ht '5\' 9"'$  (1.753 m)   Wt 190 lb 9.6 oz (86.5 kg)   LMP 05/01/2014 (Approximate)   SpO2 95%   BMI 28.15 kg/m   BP Readings from Last 3 Encounters:  05/12/22 102/68  04/21/22 128/80  04/11/22 132/78    Wt Readings from Last 3 Encounters:  05/12/22 190 lb 9.6 oz (86.5 kg)  04/21/22 189 lb 3.2 oz (85.8 kg)  04/11/22 187 lb  12.8 oz (85.2 kg)    General appearance: alert, cooperative and appears stated age Ears: normal TM's and external ear canals both ears Throat: lips, mucosa, and tongue normal; teeth and gums normal Neck: no adenopathy, no carotid bruit, supple, symmetrical, trachea midline and thyroid not enlarged, symmetric, no tenderness/mass/nodules Back: symmetric, no curvature. ROM normal. No CVA tenderness. Lungs: clear to auscultation bilaterally Heart: regular rate and rhythm, S1, S2 normal, no murmur, click, rub or gallop Abdomen: soft, non-tender; bowel sounds normal; no masses,  no organomegaly Pulses: 2+ and symmetric Skin: Skin color, texture, turgor normal. No rashes or lesions Lymph nodes: Cervical, supraclavicular, and axillary nodes normal. Neuro:  awake and  interactive with normal mood and affect. Higher cortical functions are normal. Speech is clear without word-finding difficulty or dysarthria. Extraocular movements are intact. Visual fields of both eyes are grossly intact. Sensation to light touch is grossly intact bilaterally of upper and lower extremities. Motor examination shows 4+/5 symmetric hand grip and upper extremity and 5/5 lower extremity strength. There is no pronation or drift. Gait is non-ataxic   Lab Results  Component Value Date   HGBA1C 5.9 09/30/2021   HGBA1C 5.9 04/01/2021   HGBA1C 5.9 12/01/2020    Lab Results  Component Value Date   CREATININE 0.79 09/30/2021   CREATININE 0.83 04/01/2021   CREATININE 0.87 12/01/2020    Lab Results  Component Value Date   WBC 11.9 (H) 08/05/2018   HGB 14.3 08/05/2018   HCT 43.9 08/05/2018   PLT 399 08/05/2018   GLUCOSE 104 (H) 09/30/2021   CHOL 239 (H) 09/30/2021   TRIG 115.0 09/30/2021   HDL 59.60 09/30/2021   LDLDIRECT 155.0 01/22/2017   LDLCALC 156 (H) 09/30/2021   ALT 18 09/30/2021   AST 18 09/30/2021   NA 137 09/30/2021   K 4.0 09/30/2021   CL 102 09/30/2021   CREATININE 0.79 09/30/2021   BUN 12 09/30/2021   CO2 28 09/30/2021   TSH 1.00 09/30/2021   HGBA1C 5.9 09/30/2021   MICROALBUR <0.7 04/01/2021    MM 3D SCREEN BREAST BILATERAL  Result Date: 05/01/2022 CLINICAL DATA:  Screening. EXAM: DIGITAL SCREENING BILATERAL MAMMOGRAM WITH TOMOSYNTHESIS AND CAD TECHNIQUE: Bilateral screening digital craniocaudal and mediolateral oblique mammograms were obtained. Bilateral screening digital breast tomosynthesis was performed. The images were evaluated with computer-aided detection. COMPARISON:  Previous exam(s). ACR Breast Density Category b: There are scattered areas of fibroglandular density. FINDINGS: In the right breast, a possible asymmetry warrants further evaluation. In the left breast, no findings suspicious for malignancy. IMPRESSION: Further evaluation is suggested  for possible asymmetry in the right breast. RECOMMENDATION: Diagnostic mammogram and possibly ultrasound of the right breast. (Code:[id number]) The patient will be contacted regarding the findings, and additional imaging will be scheduled. BI-RADS CATEGORY  0: Incomplete. Need additional imaging evaluation and/or prior mammograms for comparison. Electronically Signed   By: Fidela Salisbury M.D.   On: 05/01/2022 16:45    Assessment & Plan:   Problem List Items Addressed This Visit   None   I spent a total of   minutes with this patient in a face to face visit on the date of this encounter reviewing the last office visit with me in       ,  most recent visit with cardiology ,    ,  patient's diet and exercise habits, home blood pressure /blod sugar readings, recent ER visit including labs and imaging studies ,   and post visit ordering of testing and  therapeutics.    Follow-up: No follow-ups on file.   Crecencio Mc, MD

## 2022-05-14 DIAGNOSIS — J209 Acute bronchitis, unspecified: Secondary | ICD-10-CM | POA: Insufficient documentation

## 2022-05-14 NOTE — Assessment & Plan Note (Signed)
COVID AND FLU POC are negative.  Supportive care including prednisone taper.  Antibiotic given but advised to postpone initiation ;  start for ear pain /sinus pain/fevers

## 2022-05-15 ENCOUNTER — Telehealth: Payer: Self-pay

## 2022-05-15 DIAGNOSIS — J209 Acute bronchitis, unspecified: Secondary | ICD-10-CM

## 2022-05-15 NOTE — Telephone Encounter (Signed)
Pt is aware and stated that she is feeling better today and doesn't need to be tested at this time.

## 2022-05-15 NOTE — Telephone Encounter (Signed)
We didn't do an RSV panel on her. I just swabbed her for the Flu and covid.

## 2022-05-15 NOTE — Telephone Encounter (Signed)
Patient states she is following-up on her RSV test.

## 2022-05-16 ENCOUNTER — Ambulatory Visit
Admission: RE | Admit: 2022-05-16 | Discharge: 2022-05-16 | Disposition: A | Discharge status: Home / Self Care | Payer: 59 | Source: Ambulatory Visit | Attending: Internal Medicine | Admitting: Internal Medicine

## 2022-05-16 DIAGNOSIS — R928 Other abnormal and inconclusive findings on diagnostic imaging of breast: Secondary | ICD-10-CM | POA: Diagnosis not present

## 2022-05-16 DIAGNOSIS — N6489 Other specified disorders of breast: Secondary | ICD-10-CM | POA: Diagnosis not present

## 2022-05-17 NOTE — Telephone Encounter (Signed)
Error

## 2022-05-30 ENCOUNTER — Other Ambulatory Visit: Payer: Self-pay | Admitting: Internal Medicine

## 2022-06-08 ENCOUNTER — Encounter: Payer: Self-pay | Admitting: Internal Medicine

## 2022-06-08 DIAGNOSIS — D229 Melanocytic nevi, unspecified: Secondary | ICD-10-CM

## 2022-07-01 ENCOUNTER — Other Ambulatory Visit: Payer: Self-pay | Admitting: Internal Medicine

## 2022-07-04 ENCOUNTER — Telehealth: Payer: Self-pay

## 2022-07-04 ENCOUNTER — Other Ambulatory Visit: Payer: Self-pay | Admitting: Internal Medicine

## 2022-07-04 MED ORDER — ALPRAZOLAM 0.5 MG PO TABS: ORAL_TABLET | ORAL | 2 refills | Status: DC

## 2022-07-04 NOTE — Telephone Encounter (Signed)
Requesting: Alprazolam Contract: No UDS: NO Last Visit: 12/26/2021 Next Visit: not scheduled Last Refill: 05/30/2022  Please Advise

## 2022-07-06 ENCOUNTER — Encounter: Payer: Self-pay | Admitting: Nurse Practitioner

## 2022-07-06 ENCOUNTER — Ambulatory Visit: Payer: 59 | Admitting: Nurse Practitioner

## 2022-07-06 VITALS — BP 130/78 | HR 103 | Temp 98.5°F | Ht 69.0 in | Wt 188.7 lb

## 2022-07-06 DIAGNOSIS — K5904 Chronic idiopathic constipation: Secondary | ICD-10-CM

## 2022-07-06 DIAGNOSIS — K5900 Constipation, unspecified: Secondary | ICD-10-CM

## 2022-07-06 NOTE — Progress Notes (Signed)
Established Patient Office Visit  Subjective:  Patient ID: Dawn Griffith, female    DOB: Oct 10, 1965  Age: 57 y.o. MRN: [id number]  CC:  Chief Complaint  Patient presents with   Constipation     HPI  Dawn Griffith presents for constipation. Her last proper BM was last week. She is has some BM today but feels bloated. Denies abdominal pain, vomiting, diarrhea.   Past Medical History:  Diagnosis Date   Anxiety 12/30/2010   Cancer [facility])    as a child   Chronic constipation    Family history of FAP (familial adenomatous polyposis) 07/07/2014   5 yr interval screening Last colonoscopy 2018   Fibroid uterus 07/07/2014   Noted during pelvic ultrasound in 2014 for fullness on exam    Goiter diffuse 03/05/2014   Repeat ultrasound of thyroid is unchanged.     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    Prediabetes 01/02/2016   Vitamin D deficiency 07/04/2016    Past Surgical History:  Procedure Laterality Date   CESAREAN SECTION  1992   CHOLECYSTECTOMY  Dec 2012   S[facility]ar   COLONOSCOPY     ECTOPIC PREGNANCY SURGERY  1997   LYMPH NODE BIOPSY     NASAL SINUS SURGERY      Family History  Problem Relation Age of Onset   Arrhythmia Mother    Hypertension Mother    Colon polyps Mother        s/p colectomy    Kidney disease Mother        CKD Stage 4   Uterine cancer Mother    Diabetes Mother    Hypertension Father    Diabetes Father    Hypertension Brother    Breast cancer Neg Hx     Social History   Socioeconomic History   Marital status: Married    Spouse name: [name]   Number of children: 1   Years of education: 14   Highest education level: Not on file  Occupational History   Occupation: OFFICE Armed forces operational officer: [employer]  Tobacco Use   Smoking status: Never    Smokeless tobacco: Never  Vaping Use   Vaping Use: Never used  Substance and Sexual Activity   Alcohol use: No    Alcohol/week: 0.0 standard drinks of alcohol   Drug use: No   Sexual activity: Yes    Birth control/protection: Post-menopausal  Other Topics Concern   Not on file  Social History Narrative   Not on file   Social Determinants of Health   Financial Resource Strain: Not on file  Food Insecurity: Not on file  Transportation Needs: Not on file  Physical Activity: Not on file  Stress: Not on file  Social Connections: Not on file  Intimate Partner Violence: Not on file     Outpatient Medications Prior to Visit  Medication Sig Dispense Refill   ALPRAZolam (XANAX) 0.5 MG tablet TAKE 1 TABLET BY MOUTH TWICE A DAY AS NEEDED FOR SLEEP OR ANXIETY 60 tablet 2   Ascorbic Acid (VITAMIN C) 1000 MG tablet Take 1,000 mg by mouth daily.     cetirizine (ZYRTEC) 10 MG tablet Take 10 mg by mouth daily.     cholecalciferol (VITAMIN D) 1000 units  tablet Take 1,000 Units by mouth daily.     cyclobenzaprine (FLEXERIL) 5 MG tablet Take 1 tablet (5 mg total) by mouth 3 (three) times daily as needed for muscle spasms. 30 tablet 1   docusate sodium (COLACE) 100 MG capsule Take 100 mg by mouth 2 (two) times daily.     Lactobacillus (PROBIOTIC ACIDOPHILUS PO) Take 1 tablet by mouth daily.     losartan (COZAAR) 100 MG tablet TAKE ONE TABLET BY MOUTH DAILY 90 tablet 3   lubiprostone (AMITIZA) 24 MCG capsule Take 1 capsule (24 mcg total) by mouth 2 (two) times daily with a meal. 60 capsule 5   Multiple Vitamin (MULTI-VITAMIN DAILY PO) Take by mouth.     omeprazole (PRILOSEC) 40 MG capsule TAKE ONE CAPSULE BY MOUTH DAILY 90 capsule 3   PARoxetine (PAXIL-CR) 25 MG 24 hr tablet TAKE 1 TABLET BY MOUTH DAILY 30 tablet 2   spironolactone (ALDACTONE) 25 MG tablet TAKE ONE TABLET BY MOUTH DAILY 90 tablet 3   vitamin B-12 (CYANOCOBALAMIN) 1000 MCG tablet Take 1,000 mcg by mouth daily.     zolpidem (AMBIEN  CR) 12.5 MG CR tablet TAKE ONE TABLET BY MOUTH EVERY NIGHT AT BEDTIME AS NEEDED FOR SLEEP 30 tablet 2   amitriptyline (ELAVIL) 100 MG tablet Take 1 tablet (100 mg total) by mouth at bedtime. 90 tablet 1   amoxicillin-clavulanate (AUGMENTIN) 875-125 MG tablet Take 1 tablet by mouth 2 (two) times daily. (Patient not taking: Reported on 07/06/2022) 14 tablet 0   chlorpheniramine-HYDROcodone (TUSSIONEX) 10-8 MG/5ML Take 5 mLs by mouth every 12 (twelve) hours as needed for cough. (Patient not taking: Reported on 07/06/2022) 180 mL 0   predniSONE (DELTASONE) 10 MG tablet 6 tablets on Day 1 , then reduce by 1 tablet daily until gone (Patient not taking: Reported on 07/06/2022) 21 tablet 0   No facility-administered medications prior to visit.    No Known Allergies  ROS Review of Systems  Constitutional: Negative.   Respiratory: Negative.    Cardiovascular: Negative.   Gastrointestinal:  Positive for constipation.       Bloating  Neurological: Negative.   Psychiatric/Behavioral: Negative.        Objective:    Physical Exam Constitutional:      Appearance: Normal appearance. She is normal weight.  Cardiovascular:     Rate and Rhythm: Normal rate and regular rhythm.     Pulses: Normal pulses.     Heart sounds: Normal heart sounds.  Abdominal:     General: Abdomen is flat. Bowel sounds are normal.     Palpations: Abdomen is soft. There is no mass.     Tenderness: There is no abdominal tenderness. There is no guarding.  Neurological:     General: No focal deficit present.     Mental Status: She is alert and oriented to person, place, and time. Mental status is at baseline.  Psychiatric:        Mood and Affect: Mood normal.        Behavior: Behavior normal.        Thought Content: Thought content normal.        Judgment: Judgment normal.     BP 130/78   Pulse (!) 103   Temp 98.5 F (36.9 C) (Oral)   Ht '5\' 9"'$  (1.753 m)   Wt 188 lb 11.2 oz (85.6 kg)   LMP 05/01/2014 (Approximate)    SpO2 98%   BMI 27.87 kg/m  Wt Readings from Last 3 Encounters:  07/06/22 188 lb 11.2 oz (85.6 kg)  05/12/22 190 lb 9.6 oz (86.5 kg)  04/21/22 189 lb 3.2 oz (85.8 kg)     Health Maintenance  Topic Date Due   CT Virtual Colonoscopy  Never done   COVID-19 Vaccine (4 - 2023-24 season) 02/03/2022   Diabetic kidney evaluation - Urine ACR  04/01/2022   HEMOGLOBIN A1C  04/01/2022   Diabetic kidney evaluation - eGFR measurement  10/01/2022   MAMMOGRAM  05/02/2023   PAP SMEAR-Modifier  10/08/2023   COLONOSCOPY (Pts 45-3yr Insurance coverage will need to be confirmed)  12/05/2028   DTaP/Tdap/Td (3 - Td or Tdap) 03/31/2032   INFLUENZA VACCINE  Completed   Hepatitis C Screening  Completed   HIV Screening  Completed   Zoster Vaccines- Shingrix  Completed   HPV VACCINES  Aged Out    There are no preventive care reminders to display for this patient.  Lab Results  Component Value Date   TSH 1.00 09/30/2021   Lab Results  Component Value Date   WBC 11.9 (H) 08/05/2018   HGB 14.3 08/05/2018   HCT 43.9 08/05/2018   MCV 91.8 08/05/2018   PLT 399 08/05/2018   Lab Results  Component Value Date   NA 137 09/30/2021   K 4.0 09/30/2021   CO2 28 09/30/2021   GLUCOSE 104 (H) 09/30/2021   BUN 12 09/30/2021   CREATININE 0.79 09/30/2021   BILITOT 0.5 09/30/2021   ALKPHOS 76 09/30/2021   AST 18 09/30/2021   ALT 18 09/30/2021   PROT 7.1 09/30/2021   ALBUMIN 4.7 09/30/2021   CALCIUM 9.7 09/30/2021   ANIONGAP 10 08/05/2018   GFR 82.78 09/30/2021   Lab Results  Component Value Date   CHOL 239 (H) 09/30/2021   Lab Results  Component Value Date   HDL 59.60 09/30/2021   Lab Results  Component Value Date   LDLCALC 156 (H) 09/30/2021   Lab Results  Component Value Date   TRIG 115.0 09/30/2021   Lab Results  Component Value Date   CHOLHDL 4 09/30/2021   Lab Results  Component Value Date   HGBA1C 5.9 09/30/2021      Assessment & Plan:   Problem List Items Addressed This  Visit       Other   Constipation - Primary    Normoactive bowel sounds present in all 4 quadrants. Continue docusate and Amitiza daily. Encouraged her to increase fiber intake and hydration.         No orders of the defined types were placed in this encounter.    Follow-up: Return if symptoms worsen or fail to improve.    CTheresia Lo NP

## 2022-07-11 ENCOUNTER — Other Ambulatory Visit: Payer: Self-pay | Admitting: Internal Medicine

## 2022-07-11 NOTE — Assessment & Plan Note (Signed)
Normoactive bowel sounds present in all 4 quadrants. Continue docusate and Amitiza daily. Encouraged her to increase fiber intake and hydration.

## 2022-08-04 ENCOUNTER — Other Ambulatory Visit: Payer: Self-pay | Admitting: Internal Medicine

## 2022-08-08 ENCOUNTER — Other Ambulatory Visit: Payer: Self-pay | Admitting: Internal Medicine

## 2022-09-28 ENCOUNTER — Other Ambulatory Visit: Payer: Self-pay | Admitting: Internal Medicine

## 2022-10-05 ENCOUNTER — Other Ambulatory Visit: Payer: Self-pay | Admitting: Internal Medicine

## 2022-10-09 ENCOUNTER — Other Ambulatory Visit: Payer: Self-pay | Admitting: Internal Medicine

## 2022-10-09 NOTE — Telephone Encounter (Signed)
Refilled: 07/04/2022 Last OV: 07/06/2022 Next OV: not scheduled

## 2022-10-10 NOTE — Telephone Encounter (Signed)
My Chart message sent

## 2022-11-12 ENCOUNTER — Other Ambulatory Visit: Payer: Self-pay | Admitting: Internal Medicine

## 2022-12-22 ENCOUNTER — Telehealth: Payer: 59 | Admitting: Nurse Practitioner

## 2022-12-22 ENCOUNTER — Encounter: Payer: Self-pay | Admitting: Nurse Practitioner

## 2022-12-22 VITALS — Ht 69.0 in | Wt 188.0 lb

## 2022-12-22 DIAGNOSIS — U071 COVID-19: Secondary | ICD-10-CM

## 2022-12-22 HISTORY — DX: COVID-19: U07.1

## 2022-12-22 MED ORDER — HYDROCOD POLI-CHLORPHE POLI ER 10-8 MG/5ML PO SUER: 5.0000 mL | Freq: Two times a day (BID) | ORAL | 0 refills | Status: DC | PRN

## 2022-12-22 NOTE — Assessment & Plan Note (Signed)
COVID test positive at home. Patient not interested in antiviral treatment. She is agreeable to over the counter symptomatic treatment. Advised Mucinex to help with congestion. She can take Tylenol or Ibuprofen as needed. Will send in Tussionex to help with cough. Encouraged adequate fluid intake. Counseled on quarantine protocol. Strict precautions given to patient.

## 2022-12-22 NOTE — Progress Notes (Signed)
MyChart Video Visit    Virtual Visit via Video Note   This visit type was conducted because this format is felt to be most appropriate for this patient at this time. Physical exam was limited by quality of the video and audio technology used for the visit. CMA was able to get the patient set up on a video visit.  Patient location: Home. Patient and provider in visit Provider location: Office  I discussed the limitations of evaluation and management by telemedicine and the availability of in person appointments. The patient expressed understanding and agreed to proceed.  Visit Date: 12/22/2022  Today's healthcare provider: Bethanie Dicker, NP     Subjective:    Patient ID: Dawn Griffith, female    DOB: 1965/06/10, 57 y.o.   MRN: [id number]  Chief Complaint  Patient presents with   Covid Positive    A faint positive , husband tested positive Monday:  Thursday had congestion No chest pain or SOB     HPI Patient with symptoms that began 2 days ago. She has multiple members in her family that have tested positive for COVID. She had a positive test this morning.  Respiratory illness:  Cough- Yes  Congestion-    Sinus- Yes   Chest- No  Post nasal drip- Yes  Sore throat- Irritation  Shortness of breath- No  Fever- No  Fatigue/Myalgia- Yes Headache- Yes Nausea/Vomiting- No Taste disturbance- No  Smell disturbance- No  Covid exposure- Yes  Covid vaccination- x 3  Flu vaccination- UTD  Medications- Tylenol/Ibuprofen   Past Medical History:  Diagnosis Date   Anxiety 12/30/2010   Cancer [facility])    as a child   Chronic constipation    Family history of FAP (familial adenomatous polyposis) 07/07/2014   5 yr interval screening Last colonoscopy 2018   Fibroid uterus 07/07/2014   Noted during pelvic ultrasound in 2014 for fullness on exam    Goiter diffuse 03/05/2014   Repeat ultrasound of thyroid is unchanged.     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    Prediabetes 01/02/2016   Vitamin D deficiency 07/04/2016    Past Surgical History:  Procedure Laterality Date   CESAREAN SECTION  1992   CHOLECYSTECTOMY  Dec 2012   Sankar   COLONOSCOPY     ECTOPIC PREGNANCY SURGERY  1997   LYMPH NODE BIOPSY     NASAL SINUS SURGERY      Family History  Problem Relation Age of Onset   Arrhythmia Mother    Hypertension Mother    Colon polyps Mother        s/p colectomy    Kidney disease Mother        CKD Stage 4   Uterine cancer Mother    Diabetes Mother    Hypertension Father    Diabetes Father    Hypertension Brother    Breast cancer Neg Hx     Social History   Socioeconomic History   Marital status: Married    Spouse name: Lyda Perone   Number of children: 1   Years of education: 14   Highest education level: Not on file  Occupational History   Occupation: OFFICE Marine scientist: [employer]  Tobacco Use   Smoking  status: Never   Smokeless tobacco: Never  Vaping Use   Vaping status: Never Used  Substance and Sexual Activity   Alcohol use: No    Alcohol/week: 0.0 standard drinks of alcohol   Drug use: No   Sexual activity: Yes    Birth control/protection: Post-menopausal  Other Topics Concern   Not on file  Social History Narrative   Not on file   Social Determinants of Health   Financial Resource Strain: Not on file  Food Insecurity: Not on file  Transportation Needs: Not on file  Physical Activity: Not on file  Stress: Not on file  Social Connections: Not on file  Intimate Partner Violence: Not on file    Outpatient Medications Prior to Visit  Medication Sig Dispense Refill   Lactobacillus (PROBIOTIC ACIDOPHILUS PO) Take 1 tablet by mouth daily.     losartan (COZAAR) 100 MG tablet TAKE ONE TABLET BY MOUTH DAILY 90 tablet 1    lubiprostone (AMITIZA) 24 MCG capsule TAKE ONE CAPSULE BY MOUTH TWICE A DAY WITH MEALS 60 capsule 5   omeprazole (PRILOSEC) 40 MG capsule TAKE ONE CAPSULE BY MOUTH DAILY 90 capsule 3   PARoxetine (PAXIL-CR) 25 MG 24 hr tablet TAKE 1 TABLET BY MOUTH DAILY 90 tablet 1   spironolactone (ALDACTONE) 25 MG tablet TAKE ONE TABLET BY MOUTH DAILY 90 tablet 1   vitamin B-12 (CYANOCOBALAMIN) 1000 MCG tablet Take 1,000 mcg by mouth daily.     ALPRAZolam (XANAX) 0.5 MG tablet TAKE 1 TABLET BY MOUTH TWICE A DAY AS NEEDED FOR SLEEP AND ANXIETY 60 tablet 1   amitriptyline (ELAVIL) 100 MG tablet TAKE 1 TABLET BY MOUTH AT BEDTIME 90 tablet 1   Ascorbic Acid (VITAMIN C) 1000 MG tablet Take 1,000 mg by mouth daily.     cetirizine (ZYRTEC) 10 MG tablet Take 10 mg by mouth daily.     cholecalciferol (VITAMIN D) 1000 units tablet Take 1,000 Units by mouth daily.     cyclobenzaprine (FLEXERIL) 5 MG tablet Take 1 tablet (5 mg total) by mouth 3 (three) times daily as needed for muscle spasms. (Patient not taking: Reported on 12/22/2022) 30 tablet 1   docusate sodium (COLACE) 100 MG capsule Take 100 mg by mouth 2 (two) times daily.     Multiple Vitamin (MULTI-VITAMIN DAILY PO) Take by mouth.     zolpidem (AMBIEN CR) 12.5 MG CR tablet TAKE 1 TABLET BY MOUTH EVERY NIGHT AT BEDTIME AS NEEDED FOR SLEEP 30 tablet 1   amoxicillin-clavulanate (AUGMENTIN) 875-125 MG tablet Take 1 tablet by mouth 2 (two) times daily. 14 tablet 0   chlorpheniramine-HYDROcodone (TUSSIONEX) 10-8 MG/5ML Take 5 mLs by mouth every 12 (twelve) hours as needed for cough. (Patient not taking: Reported on 07/06/2022) 180 mL 0   predniSONE (DELTASONE) 10 MG tablet 6 tablets on Day 1 , then reduce by 1 tablet daily until gone 21 tablet 0   No facility-administered medications prior to visit.    No Known Allergies  ROS See HPI    Objective:    Physical Exam  Ht 5\' 9"  (1.753 m)   Wt 188 lb (85.3 kg)   LMP 05/01/2014 (Approximate)   BMI 27.76 kg/m  Wt  Readings from Last 3 Encounters:  12/22/22 188 lb (85.3 kg)  07/06/22 188 lb 11.2 oz (85.6 kg)  05/12/22 190 lb 9.6 oz (86.5 kg)   GENERAL: alert, oriented, appears well and in no acute distress   HEENT: atraumatic, conjunttiva clear, no obvious abnormalities on  inspection of external nose and ears   NECK: normal movements of the head and neck   LUNGS: on inspection no signs of respiratory distress, breathing rate appears normal, no obvious gross SOB, gasping or wheezing   CV: no obvious cyanosis   MS: moves all visible extremities without noticeable abnormality   PSYCH/NEURO: pleasant and cooperative, no obvious depression or anxiety, speech and thought processing grossly intact     Assessment & Plan:   Problem List Items Addressed This Visit       Other   Positive self-administered antigen test for COVID-19 - Primary    COVID test positive at home. Patient not interested in antiviral treatment. She is agreeable to over the counter symptomatic treatment. Advised Mucinex to help with congestion. She can take Tylenol or Ibuprofen as needed. Will send in Tussionex to help with cough. Encouraged adequate fluid intake. Counseled on quarantine protocol. Strict precautions given to patient.       Relevant Medications   chlorpheniramine-HYDROcodone (TUSSIONEX) 10-8 MG/5ML    I have discontinued Belladonna C. Geissinger's predniSONE and amoxicillin-clavulanate. I am also having her maintain her cetirizine, Lactobacillus (PROBIOTIC ACIDOPHILUS PO), docusate sodium, cholecalciferol, Multiple Vitamin (MULTI-VITAMIN DAILY PO), vitamin C, cyanocobalamin, omeprazole, cyclobenzaprine, amitriptyline, lubiprostone, PARoxetine, losartan, spironolactone, ALPRAZolam, zolpidem, and chlorpheniramine-HYDROcodone.  Meds ordered this encounter  Medications   chlorpheniramine-HYDROcodone (TUSSIONEX) 10-8 MG/5ML    Sig: Take 5 mLs by mouth every 12 (twelve) hours as needed for cough.    Dispense:  180 mL     Refill:  0    Order Specific Question:   Supervising Provider    Answer:   Birdie Sons, ERIC G [[id number]]    I discussed the assessment and treatment plan with the patient. The patient was provided an opportunity to ask questions and all were answered. The patient agreed with the plan and demonstrated an understanding of the instructions.   The patient was advised to call back or seek an in-person evaluation if the symptoms worsen or if the condition fails to improve as anticipated.   Bethanie Dicker, NP [facility] [phone] (phone) [phone] (fax)  [facility]

## 2023-01-10 ENCOUNTER — Ambulatory Visit: Payer: 59 | Admitting: Internal Medicine

## 2023-01-13 ENCOUNTER — Other Ambulatory Visit: Payer: Self-pay | Admitting: Internal Medicine

## 2023-02-02 ENCOUNTER — Other Ambulatory Visit: Payer: Self-pay | Admitting: Internal Medicine

## 2023-02-13 ENCOUNTER — Other Ambulatory Visit: Payer: Self-pay | Admitting: Internal Medicine

## 2023-03-12 ENCOUNTER — Encounter: Payer: Self-pay | Admitting: Internal Medicine

## 2023-03-12 ENCOUNTER — Ambulatory Visit: Payer: 59 | Admitting: Internal Medicine

## 2023-03-12 ENCOUNTER — Ambulatory Visit: Payer: 59

## 2023-03-12 VITALS — BP 120/88 | HR 92 | Temp 98.7°F | Ht 69.0 in | Wt 187.0 lb

## 2023-03-12 DIAGNOSIS — Z23 Encounter for immunization: Secondary | ICD-10-CM | POA: Diagnosis not present

## 2023-03-12 DIAGNOSIS — F411 Generalized anxiety disorder: Secondary | ICD-10-CM | POA: Diagnosis not present

## 2023-03-12 DIAGNOSIS — F5105 Insomnia due to other mental disorder: Secondary | ICD-10-CM | POA: Diagnosis not present

## 2023-03-12 DIAGNOSIS — G2581 Restless legs syndrome: Secondary | ICD-10-CM | POA: Diagnosis not present

## 2023-03-12 DIAGNOSIS — E663 Overweight: Secondary | ICD-10-CM

## 2023-03-12 DIAGNOSIS — R7303 Prediabetes: Secondary | ICD-10-CM | POA: Diagnosis not present

## 2023-03-12 DIAGNOSIS — Z1231 Encounter for screening mammogram for malignant neoplasm of breast: Secondary | ICD-10-CM

## 2023-03-12 DIAGNOSIS — F419 Anxiety disorder, unspecified: Secondary | ICD-10-CM

## 2023-03-12 DIAGNOSIS — E040 Nontoxic diffuse goiter: Secondary | ICD-10-CM

## 2023-03-12 DIAGNOSIS — E785 Hyperlipidemia, unspecified: Secondary | ICD-10-CM | POA: Diagnosis not present

## 2023-03-12 DIAGNOSIS — I10 Essential (primary) hypertension: Secondary | ICD-10-CM

## 2023-03-12 LAB — COMPREHENSIVE METABOLIC PANEL
ALT: 16 U/L (ref 0–35)
AST: 19 U/L (ref 0–37)
Albumin: 4.3 g/dL (ref 3.5–5.2)
Alkaline Phosphatase: 65 U/L (ref 39–117)
BUN: 13 mg/dL (ref 6–23)
CO2: 27 meq/L (ref 19–32)
Calcium: 9.6 mg/dL (ref 8.4–10.5)
Chloride: 104 meq/L (ref 96–112)
Creatinine, Ser: 0.8 mg/dL (ref 0.40–1.20)
GFR: 80.72 mL/min (ref >60.00)
Glucose, Bld: 101 mg/dL — ABNORMAL HIGH (ref 70–99)
Potassium: 4.5 meq/L (ref 3.5–5.1)
Sodium: 139 meq/L (ref 135–145)
Total Bilirubin: 0.4 mg/dL (ref 0.2–1.2)
Total Protein: 6.9 g/dL (ref 6.0–8.3)

## 2023-03-12 LAB — LIPID PANEL
Cholesterol: 227 mg/dL — ABNORMAL HIGH (ref 0–200)
HDL: 57.9 mg/dL (ref >39.00)
LDL Cholesterol: 145 mg/dL — ABNORMAL HIGH (ref 0–99)
NonHDL: 169.14
Total CHOL/HDL Ratio: 4
Triglycerides: 120 mg/dL (ref 0.0–149.0)
VLDL: 24 mg/dL (ref 0.0–40.0)

## 2023-03-12 LAB — CBC WITH DIFFERENTIAL/PLATELET
Basophils Absolute: 0.1 10*3/uL (ref 0.0–0.1)
Basophils Relative: 1.1 % (ref 0.0–3.0)
Eosinophils Absolute: 0.1 10*3/uL (ref 0.0–0.7)
Eosinophils Relative: 2.7 % (ref 0.0–5.0)
HCT: 41 % (ref 36.0–46.0)
Hemoglobin: 13.3 g/dL (ref 12.0–15.0)
Lymphocytes Relative: 29.7 % (ref 12.0–46.0)
Lymphs Abs: 1.4 10*3/uL (ref 0.7–4.0)
MCHC: 32.5 g/dL (ref 30.0–36.0)
MCV: 91.7 fL (ref 78.0–100.0)
Monocytes Absolute: 0.3 10*3/uL (ref 0.1–1.0)
Monocytes Relative: 5.6 % (ref 3.0–12.0)
Neutro Abs: 2.9 10*3/uL (ref 1.4–7.7)
Neutrophils Relative %: 60.9 % (ref 43.0–77.0)
Platelets: 294 10*3/uL (ref 150.0–400.0)
RBC: 4.47 Mil/uL (ref 3.87–5.11)
RDW: 13.4 % (ref 11.5–15.5)
WBC: 4.8 10*3/uL (ref 4.0–10.5)

## 2023-03-12 LAB — LDL CHOLESTEROL, DIRECT: Direct LDL: 157 mg/dL

## 2023-03-12 LAB — MICROALBUMIN / CREATININE URINE RATIO
Creatinine,U: 83.5 mg/dL
Microalb Creat Ratio: 1.2 mg/g (ref 0.0–30.0)
Microalb, Ur: 1 mg/dL (ref 0.0–1.9)

## 2023-03-12 LAB — TSH: TSH: 0.81 u[IU]/mL (ref 0.35–5.50)

## 2023-03-12 LAB — HEMOGLOBIN A1C: Hgb A1c MFr Bld: 5.9 % (ref 4.6–6.5)

## 2023-03-12 MED ORDER — PAROXETINE HCL ER 25 MG PO TB24: 25.0000 mg | ORAL_TABLET | Freq: Every day | ORAL | 1 refills | Status: DC

## 2023-03-12 NOTE — Assessment & Plan Note (Signed)
CHECKING TSH TODAY

## 2023-03-12 NOTE — Progress Notes (Signed)
Subjective:  Patient ID: Dawn Griffith, female    DOB: 12-01-1965  Age: 57 y.o. MRN: [id number]  CC: The primary encounter diagnosis was Essential hypertension, benign. Diagnoses of Overweight (BMI 25.0-29.9), Hyperlipidemia LDL goal <130, Prediabetes, Encounter for screening mammogram for malignant neoplasm of breast, Restless legs syndrome, Goiter diffuse, Insomnia secondary to anxiety, Generalized anxiety disorder, and Need for influenza vaccination were also pertinent to this visit.   HPI Aideliz C Croft presents for  Chief Complaint  Patient presents with   Medical Management of Chronic Issues   1) .Hypertension: patient  does not checK blood pressure at home.  Has not taken her losartan in 48 hours.  . Patient is following a reduced salt diet most days and is taking medications as prescribed (except) for current missed doses     2) Insomnia/anxiety:  trying to sleep without alprazolam.  Using ambien and elavil and not sleeping. Well    Takes meds at 8 pm . In bed by 10 pm .  Has trouble initiating and maintaining sleep.  Reviewed principles of good sleep hygiene.Marland Kitchen  attributes her issues with sleeping to anxiety due to health issues of husband and both sets of parents. Restless legs worse since stopping alprazolam .  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  Last ultrasound 2020  no follow up advised given stability .  No symptoms of overactive or underactive thyroid.    Outpatient Medications Prior to Visit  Medication Sig Dispense Refill   ALPRAZolam (XANAX) 0.5 MG tablet TAKE 1 TABLET BY MOUTH TWICE A DAY AS NEEDED FOR SLEEP AND ANXIETY 60 tablet 1   amitriptyline (ELAVIL) 100 MG tablet TAKE 1 TABLET BY MOUTH AT BEDTIME 90 tablet 1   Ascorbic Acid (VITAMIN C) 1000 MG tablet  Take 1,000 mg by mouth daily.     cetirizine (ZYRTEC) 10 MG tablet Take 10 mg by mouth daily.     cholecalciferol (VITAMIN D) 1000 units tablet Take 1,000 Units by mouth daily.     docusate sodium (COLACE) 100 MG capsule Take 100 mg by mouth 2 (two) times daily.     Lactobacillus (PROBIOTIC ACIDOPHILUS PO) Take 1 tablet by mouth daily.     losartan (COZAAR) 100 MG tablet TAKE ONE TABLET BY MOUTH DAILY 90 tablet 1   lubiprostone (AMITIZA) 24 MCG capsule TAKE ONE CAPSULE BY MOUTH TWICE A DAY WITH MEALS 60 capsule 5   Multiple Vitamin (MULTI-VITAMIN DAILY PO) Take by mouth.     omeprazole (PRILOSEC) 40 MG capsule TAKE 1 CAPSULE BY MOUTH DAILY 90 capsule 3   spironolactone (ALDACTONE) 25 MG tablet TAKE ONE TABLET BY MOUTH DAILY 90 tablet 1   vitamin B-12 (CYANOCOBALAMIN) 1000 MCG tablet Take 1,000 mcg by mouth daily.     zolpidem (AMBIEN CR) 12.5 MG CR tablet TAKE 1 TABLET BY MOUTH EVERY NIGHT AT BEDTIME AS NEEDED FOR SLEEP 30 tablet 5   PARoxetine (PAXIL-CR) 25 MG 24 hr tablet TAKE 1 TABLET BY MOUTH DAILY 90 tablet 1   cyclobenzaprine (FLEXERIL) 5 MG tablet Take 1 tablet (5 mg total) by mouth 3 (three) times daily as needed for muscle spasms. (Patient not taking: Reported on 12/22/2022) 30 tablet 1   chlorpheniramine-HYDROcodone (TUSSIONEX) 10-8 MG/5ML  Take 5 mLs by mouth every 12 (twelve) hours as needed for cough. (Patient not taking: Reported on 03/12/2023) 180 mL 0   No facility-administered medications prior to visit.    Review of Systems;  Patient denies headache, fevers, malaise, unintentional weight loss, skin rash, eye pain, sinus congestion and sinus pain, sore throat, dysphagia,  hemoptysis , cough, dyspnea, wheezing, chest pain, palpitations, orthopnea, edema, abdominal pain, nausea, melena, diarrhea, constipation, flank pain, dysuria, hematuria, urinary  Frequency, nocturia, numbness, tingling, seizures,  Focal weakness, Loss of consciousness,  Tremor, insomnia, depression, anxiety, and  suicidal ideation.      Objective:  BP 120/88   Pulse 92   Temp 98.7 F (37.1 C) (Oral)   Ht 5\' 9"  (1.753 m)   Wt 187 lb (84.8 kg)   LMP 05/01/2014 (Approximate)   SpO2 97%   BMI 27.62 kg/m   BP Readings from Last 3 Encounters:  03/12/23 120/88  07/06/22 130/78  05/12/22 102/68    Wt Readings from Last 3 Encounters:  03/12/23 187 lb (84.8 kg)  12/22/22 188 lb (85.3 kg)  07/06/22 188 lb 11.2 oz (85.6 kg)    Physical Exam Vitals reviewed.  Constitutional:      General: She is not in acute distress.    Appearance: Normal appearance. She is normal weight. She is not ill-appearing, toxic-appearing or diaphoretic.  HENT:     Head: Normocephalic.  Eyes:     General: No scleral icterus.       Right eye: No discharge.        Left eye: No discharge.     Conjunctiva/sclera: Conjunctivae normal.  Cardiovascular:     Rate and Rhythm: Normal rate and regular rhythm.     Heart sounds: Normal heart sounds.  Pulmonary:     Effort: Pulmonary effort is normal. No respiratory distress.     Breath sounds: Normal breath sounds.  Musculoskeletal:        General: Normal range of motion.  Skin:    General: Skin is warm and dry.  Neurological:     General: No focal deficit present.     Mental Status: She is alert and oriented to person, place, and time. Mental status is at baseline.  Psychiatric:        Mood and Affect: Mood normal.        Behavior: Behavior normal.        Thought Content: Thought content normal.        Judgment: Judgment normal.    Lab Results  Component Value Date   HGBA1C 5.9 03/12/2023   HGBA1C 5.9 09/30/2021   HGBA1C 5.9 04/01/2021    Lab Results  Component Value Date   CREATININE 0.80 03/12/2023   CREATININE 0.79 09/30/2021   CREATININE 0.83 04/01/2021    Lab Results  Component Value Date   WBC 4.8 03/12/2023   HGB 13.3 03/12/2023   HCT 41.0 03/12/2023   PLT 294.0 03/12/2023   GLUCOSE 101 (H) 03/12/2023   CHOL 227 (H) 03/12/2023   TRIG  120.0 03/12/2023   HDL 57.90 03/12/2023   LDLDIRECT 157.0 03/12/2023   LDLCALC 145 (H) 03/12/2023   ALT 16 03/12/2023   AST 19 03/12/2023   NA 139 03/12/2023   K 4.5 03/12/2023   CL 104 03/12/2023   CREATININE 0.80 03/12/2023   BUN 13 03/12/2023   CO2 27 03/12/2023   TSH 0.81 03/12/2023   HGBA1C 5.9 03/12/2023   MICROALBUR 1.0 03/12/2023    MM DIAG BREAST  TOMO UNI RIGHT  Result Date: 05/16/2022 CLINICAL DATA:  Callback for RIGHT breast asymmetry seen on CC view only EXAM: DIGITAL DIAGNOSTIC UNILATERAL RIGHT MAMMOGRAM WITH TOMOSYNTHESIS TECHNIQUE: Right digital diagnostic mammography and breast tomosynthesis was performed. COMPARISON:  Previous exam(s). ACR Breast Density Category b: There are scattered areas of fibroglandular density. FINDINGS: The previously described finding does not persist with additional views, consistent with superimposed fibroglandular tissue. No suspicious mass, microcalcification, or other finding is identified. IMPRESSION: No mammographic evidence of malignancy. RECOMMENDATION: Screening mammogram in one year.(Code:SM-B-01Y) I have discussed the findings and recommendations with the patient. If applicable, a reminder letter will be sent to the patient regarding the next appointment. BI-RADS CATEGORY  1: Negative. Electronically Signed   By: Meda Klinefelter M.D.   On: 05/16/2022 14:09    Assessment & Plan:  .Essential hypertension, benign -     Comprehensive metabolic panel -     Microalbumin / creatinine urine ratio  Overweight (BMI 25.0-29.9)  Hyperlipidemia LDL goal <130 -     Lipid panel -     LDL cholesterol, direct  Prediabetes -     Hemoglobin A1c  Encounter for screening mammogram for malignant neoplasm of breast -     3D Screening Mammogram, Left and Right; Future  Restless legs syndrome Assessment & Plan: Never treated , was controlled until alprazolam was stopped ,  apparently has been   present for 25 years.  No prior evaluation ,   iron studies are normal.  Will start requip   Lab Results  Component Value Date   IRON 109 03/12/2023   TIBC 385 03/12/2023   FERRITIN 32 03/12/2023     Orders: -     CBC with Differential/Platelet -     Iron, TIBC and Ferritin Panel  Goiter diffuse Assessment & Plan: CHECKING TSH TODAY    Orders: -     TSH  Insomnia secondary to anxiety Assessment & Plan: TAKING AMBIEN CR AND ELAVIL 100 MG DAILY .  NOT SLEEPING WELL DUE TO ANXIETY ABOUT HUSBAND AND PARENT'S HEALTH.  COUNSELLING GIVEN. About c control issues.   ENCOURAGED TO EXERCISE DAILY   ,   Generalized anxiety disorder Assessment & Plan: CURRENTLY AT BASELINE WITH RARE PANIC ATTACKS.  CONTINUE PAXIL CR .  AND PRN USE OF ALPRAZOLMA    Need for influenza vaccination -     Flu vaccine trivalent PF, 6mos and older(Flulaval,Afluria,Fluarix,Fluzone)  Other orders -     PARoxetine HCl ER; Take 1 tablet (25 mg total) by mouth daily.  Dispense: 90 tablet; Refill: 1 -     rOPINIRole HCl; Take 1 tablet (0.25 mg total) by mouth at bedtime.  Dispense: 90 tablet; Refill: 2     I provided 30 minutes of face-to-face time during this encounter reviewing patient's last visit with me, patient's  recent surgical and non surgical procedures, previous  labs and imaging studies, counseling on currently addressed issues,  and post visit ordering to diagnostics and therapeutic   Follow-up: No follow-ups on file.   Sherlene Shams, MD

## 2023-03-12 NOTE — Assessment & Plan Note (Signed)
CURRENTLY AT BASELINE WITH RARE PANIC ATTACKS.  CONTINUE PAXIL CR .  AND PRN USE OF ALPRAZOLMA

## 2023-03-12 NOTE — Assessment & Plan Note (Signed)
TAKING AMBIEN CR AND ELAVIL 100 MG DAILY .  NOT SLEEPING WELL DUE TO ANXIETY ABOUT HUSBAND AND PARENT'S HEALTH.  COUNSELLING GIVEN. About c control issues.   ENCOURAGED TO EXERCISE DAILY   ,

## 2023-03-12 NOTE — Patient Instructions (Addendum)
YOUR BLOOD PRESSURE IS SLIGHTLY HIGH TODAY  It should be around 130/80 or less.  Please check your blood pressure a few times at home and send me the readings so I can determine if you need to increase your MEDICATION.  ALWAYS take your scheduled blood pressure medications before you come to see me   I RECOMMEND WALKING EVERY DAY FOR HEALTH AND ANXIETY REASON    You might want to try using Relaxium for insomnia  (as seen on TV commercials) . It is available through [employer] and contains all natural supplements:  Melatonin 5 mg  Chamomile 25 mg Passionflower extract 75 mg GABA 100 mg Ashwaganda extract 125 mg Magnesium citrate, glycinate, oxide (100 mg)  L tryptophan 500 mg Valerest (proprietary  ingredient ; probably valeria root extract)

## 2023-03-12 NOTE — Assessment & Plan Note (Signed)
Never treated , was controlled until alprazolam was stopped ,  apparently has been   present for 25 years.  No prior evaluation ,  iron studies are normal.  Will start requip   Lab Results  Component Value Date   IRON 109 03/12/2023   TIBC 385 03/12/2023   FERRITIN 32 03/12/2023

## 2023-03-13 LAB — IRON,TIBC AND FERRITIN PANEL
%SAT: 28 % (ref 16–45)
Ferritin: 32 ng/mL (ref 16–232)
Iron: 109 ug/dL (ref 45–160)
TIBC: 385 ug/dL (ref 250–450)

## 2023-03-13 MED ORDER — ROPINIROLE HCL 0.25 MG PO TABS: 0.2500 mg | ORAL_TABLET | Freq: Every day | ORAL | 2 refills | Status: DC

## 2023-03-13 NOTE — Addendum Note (Signed)
Addended by: Sherlene Shams on: 03/13/2023 08:46 AM   Modules accepted: Orders

## 2023-04-02 NOTE — Progress Notes (Unsigned)
ANNUAL PREVENTATIVE CARE GYNECOLOGY  ENCOUNTER NOTE  Subjective:       Dawn Griffith is a 57 y.o. G33P0020 female here for a routine annual gynecologic exam. The patient {is/is not/has never been:13135} sexually active. The patient {is/is not:13135} taking hormone replacement therapy. {post-men bleed:13152::"Patient denies post-menopausal vaginal bleeding."} The patient wears seatbelts: yes. The patient participates in regular exercise: {yes/no/not asked:9010}. Has the patient ever been transfused or tattooed?: {yes/no/not asked:9010}. The patient reports that there is not domestic violence in her life.  Current complaints: 1.  ***    Gynecologic History Patient's last menstrual period was 05/01/2014 (approximate). Last Pap: 10/07/2020. Results were: normal Last mammogram: 05/16/2022. Results were: normal Last Colonoscopy: 10/26/2021 Last Dexa:N/A   Obstetric History OB History  Gravida Para Term Preterm AB Living  3 1     2     SAB IAB Ectopic Multiple Live Births  1   1        # Outcome Date GA Lbr Len/2nd Weight Sex Type Anes PTL Lv  3 SAB           2 Ectopic           1 Para             Past Medical History:  Diagnosis Date   Anxiety 12/30/2010   Cancer [facility])    as a child   Chronic constipation    Family history of FAP (familial adenomatous polyposis) 07/07/2014   5 yr interval screening Last colonoscopy 2018   Fibroid uterus 07/07/2014   Noted during pelvic ultrasound in 2014 for fullness on exam    Goiter diffuse 03/05/2014   Repeat ultrasound of thyroid is unchanged.     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    Prediabetes 01/02/2016   Vitamin D deficiency 07/04/2016    Family History  Problem Relation Age of Onset   Arrhythmia Mother    Hypertension Mother    Colon  polyps Mother        s/p colectomy    Kidney disease Mother        CKD Stage 4   Uterine cancer Mother    Diabetes Mother    Hypertension Father    Diabetes Father    Hypertension Brother    Breast cancer Neg Hx     Past Surgical History:  Procedure Laterality Date   CESAREAN SECTION  1992   CHOLECYSTECTOMY  Dec 2012   [facility]   COLONOSCOPY     ECTOPIC PREGNANCY SURGERY  1997   LYMPH NODE BIOPSY     NASAL SINUS SURGERY      Social History   Socioeconomic History   Marital status: Married    Spouse name: Lyda Perone   Number of children: 1   Years of education: 14   Highest education level: Not on file  Occupational History   Occupation: OFFICE Marine scientist: [employer]  Tobacco Use   Smoking status: Never   Smokeless tobacco: Never  Vaping Use   Vaping status: Never Used  Substance and Sexual Activity   Alcohol use: No    Alcohol/week: 0.0 standard drinks of alcohol   Drug use: No   Sexual activity: Yes    Birth control/protection: Post-menopausal  Other Topics Concern   Not on file  Social History Narrative   Not on file   Social Determinants of Health   Financial Resource Strain: Not on file  Food Insecurity: Not on file  Transportation Needs: Not on file  Physical Activity: Not on file  Stress: Not on file  Social Connections: Not on file  Intimate Partner Violence: Not on file    Current Outpatient Medications on File Prior to Visit  Medication Sig Dispense Refill   ALPRAZolam (XANAX) 0.5 MG tablet TAKE 1 TABLET BY MOUTH TWICE A DAY AS NEEDED FOR SLEEP AND ANXIETY 60 tablet 1   amitriptyline (ELAVIL) 100 MG tablet TAKE 1 TABLET BY MOUTH AT BEDTIME 90 tablet 1   Ascorbic Acid (VITAMIN C) 1000 MG tablet Take 1,000 mg by mouth daily.     cetirizine (ZYRTEC) 10 MG tablet Take 10 mg by mouth daily.     cholecalciferol (VITAMIN D) 1000 units tablet Take 1,000 Units by mouth daily.     cyclobenzaprine (FLEXERIL) 5 MG tablet Take 1 tablet (5 mg  total) by mouth 3 (three) times daily as needed for muscle spasms. (Patient not taking: Reported on 12/22/2022) 30 tablet 1   docusate sodium (COLACE) 100 MG capsule Take 100 mg by mouth 2 (two) times daily.     Lactobacillus (PROBIOTIC ACIDOPHILUS PO) Take 1 tablet by mouth daily.     losartan (COZAAR) 100 MG tablet TAKE ONE TABLET BY MOUTH DAILY 90 tablet 1   lubiprostone (AMITIZA) 24 MCG capsule TAKE ONE CAPSULE BY MOUTH TWICE A DAY WITH MEALS 60 capsule 5   Multiple Vitamin (MULTI-VITAMIN DAILY PO) Take by mouth.     omeprazole (PRILOSEC) 40 MG capsule TAKE 1 CAPSULE BY MOUTH DAILY 90 capsule 3   PARoxetine (PAXIL-CR) 25 MG 24 hr tablet Take 1 tablet (25 mg total) by mouth daily. 90 tablet 1   rOPINIRole (REQUIP) 0.25 MG tablet Take 1 tablet (0.25 mg total) by mouth at bedtime. 90 tablet 2   spironolactone (ALDACTONE) 25 MG tablet TAKE ONE TABLET BY MOUTH DAILY 90 tablet 1   vitamin B-12 (CYANOCOBALAMIN) 1000 MCG tablet Take 1,000 mcg by mouth daily.     zolpidem (AMBIEN CR) 12.5 MG CR tablet TAKE 1 TABLET BY MOUTH EVERY NIGHT AT BEDTIME AS NEEDED FOR SLEEP 30 tablet 5   No current facility-administered medications on file prior to visit.    No Known Allergies    Review of Systems ROS Review of Systems - General ROS: negative for - chills, fatigue, fever, hot flashes, night sweats, weight gain or weight loss Psychological ROS: negative for - anxiety, decreased libido, depression, mood swings, physical abuse or sexual abuse Ophthalmic ROS: negative for - blurry vision, eye pain or loss of vision ENT ROS: negative for - headaches, hearing change, visual changes or vocal changes Allergy and Immunology ROS: negative for - hives, itchy/watery eyes or seasonal allergies Hematological and Lymphatic ROS: negative for - bleeding problems, bruising, swollen lymph nodes or weight loss Endocrine ROS: negative for - galactorrhea, hair pattern changes, hot flashes, malaise/lethargy, mood swings,  palpitations, polydipsia/polyuria, skin changes, temperature intolerance or unexpected weight changes Breast ROS: negative for - new or changing breast lumps or nipple discharge Respiratory ROS: negative for - cough or shortness of breath Cardiovascular ROS: negative for - chest pain, irregular heartbeat, palpitations or shortness of breath Gastrointestinal ROS: no abdominal pain, change in bowel habits, or black or bloody stools Genito-Urinary ROS: no dysuria, trouble voiding, or  hematuria Musculoskeletal ROS: negative for - joint pain or joint stiffness Neurological ROS: negative for - bowel and bladder control changes Dermatological ROS: negative for rash and skin lesion changes   Objective:   LMP 05/01/2014 (Approximate)  CONSTITUTIONAL: Well-developed, well-nourished female in no acute distress.  PSYCHIATRIC: Normal mood and affect. Normal behavior. Normal judgment and thought content. NEUROLGIC: Alert and oriented to person, place, and time. Normal muscle tone coordination. No cranial nerve deficit noted. HENT:  Normocephalic, atraumatic, External right and left ear normal. Oropharynx is clear and moist EYES: Conjunctivae and EOM are normal. Pupils are equal, round, and reactive to light. No scleral icterus.  NECK: Normal range of motion, supple, no masses.  Normal thyroid.  SKIN: Skin is warm and dry. No rash noted. Not diaphoretic. No erythema. No pallor. CARDIOVASCULAR: Normal heart rate noted, regular rhythm, no murmur. RESPIRATORY: Clear to auscultation bilaterally. Effort and breath sounds normal, no problems with respiration noted. BREASTS: Symmetric in size. No masses, skin changes, nipple drainage, or lymphadenopathy. ABDOMEN: Soft, normal bowel sounds, no distention noted.  No tenderness, rebound or guarding.  BLADDER: Normal PELVIC:  Bladder {:311640}  Urethra: {:311719}  Vulva: {:311722}  Vagina: {:311643}  Cervix: {:311644}  Uterus: {:311718}  Adnexa:  {:311645}  RV: {Blank multiple:19196::"External Exam NormaI","No Rectal Masses","Normal Sphincter tone"}  MUSCULOSKELETAL: Normal range of motion. No tenderness.  No cyanosis, clubbing, or edema.  2+ distal pulses. LYMPHATIC: No Axillary, Supraclavicular, or Inguinal Adenopathy.   Labs: Lab Results  Component Value Date   WBC 4.8 03/12/2023   HGB 13.3 03/12/2023   HCT 41.0 03/12/2023   MCV 91.7 03/12/2023   PLT 294.0 03/12/2023    Lab Results  Component Value Date   CREATININE 0.80 03/12/2023   BUN 13 03/12/2023   NA 139 03/12/2023   K 4.5 03/12/2023   CL 104 03/12/2023   CO2 27 03/12/2023    Lab Results  Component Value Date   ALT 16 03/12/2023   AST 19 03/12/2023   ALKPHOS 65 03/12/2023   BILITOT 0.4 03/12/2023    Lab Results  Component Value Date   CHOL 227 (H) 03/12/2023   HDL 57.90 03/12/2023   LDLCALC 145 (H) 03/12/2023   LDLDIRECT 157.0 03/12/2023   TRIG 120.0 03/12/2023   CHOLHDL 4 03/12/2023    Lab Results  Component Value Date   TSH 0.81 03/12/2023    Lab Results  Component Value Date   HGBA1C 5.9 03/12/2023     Assessment:   No diagnosis found.   Plan:  Pap: {Blank multiple:19196::"Pap, Reflex if ASCUS","Pap Co Test","GC/CT NAAT","Not needed","Not done"} Mammogram: {Blank multiple:19196::"***","Ordered","Not Ordered","Not Indicated"} Colon Screening:  {Blank multiple:19196::"***","Ordered","Not Ordered","Not Indicated"} Labs: {Blank multiple:19196::"Lipid 1","FBS","TSH","Hemoglobin A1C","Vit D Level""***"} Routine preventative health maintenance measures emphasized: {Blank multiple:19196::"Exercise/Diet/Weight control","Tobacco Warnings","Alcohol/Substance use risks","Stress Management","Peer Pressure Issues","Safe Sex"} Flu vaccine status: COVID Vaccination status: Return to Clinic - 1 Year   Loney Laurence, [facility]

## 2023-04-03 ENCOUNTER — Ambulatory Visit (INDEPENDENT_AMBULATORY_CARE_PROVIDER_SITE_OTHER): Payer: 59 | Admitting: Obstetrics and Gynecology

## 2023-04-03 ENCOUNTER — Encounter: Payer: Self-pay | Admitting: Obstetrics and Gynecology

## 2023-04-03 VITALS — BP 99/71 | HR 88 | Ht 69.0 in | Wt 188.3 lb

## 2023-04-03 DIAGNOSIS — Z8372 Family history of familial adenomatous polyposis: Secondary | ICD-10-CM

## 2023-04-03 DIAGNOSIS — Z01419 Encounter for gynecological examination (general) (routine) without abnormal findings: Secondary | ICD-10-CM

## 2023-04-03 DIAGNOSIS — Z0000 Encounter for general adult medical examination without abnormal findings: Secondary | ICD-10-CM

## 2023-04-03 DIAGNOSIS — I10 Essential (primary) hypertension: Secondary | ICD-10-CM

## 2023-04-03 DIAGNOSIS — E663 Overweight: Secondary | ICD-10-CM

## 2023-04-03 DIAGNOSIS — K5904 Chronic idiopathic constipation: Secondary | ICD-10-CM

## 2023-04-29 ENCOUNTER — Other Ambulatory Visit: Payer: Self-pay | Admitting: Internal Medicine

## 2023-05-09 ENCOUNTER — Ambulatory Visit
Admission: RE | Admit: 2023-05-09 | Discharge: 2023-05-09 | Disposition: A | Discharge status: Home / Self Care | Payer: 59 | Source: Ambulatory Visit | Attending: Internal Medicine | Admitting: Internal Medicine

## 2023-05-09 DIAGNOSIS — Z1231 Encounter for screening mammogram for malignant neoplasm of breast: Secondary | ICD-10-CM | POA: Insufficient documentation

## 2023-06-25 ENCOUNTER — Encounter: Payer: Self-pay | Admitting: Internal Medicine

## 2023-06-25 ENCOUNTER — Telehealth: Payer: 59 | Admitting: Internal Medicine

## 2023-06-25 VITALS — Ht 69.0 in

## 2023-06-25 DIAGNOSIS — J0100 Acute maxillary sinusitis, unspecified: Secondary | ICD-10-CM | POA: Diagnosis not present

## 2023-06-25 MED ORDER — AMOXICILLIN-POT CLAVULANATE 875-125 MG PO TABS
1.0000 | ORAL_TABLET | Freq: Two times a day (BID) | ORAL | 0 refills | Status: DC
Start: 2023-06-25 — End: 2023-12-20

## 2023-06-25 MED ORDER — HYDROCOD POLI-CHLORPHE POLI ER 10-8 MG/5ML PO SUER
5.0000 mL | Freq: Two times a day (BID) | ORAL | 0 refills | Status: DC | PRN
Start: 2023-06-25 — End: 2023-11-27

## 2023-06-25 NOTE — Assessment & Plan Note (Signed)
-  Patient presented for a video visit today with acute left maxillary sinus congestion with associated dry cough over the last week which has not been responsive to over-the-counter treatments including saline nasal rinses, Tessalon Perles and Robitussin.  She denies any fevers or chills. -I suspect the patient has an acute maxillary sinusitis.  Given that she has had symptoms for over a week and has had sinus surgery in the past I will treat her with Augmentin to complete a 5-day course -Patient's cough has been worsening over the last week and she has been unable to sleep secondary to this cough.  I will call in chlorpheniramine/hydrocodone for her to help with the symptoms. -Explained to the patient to use the cough syrup mainly at night as it can cause drowsiness and may make it difficult to drive -Return precautions given to the patient -No further workup at this time

## 2023-06-25 NOTE — Progress Notes (Signed)
Virtual Visit via Video Note  I connected with Dawn Griffith@ on 06/25/23 at 2:15 PM by a video enabled telemedicine application and verified that I am speaking with the correct person using two identifiers.  Location patient: home Location provider:work or home office Persons participating in the virtual visit: patient, provider  I discussed the limitations of evaluation and management by telemedicine and the availability of in person appointments. The patient expressed understanding and agreed to proceed.   HPI:  Patient states that she developed acute left maxillary sinus congestion 1 week ago with pressure over the left side of her face as well as behind her left eye.  She also complained of associated dry cough and postnasal drip.  The cough is particularly troublesome and keeps her up at night.  Patient states that she has tried saline nasal rinses and has tried over-the-counter Robitussin as well as Lawyer for her cough with no relief.  She does complain of some tenderness over the lymph nodes over the left side of her neck.  No fevers or chills.  She does have a sick contact (grandson was sick prior to the onset of her symptoms)  ROS: See pertinent positives and negatives per HPI.  Past Medical History:  Diagnosis Date   Anxiety 12/30/2010   Cancer [facility])    as a child   Chronic constipation    Family history of FAP (familial adenomatous polyposis) 07/07/2014   5 yr interval screening Last colonoscopy 2018   Fibroid uterus 07/07/2014   Noted during pelvic ultrasound in 2014 for fullness on exam    Goiter diffuse 03/05/2014   Repeat ultrasound of thyroid is unchanged.     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    Prediabetes 01/02/2016   Vitamin D deficiency 07/04/2016    Past Surgical  History:  Procedure Laterality Date   CESAREAN SECTION  1992   CHOLECYSTECTOMY  Dec 2012   [facility]   COLONOSCOPY     ECTOPIC PREGNANCY SURGERY  1997   LYMPH NODE BIOPSY     NASAL SINUS SURGERY      Family History  Problem Relation Age of Onset   Arrhythmia Mother    Hypertension Mother    Colon polyps Mother        s/p colectomy    Kidney disease Mother        CKD Stage 4   Uterine cancer Mother    Diabetes Mother    Hypertension Father    Diabetes Father    Hypertension Brother    Breast cancer Neg Hx     SOCIAL HX:    Current Outpatient Medications:    amoxicillin-clavulanate (AUGMENTIN) 875-125 MG tablet, Take 1 tablet by mouth 2 (two) times daily., Disp: 10 tablet, Rfl: 0   chlorpheniramine-HYDROcodone (TUSSIONEX) 10-8 MG/5ML, Take 5 mLs by mouth every 12 (twelve) hours as needed for cough., Disp: 115 mL, Rfl: 0   ALPRAZolam (XANAX) 0.5 MG tablet, TAKE 1 TABLET BY MOUTH TWICE A DAY AS NEEDED FOR SLEEP AND ANXIETY, Disp: 60 tablet, Rfl: 1   amitriptyline (ELAVIL) 100 MG tablet, TAKE 1 TABLET BY MOUTH AT BEDTIME, Disp: 90 tablet, Rfl: 1   Ascorbic Acid (VITAMIN C) 1000 MG tablet, Take 1,000 mg by mouth daily., Disp: ,  Rfl:    cetirizine (ZYRTEC) 10 MG tablet, Take 10 mg by mouth daily., Disp: , Rfl:    cholecalciferol (VITAMIN D) 1000 units tablet, Take 1,000 Units by mouth daily., Disp: , Rfl:    cyclobenzaprine (FLEXERIL) 5 MG tablet, Take 1 tablet (5 mg total) by mouth 3 (three) times daily as needed for muscle spasms. (Patient not taking: Reported on 12/22/2022), Disp: 30 tablet, Rfl: 1   docusate sodium (COLACE) 100 MG capsule, Take 100 mg by mouth 2 (two) times daily., Disp: , Rfl:    Lactobacillus (PROBIOTIC ACIDOPHILUS PO), Take 1 tablet by mouth daily., Disp: , Rfl:    losartan (COZAAR) 100 MG tablet, TAKE 1 TABLET BY MOUTH DAILY, Disp: 30 tablet, Rfl: 1   lubiprostone (AMITIZA) 24 MCG capsule, TAKE ONE CAPSULE BY MOUTH TWICE A DAY WITH MEALS, Disp: 60 capsule, Rfl:  5   Multiple Vitamin (MULTI-VITAMIN DAILY PO), Take by mouth., Disp: , Rfl:    omeprazole (PRILOSEC) 40 MG capsule, TAKE 1 CAPSULE BY MOUTH DAILY, Disp: 90 capsule, Rfl: 3   PARoxetine (PAXIL-CR) 25 MG 24 hr tablet, Take 1 tablet (25 mg total) by mouth daily., Disp: 90 tablet, Rfl: 1   rOPINIRole (REQUIP) 0.25 MG tablet, Take 1 tablet (0.25 mg total) by mouth at bedtime., Disp: 90 tablet, Rfl: 2   spironolactone (ALDACTONE) 25 MG tablet, TAKE 1 TABLET BY MOUTH DAILY, Disp: 30 tablet, Rfl: 1   vitamin B-12 (CYANOCOBALAMIN) 1000 MCG tablet, Take 1,000 mcg by mouth daily., Disp: , Rfl:    zolpidem (AMBIEN CR) 12.5 MG CR tablet, TAKE 1 TABLET BY MOUTH EVERY NIGHT AT BEDTIME AS NEEDED FOR SLEEP, Disp: 30 tablet, Rfl: 5  EXAM:  VITALS per patient if applicable:  GENERAL: alert, oriented, appears well and in no acute distress  HEENT: atraumatic, conjunc patient presented for tiva clear, no obvious abnormalities on inspection of external nose and ears  NECK: normal movements of the head and neck  LUNGS: on inspection no signs of respiratory distress, breathing rate appears normal, no obvious gross SOB, gasping or wheezing  CV: no obvious cyanosis  MS: moves all visible extremities without noticeable abnormality  PSYCH/NEURO: pleasant and cooperative, no obvious depression or anxiety, speech and thought processing grossly intact  ASSESSMENT AND PLAN:  Discussed the following assessment and plan:  Acute non-recurrent maxillary sinusitis - Plan: chlorpheniramine-HYDROcodone (TUSSIONEX) 10-8 MG/5ML, amoxicillin-clavulanate (AUGMENTIN) 875-125 MG tablet     I discussed the assessment and treatment plan with the patient. The patient was provided an opportunity to ask questions and all were answered. The patient agreed with the plan and demonstrated an understanding of the instructions.   The patient was advised to call back or seek an in-person evaluation if the symptoms worsen or if the  condition fails to improve as anticipated.  I provided 15 minutes of non-face-to-face time during this encounter.   Earl Lagos, MD

## 2023-06-27 ENCOUNTER — Other Ambulatory Visit: Payer: Self-pay | Admitting: Internal Medicine

## 2023-07-19 ENCOUNTER — Other Ambulatory Visit: Payer: Self-pay | Admitting: Internal Medicine

## 2023-08-20 ENCOUNTER — Other Ambulatory Visit: Payer: Self-pay | Admitting: Internal Medicine

## 2023-08-21 NOTE — Telephone Encounter (Signed)
 Refilled: 02/02/2023 Last OV: 03/12/2023 Next OV: not scheduled

## 2023-09-10 ENCOUNTER — Other Ambulatory Visit: Payer: Self-pay | Admitting: Internal Medicine

## 2023-09-13 DIAGNOSIS — L814 Other melanin hyperpigmentation: Secondary | ICD-10-CM | POA: Diagnosis not present

## 2023-09-13 DIAGNOSIS — L821 Other seborrheic keratosis: Secondary | ICD-10-CM | POA: Diagnosis not present

## 2023-09-13 DIAGNOSIS — D1801 Hemangioma of skin and subcutaneous tissue: Secondary | ICD-10-CM | POA: Diagnosis not present

## 2023-09-13 DIAGNOSIS — D229 Melanocytic nevi, unspecified: Secondary | ICD-10-CM | POA: Diagnosis not present

## 2023-09-13 DIAGNOSIS — L578 Other skin changes due to chronic exposure to nonionizing radiation: Secondary | ICD-10-CM | POA: Diagnosis not present

## 2023-09-15 ENCOUNTER — Other Ambulatory Visit: Payer: Self-pay | Admitting: Internal Medicine

## 2023-09-25 ENCOUNTER — Other Ambulatory Visit: Payer: Self-pay | Admitting: Internal Medicine

## 2023-10-15 ENCOUNTER — Other Ambulatory Visit: Payer: Self-pay | Admitting: Internal Medicine

## 2023-10-24 ENCOUNTER — Other Ambulatory Visit: Payer: Self-pay | Admitting: Internal Medicine

## 2023-10-24 NOTE — Telephone Encounter (Signed)
 Refilled: 09/25/2023 Last OV: 03/12/2023 Next OV: not scheduled

## 2023-11-27 ENCOUNTER — Encounter: Payer: Self-pay | Admitting: Internal Medicine

## 2023-11-27 DIAGNOSIS — I10 Essential (primary) hypertension: Secondary | ICD-10-CM | POA: Diagnosis not present

## 2023-11-27 DIAGNOSIS — J0100 Acute maxillary sinusitis, unspecified: Secondary | ICD-10-CM | POA: Diagnosis not present

## 2023-11-27 DIAGNOSIS — Z6826 Body mass index (BMI) 26.0-26.9, adult: Secondary | ICD-10-CM | POA: Diagnosis not present

## 2023-11-27 DIAGNOSIS — R051 Acute cough: Secondary | ICD-10-CM | POA: Diagnosis not present

## 2023-11-27 MED ORDER — HYDROCOD POLI-CHLORPHE POLI ER 10-8 MG/5ML PO SUER
5.0000 mL | Freq: Two times a day (BID) | ORAL | 0 refills | Status: DC | PRN
Start: 2023-11-27 — End: 2024-01-02

## 2023-12-17 ENCOUNTER — Ambulatory Visit: Payer: Self-pay | Admitting: *Deleted

## 2023-12-17 ENCOUNTER — Emergency Department

## 2023-12-17 ENCOUNTER — Encounter: Payer: Self-pay | Admitting: Intensive Care

## 2023-12-17 ENCOUNTER — Other Ambulatory Visit: Payer: Self-pay

## 2023-12-17 ENCOUNTER — Inpatient Hospital Stay

## 2023-12-17 ENCOUNTER — Inpatient Hospital Stay
Admission: EM | Admit: 2023-12-17 | Discharge: 2023-12-20 | DRG: 390 | Disposition: A | Discharge status: Home / Self Care | Attending: Internal Medicine | Admitting: Internal Medicine

## 2023-12-17 DIAGNOSIS — Z8249 Family history of ischemic heart disease and other diseases of the circulatory system: Secondary | ICD-10-CM

## 2023-12-17 DIAGNOSIS — E663 Overweight: Secondary | ICD-10-CM | POA: Diagnosis not present

## 2023-12-17 DIAGNOSIS — I10 Essential (primary) hypertension: Secondary | ICD-10-CM | POA: Diagnosis present

## 2023-12-17 DIAGNOSIS — F411 Generalized anxiety disorder: Secondary | ICD-10-CM | POA: Diagnosis not present

## 2023-12-17 DIAGNOSIS — Z6825 Body mass index (BMI) 25.0-25.9, adult: Secondary | ICD-10-CM

## 2023-12-17 DIAGNOSIS — Z79899 Other long term (current) drug therapy: Secondary | ICD-10-CM

## 2023-12-17 DIAGNOSIS — Z833 Family history of diabetes mellitus: Secondary | ICD-10-CM | POA: Diagnosis not present

## 2023-12-17 DIAGNOSIS — K56609 Unspecified intestinal obstruction, unspecified as to partial versus complete obstruction: Secondary | ICD-10-CM | POA: Diagnosis not present

## 2023-12-17 DIAGNOSIS — E785 Hyperlipidemia, unspecified: Secondary | ICD-10-CM | POA: Diagnosis present

## 2023-12-17 DIAGNOSIS — J9811 Atelectasis: Secondary | ICD-10-CM | POA: Diagnosis not present

## 2023-12-17 DIAGNOSIS — G4700 Insomnia, unspecified: Secondary | ICD-10-CM | POA: Diagnosis not present

## 2023-12-17 DIAGNOSIS — R0989 Other specified symptoms and signs involving the circulatory and respiratory systems: Secondary | ICD-10-CM | POA: Diagnosis not present

## 2023-12-17 DIAGNOSIS — K219 Gastro-esophageal reflux disease without esophagitis: Secondary | ICD-10-CM | POA: Diagnosis present

## 2023-12-17 DIAGNOSIS — Z8719 Personal history of other diseases of the digestive system: Secondary | ICD-10-CM | POA: Diagnosis present

## 2023-12-17 DIAGNOSIS — R7303 Prediabetes: Secondary | ICD-10-CM | POA: Diagnosis not present

## 2023-12-17 DIAGNOSIS — R112 Nausea with vomiting, unspecified: Secondary | ICD-10-CM | POA: Diagnosis not present

## 2023-12-17 DIAGNOSIS — R140 Abdominal distension (gaseous): Secondary | ICD-10-CM | POA: Diagnosis not present

## 2023-12-17 DIAGNOSIS — Z9049 Acquired absence of other specified parts of digestive tract: Secondary | ICD-10-CM | POA: Diagnosis not present

## 2023-12-17 DIAGNOSIS — R109 Unspecified abdominal pain: Secondary | ICD-10-CM | POA: Diagnosis not present

## 2023-12-17 DIAGNOSIS — R0789 Other chest pain: Secondary | ICD-10-CM | POA: Diagnosis not present

## 2023-12-17 DIAGNOSIS — I700 Atherosclerosis of aorta: Secondary | ICD-10-CM | POA: Diagnosis not present

## 2023-12-17 DIAGNOSIS — Z4682 Encounter for fitting and adjustment of non-vascular catheter: Secondary | ICD-10-CM | POA: Diagnosis not present

## 2023-12-17 DIAGNOSIS — R079 Chest pain, unspecified: Secondary | ICD-10-CM | POA: Diagnosis not present

## 2023-12-17 LAB — BASIC METABOLIC PANEL WITH GFR
Anion gap: 13 (ref 5–15)
BUN: 7 mg/dL (ref 6–20)
CO2: 23 mmol/L (ref 22–32)
Calcium: 10.8 mg/dL — ABNORMAL HIGH (ref 8.9–10.3)
Chloride: 102 mmol/L (ref 98–111)
Creatinine, Ser: 0.79 mg/dL (ref 0.44–1.00)
GFR, Estimated: >60 mL/min (ref >60)
Glucose, Bld: 157 mg/dL — ABNORMAL HIGH (ref 70–99)
Potassium: 3.8 mmol/L (ref 3.5–5.1)
Sodium: 138 mmol/L (ref 135–145)

## 2023-12-17 LAB — CBC
HCT: 41.5 % (ref 36.0–46.0)
Hemoglobin: 14.3 g/dL (ref 12.0–15.0)
MCH: 29.9 pg (ref 26.0–34.0)
MCHC: 34.5 g/dL (ref 30.0–36.0)
MCV: 86.8 fL (ref 80.0–100.0)
Platelets: 391 K/uL (ref 150–400)
RBC: 4.78 MIL/uL (ref 3.87–5.11)
RDW: 12.5 % (ref 11.5–15.5)
WBC: 16.5 K/uL — ABNORMAL HIGH (ref 4.0–10.5)
nRBC: 0 % (ref 0.0–0.2)

## 2023-12-17 LAB — TROPONIN I (HIGH SENSITIVITY)
Troponin I (High Sensitivity): 3 ng/L (ref <18)
Troponin I (High Sensitivity): 3 ng/L (ref <18)

## 2023-12-17 LAB — LACTIC ACID, PLASMA: Lactic Acid, Venous: 1 mmol/L (ref 0.5–1.9)

## 2023-12-17 MED ORDER — KETOROLAC TROMETHAMINE 15 MG/ML IJ SOLN
15.0000 mg | Freq: Four times a day (QID) | INTRAMUSCULAR | Status: DC | PRN
  Administered 2023-12-18 – 2023-12-19 (×3): 15 mg via INTRAVENOUS
  Filled 2023-12-17 (×3): qty 1

## 2023-12-17 MED ORDER — ENOXAPARIN SODIUM 40 MG/0.4ML IJ SOSY
40.0000 mg | PREFILLED_SYRINGE | INTRAMUSCULAR | Status: DC
  Administered 2023-12-17 – 2023-12-19 (×3): 40 mg via SUBCUTANEOUS
  Filled 2023-12-17 (×3): qty 0.4

## 2023-12-17 MED ORDER — ONDANSETRON 4 MG PO TBDP
4.0000 mg | ORAL_TABLET | Freq: Once | ORAL | Status: AC | PRN
  Administered 2023-12-17: 4 mg via ORAL
  Filled 2023-12-17: qty 1

## 2023-12-17 MED ORDER — ONDANSETRON HCL 4 MG PO TABS: 4.0000 mg | ORAL_TABLET | Freq: Four times a day (QID) | ORAL | Status: DC | PRN

## 2023-12-17 MED ORDER — ONDANSETRON HCL 4 MG/2ML IJ SOLN: 4.0000 mg | Freq: Four times a day (QID) | INTRAMUSCULAR | Status: DC | PRN

## 2023-12-17 MED ORDER — METHOCARBAMOL 1000 MG/10ML IJ SOLN: 500.0000 mg | Freq: Four times a day (QID) | INTRAMUSCULAR | Status: DC | PRN

## 2023-12-17 MED ORDER — IOHEXOL 300 MG/ML  SOLN
100.0000 mL | Freq: Once | INTRAMUSCULAR | Status: AC | PRN
Start: 2023-12-17 — End: 2023-12-17
  Administered 2023-12-17: 100 mL via INTRAVENOUS

## 2023-12-17 MED ORDER — LACTATED RINGERS IV BOLUS
1000.0000 mL | Freq: Once | INTRAVENOUS | Status: AC
  Administered 2023-12-17: 1000 mL via INTRAVENOUS

## 2023-12-17 MED ORDER — MORPHINE SULFATE (PF) 4 MG/ML IV SOLN
4.0000 mg | INTRAVENOUS | Status: DC | PRN
  Administered 2023-12-17 – 2023-12-18 (×3): 4 mg via INTRAVENOUS
  Filled 2023-12-17 (×3): qty 1

## 2023-12-17 MED ORDER — FAMOTIDINE IN NACL 20-0.9 MG/50ML-% IV SOLN
20.0000 mg | Freq: Once | INTRAVENOUS | Status: AC
  Administered 2023-12-17: 20 mg via INTRAVENOUS
  Filled 2023-12-17: qty 50

## 2023-12-17 MED ORDER — ONDANSETRON HCL 4 MG/2ML IJ SOLN
4.0000 mg | Freq: Once | INTRAMUSCULAR | Status: AC
  Administered 2023-12-17: 4 mg via INTRAVENOUS
  Filled 2023-12-17: qty 2

## 2023-12-17 MED ORDER — LACTATED RINGERS IV SOLN: INTRAVENOUS | Status: AC

## 2023-12-17 MED ORDER — LORAZEPAM 2 MG/ML IJ SOLN
1.0000 mg | INTRAMUSCULAR | Status: DC | PRN
  Administered 2023-12-17 – 2023-12-19 (×8): 1 mg via INTRAVENOUS
  Filled 2023-12-17 (×8): qty 1

## 2023-12-17 MED ORDER — METOPROLOL TARTRATE 5 MG/5ML IV SOLN: 5.0000 mg | Freq: Four times a day (QID) | INTRAVENOUS | Status: DC | PRN

## 2023-12-17 NOTE — ED Provider Notes (Signed)
 [facility] Provider Note   Event Date/Time   First MD Initiated Contact with Patient 12/17/23 1706     (approximate) History  Chest Pain and Abdominal Pain  HPI Dawn Griffith is a 58 y.o. female with a past history of cholecystectomy, hypertension, hyperlipidemia, fibroids, and chronic constipation who presents complaining of midepigastric abdominal pain with associated nausea/vomiting that began earlier today.  Patient states that she has only had a protein shake which did not worsen her pain but has been regurgitated at this time.  Patient has not tried any other p.o. intake.  Patient denies any recent travel, sick contacts, or food out of the ordinary.  Patient describes this pain as a stabbing epigastric pain that does not radiate and has no exacerbating or relieving factors ROS: Patient currently denies any vision changes, tinnitus, difficulty speaking, facial droop, sore throat, chest pain, shortness of breath, abdominal pain, nausea/vomiting/diarrhea, dysuria, or weakness/numbness/paresthesias in any extremity   Physical Exam  Triage Vital Signs: ED Triage Vitals [12/17/23 1648]  Encounter Vitals Group     BP 102/85     Girls Systolic BP Percentile      Girls Diastolic BP Percentile      Boys Systolic BP Percentile      Boys Diastolic BP Percentile      Pulse Rate (!) 117     Resp 18     Temp 98.4 F (36.9 C)     Temp Source Oral     SpO2 98 %     Weight 180 lb (81.6 kg)     Height 5' 9 (1.753 m)     Head Circumference      Peak Flow      Pain Score 3     Pain Loc      Pain Education      Exclude from Growth Chart    Most recent vital signs: Vitals:   12/17/23 1648  BP: 102/85  Pulse: (!) 117  Resp: 18  Temp: 98.4 F (36.9 C)  SpO2: 98%   General: Awake, oriented x4. CV:  Good peripheral perfusion. Resp:  Normal effort. Abd:  No distention. Other:  Middle-aged overweight Caucasian female resting comfortably in no acute distress ED  Results / Procedures / Treatments  Labs (all labs ordered are listed, but only abnormal results are displayed) Labs Reviewed  BASIC METABOLIC PANEL WITH GFR - Abnormal; Notable for the following components:      Result Value   Glucose, Bld 157 (*)    Calcium 10.8 (*)    All other components within normal limits  CBC - Abnormal; Notable for the following components:   WBC 16.5 (*)    All other components within normal limits  TROPONIN I (HIGH SENSITIVITY)  TROPONIN I (HIGH SENSITIVITY)   EKG ED ECG REPORT I, Artist MARLA Kerns, the attending physician, personally viewed and interpreted this ECG. Date: 12/17/2023 EKG Time: 1655 Rate: 104 Rhythm: Tachycardic sinus rhythm QRS Axis: normal Intervals: normal ST/T Wave abnormalities: normal Narrative Interpretation: Tachycardic sinus rhythm.  No evidence of acute ischemia RADIOLOGY ED MD interpretation: CT of the abdomen and pelvis with IV contrast shows moderate small bowel dilation with transition zone in the central portion of the abdomen consistent with small bowel obstruction  2 view chest x-ray interpreted by me shows no evidence of acute abnormalities including no pneumonia, pneumothorax, or widened mediastinum - All radiology independently interpreted and agree with radiology assessment Official radiology report(s): CT ABDOMEN PELVIS  W CONTRAST Result Date: 12/17/2023 CLINICAL DATA:  Acute generalized abdominal pain. EXAM: CT ABDOMEN AND PELVIS WITH CONTRAST TECHNIQUE: Multidetector CT imaging of the abdomen and pelvis was performed using the standard protocol following bolus administration of intravenous contrast. RADIATION DOSE REDUCTION: This exam was performed according to the departmental dose-optimization program which includes automated exposure control, adjustment of the mA and/or kV according to patient size and/or use of iterative reconstruction technique. CONTRAST:  OMNIPAQUE  IOHEXOL  300 MG/ML  SOLN COMPARISON:  None  Available. FINDINGS: Lower chest: No acute abnormality. Hepatobiliary: No focal liver abnormality is seen. Status post cholecystectomy. No biliary dilatation. Pancreas: Unremarkable. No pancreatic ductal dilatation or surrounding inflammatory changes. Spleen: Normal in size without focal abnormality. Adrenals/Urinary Tract: Adrenal glands are unremarkable. Kidneys are normal, without renal calculi, focal lesion, or hydronephrosis. Bladder is unremarkable. Stomach/Bowel: Mild gastric distention is noted. Moderate proximal small bowel dilatation is noted with transition zone seen in central portion of abdomen best noted on image number 25 of series 5. No colonic dilatation is noted. The appendix is not clearly visualized, but no inflammation is noted in the right lower quadrant. Vascular/Lymphatic: Aortic atherosclerosis. No enlarged abdominal or pelvic lymph nodes. Reproductive: Uterus and bilateral adnexa are unremarkable. Other: No ascites or hernia is noted. Musculoskeletal: No acute or significant osseous findings. IMPRESSION: Moderate small bowel dilatation is noted with transition zone seen in central portion of abdomen consistent with small-bowel obstruction. Potentially due to adhesion. Aortic Atherosclerosis (ICD10-I70.0). Electronically Signed   By: Lynwood Landy Raddle M.D.   On: 12/17/2023 18:24   DG Chest 2 View Result Date: 12/17/2023 CLINICAL DATA:  cp EXAM: CHEST - 2 VIEW COMPARISON:  None available. FINDINGS: Low lung volumes. Streaky bibasilar atelectasis. No focal airspace consolidation, pleural effusion, or pneumothorax. No cardiomegaly. Tortuous aorta with aortic atherosclerosis. No acute fracture or destructive lesions. Multilevel thoracic osteophytosis. IMPRESSION: Low lung volumes with streaky bibasilar atelectasis. Electronically Signed   By: Rogelia Myers M.D.   On: 12/17/2023 17:24   PROCEDURES: Critical Care performed: No Procedures MEDICATIONS ORDERED IN ED: Medications   ondansetron  (ZOFRAN -ODT) disintegrating tablet 4 mg (4 mg Oral Given 12/17/23 1652)  ondansetron  (ZOFRAN ) injection 4 mg (4 mg Intravenous Given 12/17/23 1740)  lactated ringers  bolus 1,000 mL (0 mLs Intravenous Stopped 12/17/23 1923)  famotidine  (PEPCID ) IVPB 20 mg premix (0 mg Intravenous Stopped 12/17/23 1815)  iohexol  (OMNIPAQUE ) 300 MG/ML solution 100 mL (100 mLs Intravenous Contrast Given 12/17/23 1757)   IMPRESSION / MDM / ASSESSMENT AND PLAN / ED COURSE  I reviewed the triage vital signs and the nursing notes.                             The patient is on the cardiac monitor to evaluate for evidence of arrhythmia and/or significant heart rate changes. Patient's presentation is most consistent with acute presentation with potential threat to life or bodily function. Given History, Exam I believe patient needs labs and imaging to evaluate for SBO vs other acute abdomen. ED Workup: CBC, BMP, LFTs, CT Abdomen/Pelvis ED Findings: CT: Small Bowel Obstruction  History, Exam, and Workup show no overt evidence of mesenteric ischemia, bowel gangrene, abscess, peritonitis. ED Interventions: Analgesia. Defer ABX at this time. Consult: General Surgery Disposition: Admit   FINAL CLINICAL IMPRESSION(S) / ED DIAGNOSES   Final diagnoses:  Small bowel obstruction (HCC)  Nausea and vomiting, unspecified vomiting type   Rx / DC Orders   ED  Discharge Orders     None      Note:  This document was prepared using Dragon voice recognition software and may include unintentional dictation errors.   Jossie Artist POUR, MD 12/17/23 [phone]

## 2023-12-17 NOTE — ED Triage Notes (Signed)
 Patient presents with abdominal pain, chest pain and emesis. Patient is diaphoretic

## 2023-12-17 NOTE — H&P (Signed)
 History and Physical    Patient: Dawn Griffith FMW:[id number] DOB: 1965/07/01 DOA: 12/17/2023 DOS: the patient was seen and examined on 12/17/2023 PCP: Marylynn Verneita CROME, MD  Patient coming from: Home  Chief Complaint:  Chief Complaint  Patient presents with   Chest Pain   Abdominal Pain   HPI: Dawn Griffith is a 58 y.o. female with medical history significant of HTN and chronic constipation presenting to the emergency department for evaluation of mid epigastric abdominal pain that started acutely today.  She has associated nausea, vomiting, abdominal distention.  She reports diarrhea on Friday and Saturday which has resolved, denies having a normal bowel movement since. In the emergency department, she was tachycardic with rate of 117, otherwise hemodynamically stable, afebrile, saturating appropriately on room air.  She had leukocytosis of 16.5 and CT evidence of SBO.  She is status post cholecystectomy and C-section. ED physician spoke with on-call general surgeon who recommended admission and NG tube placement.  Will admit for further management of SBO.  Review of Systems: Review of Systems  Constitutional:  Negative for chills, fever, malaise/fatigue and weight loss.  Eyes:  Negative for blurred vision, double vision and photophobia.  Respiratory:  Negative for cough, sputum production and shortness of breath.   Cardiovascular:  Negative for chest pain and leg swelling.  Gastrointestinal:  Positive for abdominal pain, constipation, diarrhea, nausea and vomiting. Negative for blood in stool, heartburn and melena.  Genitourinary:  Negative for dysuria, flank pain, frequency and hematuria.  Musculoskeletal:  Positive for back pain and joint pain. Negative for myalgias.  Skin:  Negative for itching and rash.  Neurological:  Negative for sensory change, speech change, focal weakness and loss of consciousness.    Past Medical History:  Diagnosis Date   Anxiety 12/30/2010   Cancer [facility])     as a child   Chronic constipation    Family history of FAP (familial adenomatous polyposis) 07/07/2014   5 yr interval screening Last colonoscopy 2018   Fibroid uterus 07/07/2014   Noted during pelvic ultrasound in 2014 for fullness on exam    Goiter diffuse 03/05/2014   Repeat ultrasound of thyroid  is unchanged.     Hyperlipidemia LDL goal <130 11/07/2013   Hypertension    Insomnia secondary to anxiety 07/04/2016   Overweight (BMI 25.0-29.9) 11/04/2012   Goal weight for BMI < 25 based on height of 5'9.5 is < 170 lbs Body mass index is 27.52 kg/m.    Prediabetes 01/02/2016   Vitamin D  deficiency 07/04/2016   Past Surgical History:  Procedure Laterality Date   CESAREAN SECTION  1992   CHOLECYSTECTOMY  Dec 2012   S[facility]ar   COLONOSCOPY     ECTOPIC PREGNANCY SURGERY  1997   LYMPH NODE BIOPSY     NASAL SINUS SURGERY     Social History:  reports that she has never smoked. She has never used smokeless tobacco. She reports current alcohol use. She reports that she does not use drugs.  No Known Allergies  Family History  Problem Relation Age of Onset   Arrhythmia Mother    Hypertension Mother    Colon polyps Mother        s/p colectomy    Kidney disease Mother        CKD Stage 4   Uterine cancer Mother    Diabetes Mother    Hypertension Father    Diabetes Father    Hypertension Brother    Breast cancer Neg Hx  Prior to Admission medications   Medication Sig Start Date End Date Taking? Authorizing Provider  ALPRAZolam  (XANAX ) 0.5 MG tablet TAKE 1 TABLET BY MOUTH TWICE A DAY AS NEEDED FOR SLEEP AND ANXIETY 10/10/22   Marylynn Verneita CROME, MD  amitriptyline  (ELAVIL ) 100 MG tablet TAKE 1 TABLET BY MOUTH AT BEDTIME 07/19/23   Tullo, Teresa L, MD  amoxicillin -clavulanate (AUGMENTIN ) 875-125 MG tablet Take 1 tablet by mouth 2 (two) times daily. 06/25/23   Narendra, Nischal, MD  Ascorbic Acid (VITAMIN C) 1000 MG tablet Take 1,000 mg by mouth daily.    [provider]  cetirizine  (ZYRTEC) 10 MG tablet Take 10 mg by mouth daily.    [provider]  chlorpheniramine-HYDROcodone (TUSSIONEX) 10-8 MG/5ML Take 5 mLs by mouth every 12 (twelve) hours as needed for cough. 11/27/23   Tullo, Teresa L, MD  cholecalciferol (VITAMIN D ) 1000 units tablet Take 1,000 Units by mouth daily.    [provider]  cyclobenzaprine  (FLEXERIL ) 5 MG tablet Take 1 tablet (5 mg total) by mouth 3 (three) times daily as needed for muscle spasms. Patient not taking: Reported on 12/22/2022 04/21/22   Marylynn Verneita CROME, MD  docusate sodium (COLACE) 100 MG capsule Take 100 mg by mouth 2 (two) times daily.    [provider]  Lactobacillus (PROBIOTIC ACIDOPHILUS PO) Take 1 tablet by mouth daily.    [provider]  losartan  (COZAAR ) 100 MG tablet TAKE 1 TABLET BY MOUTH DAILY 06/27/23   Tullo, Teresa L, MD  lubiprostone  (AMITIZA ) 24 MCG capsule TAKE 1 CAPSULE BY MOUTH TWICE A DAY WITH A MEAL 09/10/23   Tullo, Teresa L, MD  Multiple Vitamin (MULTI-VITAMIN DAILY PO) Take by mouth.    [provider]  omeprazole  (PRILOSEC) 40 MG capsule TAKE 1 CAPSULE BY MOUTH DAILY 02/13/23   Marylynn Verneita CROME, MD  PARoxetine  (PAXIL -CR) 25 MG 24 hr tablet TAKE 1 TABLET BY MOUTH DAILY 10/15/23   Marylynn Verneita CROME, MD  rOPINIRole  (REQUIP ) 0.25 MG tablet Take 1 tablet (0.25 mg total) by mouth at bedtime. 03/13/23   Marylynn Verneita CROME, MD  spironolactone  (ALDACTONE ) 25 MG tablet TAKE 1 TABLET BY MOUTH DAILY 06/27/23   Tullo, Teresa L, MD  vitamin B-12 (CYANOCOBALAMIN) 1000 MCG tablet Take 1,000 mcg by mouth daily.    [provider]  zolpidem  (AMBIEN  CR) 12.5 MG CR tablet TAKE 1 TABLET BY MOUTH EVERY NIGHT AT BEDTIME AS NEEDED FOR SLEEP 10/25/23   Marylynn Verneita CROME, MD    Physical Exam: Vitals:   12/17/23 1648 12/17/23 1930  BP: 102/85 (!) 152/95  Pulse: (!) 117 100  Resp: 18 (!) 21  Temp: 98.4 F (36.9 C)   TempSrc: Oral   SpO2: 98% 95%  Weight: 81.6 kg   Height: 5' 9 (1.753 m)     Physical Exam Vitals and nursing note reviewed.  Constitutional:      General: She is not in acute distress.    Appearance: She is well-developed. She is not ill-appearing or toxic-appearing.  HENT:     Head: Normocephalic and atraumatic.     Comments: NGT in place  Eyes:     Extraocular Movements: Extraocular movements intact.     Pupils: Pupils are equal, round, and reactive to light.  Pulmonary:     Effort: Pulmonary effort is normal. No tachypnea or respiratory distress.     Breath sounds: Normal breath sounds.  Abdominal:     General: Bowel sounds are normal.  Palpations: Abdomen is soft.     Tenderness: There is abdominal tenderness (mild diffuse TTP). There is no guarding.  Musculoskeletal:     Cervical back: Normal range of motion and neck supple.     Right lower leg: No edema.     Left lower leg: No edema.  Skin:    General: Skin is warm and dry.     Capillary Refill: Capillary refill takes less than 2 seconds.  Neurological:     General: No focal deficit present.     Mental Status: She is alert and oriented to person, place, and time.  Psychiatric:        Mood and Affect: Mood normal.        Behavior: Behavior normal.     Data Reviewed:    Labs on Admission: I have personally reviewed following labs and imaging studies  CBC: Recent Labs  Lab 12/17/23 1651  WBC 16.5*  HGB 14.3  HCT 41.5  MCV 86.8  PLT 391   Basic Metabolic Panel: Recent Labs  Lab 12/17/23 1651  NA 138  K 3.8  CL 102  CO2 23  GLUCOSE 157*  BUN 7  CREATININE 0.79  CALCIUM 10.8*   GFR: Estimated Creatinine Clearance: 85.5 mL/min (by C-G formula based on SCr of 0.79 mg/dL). Liver Function Tests: No results for input(s): AST, ALT, ALKPHOS, BILITOT, PROT, ALBUMIN in the last 168 hours. No results for input(s): LIPASE, AMYLASE in the last 168 hours. No results for input(s): AMMONIA in the last 168 hours. Coagulation Profile: No results for input(s):  INR, PROTIME in the last 168 hours. Cardiac Enzymes: No results for input(s): CKTOTAL, CKMB, CKMBINDEX, TROPONINI in the last 168 hours. BNP (last 3 results) No results for input(s): PROBNP in the last 8760 hours. HbA1C: No results for input(s): HGBA1C in the last 72 hours. CBG: No results for input(s): GLUCAP in the last 168 hours. Lipid Profile: No results for input(s): CHOL, HDL, LDLCALC, TRIG, CHOLHDL, LDLDIRECT in the last 72 hours. Thyroid  Function Tests: No results for input(s): TSH, T4TOTAL, FREET4, T3FREE, THYROIDAB in the last 72 hours. Anemia Panel: No results for input(s): VITAMINB12, FOLATE, FERRITIN, TIBC, IRON, RETICCTPCT in the last 72 hours. Urine analysis:    Component Value Date/Time   COLORURINE COLORLESS (A) 08/05/2018 2043   APPEARANCEUR CLEAR (A) 08/05/2018 2043   LABSPEC 1.001 (L) 08/05/2018 2043   PHURINE 6.0 08/05/2018 2043   GLUCOSEU NEGATIVE 08/05/2018 2043   HGBUR NEGATIVE 08/05/2018 2043   BILIRUBINUR NEGATIVE 08/05/2018 2043   BILIRUBINUR neg 02/22/2015 1603   KETONESUR NEGATIVE 08/05/2018 2043   PROTEINUR NEGATIVE 08/05/2018 2043   UROBILINOGEN 0.2 02/22/2015 1603   NITRITE NEGATIVE 08/05/2018 2043   LEUKOCYTESUR NEGATIVE 08/05/2018 2043    Radiological Exams on Admission: DG Abd Portable 1 View Result Date: 12/17/2023 CLINICAL DATA:  post ng insertion EXAM: PORTABLE ABDOMEN - 1 VIEW COMPARISON:  December 17, 2023 CT of the abdomen and pelvis FINDINGS: Esophagogastric tube has been placed in the interim, terminating at the GE junction. Distended stomach with an air-fluid level present. A single dilated segment of bowel noted in the right hemiabdomen. No pneumoperitoneum. No organomegaly or radiopaque calculi. Cholecystectomy clips. Excreted contrast in the renal collecting systems bilaterally. The lung bases are clear. IMPRESSION: Esophagogastric tube terminates at the GE junction. 8 cm of advancement  is recommended for optimal positioning. Electronically Signed   By: Rogelia Myers M.D.   On: 12/17/2023 20:39   CT ABDOMEN PELVIS W CONTRAST Result  Date: 12/17/2023 CLINICAL DATA:  Acute generalized abdominal pain. EXAM: CT ABDOMEN AND PELVIS WITH CONTRAST TECHNIQUE: Multidetector CT imaging of the abdomen and pelvis was performed using the standard protocol following bolus administration of intravenous contrast. RADIATION DOSE REDUCTION: This exam was performed according to the departmental dose-optimization program which includes automated exposure control, adjustment of the mA and/or kV according to patient size and/or use of iterative reconstruction technique. CONTRAST:  OMNIPAQUE  IOHEXOL  300 MG/ML  SOLN COMPARISON:  None Available. FINDINGS: Lower chest: No acute abnormality. Hepatobiliary: No focal liver abnormality is seen. Status post cholecystectomy. No biliary dilatation. Pancreas: Unremarkable. No pancreatic ductal dilatation or surrounding inflammatory changes. Spleen: Normal in size without focal abnormality. Adrenals/Urinary Tract: Adrenal glands are unremarkable. Kidneys are normal, without renal calculi, focal lesion, or hydronephrosis. Bladder is unremarkable. Stomach/Bowel: Mild gastric distention is noted. Moderate proximal small bowel dilatation is noted with transition zone seen in central portion of abdomen best noted on image number 25 of series 5. No colonic dilatation is noted. The appendix is not clearly visualized, but no inflammation is noted in the right lower quadrant. Vascular/Lymphatic: Aortic atherosclerosis. No enlarged abdominal or pelvic lymph nodes. Reproductive: Uterus and bilateral adnexa are unremarkable. Other: No ascites or hernia is noted. Musculoskeletal: No acute or significant osseous findings. IMPRESSION: Moderate small bowel dilatation is noted with transition zone seen in central portion of abdomen consistent with small-bowel obstruction. Potentially due to  adhesion. Aortic Atherosclerosis (ICD10-I70.0). Electronically Signed   By: Lynwood Landy Raddle M.D.   On: 12/17/2023 18:24   DG Chest 2 View Result Date: 12/17/2023 CLINICAL DATA:  cp EXAM: CHEST - 2 VIEW COMPARISON:  None available. FINDINGS: Low lung volumes. Streaky bibasilar atelectasis. No focal airspace consolidation, pleural effusion, or pneumothorax. No cardiomegaly. Tortuous aorta with aortic atherosclerosis. No acute fracture or destructive lesions. Multilevel thoracic osteophytosis. IMPRESSION: Low lung volumes with streaky bibasilar atelectasis. Electronically Signed   By: Rogelia Myers M.D.   On: 12/17/2023 17:24       Assessment and Plan: No notes have been filed under this hospital service. Service: Hospitalist  58 year old female with HTN,  chronic constipation, status post cholecystectomy and cesarean section admitted the hospital service for management of SBO  SBO - NG tube to low intermittent suction - General Surgery following, appreciate recommendations - N.p.o. - IV fluid - Pain control  Hypertension - As needed metoprolol  while n.p.o.  Anxiety Insomnia - IV ativan  while NPO  Lovenox  LR @100cc /h Monitor/replace electrolytes NPO   Advance Care Planning:   Code Status: Not on file Discussed with patient at time of admission  Consults: General Surgery  Family Communication: Husband at bedside  Severity of Illness: The appropriate patient status for this patient is INPATIENT. Inpatient status is judged to be reasonable and necessary in order to provide the required intensity of service to ensure the patient's safety. The patient's presenting symptoms, physical exam findings, and initial radiographic and laboratory data in the context of their chronic comorbidities is felt to place them at high risk for further clinical deterioration. Furthermore, it is not anticipated that the patient will be medically stable for discharge from the hospital within 2 midnights  of admission.   * I certify that at the point of admission it is my clinical judgment that the patient will require inpatient hospital care spanning beyond 2 midnights from the point of admission due to high intensity of service, high risk for further deterioration and high frequency of surveillance required.*  Author:  Daved JAYSON Pump, DO 12/17/2023 8:51 PM  For on call review [email or web address].

## 2023-12-17 NOTE — Telephone Encounter (Signed)
 FYI Only or Action Required?: FYI only for provider.  Patient was last seen in primary care on 06/25/2023 by Onesimo Claude, MD.  Called Nurse Triage reporting Abdominal Pain.  Symptoms began today.  Interventions attempted: OTC medications: tums.  Symptoms are: gradually worsening.  Triage Disposition: See HCP Within 4 Hours (Or PCP Triage)  Patient/caregiver understands and will follow disposition?: Unsure          Copied from CRM [id number]. Topic: Clinical - Red Word Triage >> Dec 17, 2023  3:40 PM Deaijah H wrote: Red Word that prompted transfer to Nurse Triage: Abdominal bloating w/ pain and nauseated Reason for Disposition  [1] MILD-MODERATE pain AND [2] constant AND [3] present > 2 hours  Answer Assessment - Initial Assessment Questions No available appt today . Recommended UC or ED.      1. LOCATION: Where does it hurt?      Under breast plate 2. RADIATION: Does the pain shoot anywhere else? (e.g., chest, back)     No  3. ONSET: When did the pain begin? (e.g., minutes, hours or days ago)      This am  4. SUDDEN: Gradual or sudden onset?     gradual 5. PATTERN Does the pain come and go, or is it constant?     Constant , throbbing  6. SEVERITY: How bad is the pain?  (e.g., Scale 1-10; mild, moderate, or severe)     3/10 bloating  7. RECURRENT SYMPTOM: Have you ever had this type of stomach pain before? If Yes, ask: When was the last time? and What happened that time?      No  8. CAUSE: What do you think is causing the stomach pain? (e.g., gallstones, recent abdominal surgery)     na 9. RELIEVING/AGGRAVATING FACTORS: What makes it better or worse? (e.g., antacids, bending or twisting motion, bowel movement)     Bloating, ate 3 tums not effective  10. OTHER SYMPTOMS: Do you have any other symptoms? (e.g., back pain, diarrhea, fever, urination pain, vomiting)       More liquid stools noted. Abdominal bloating , only drank protein  drink this am and can't eat anything else.  11. PREGNANCY: Is there any chance you are pregnant? When was your last menstrual period?       na  Protocols used: Abdominal Pain - Female-A-AH

## 2023-12-18 ENCOUNTER — Inpatient Hospital Stay

## 2023-12-18 DIAGNOSIS — F411 Generalized anxiety disorder: Secondary | ICD-10-CM

## 2023-12-18 DIAGNOSIS — I10 Essential (primary) hypertension: Secondary | ICD-10-CM

## 2023-12-18 DIAGNOSIS — K56609 Unspecified intestinal obstruction, unspecified as to partial versus complete obstruction: Secondary | ICD-10-CM | POA: Diagnosis not present

## 2023-12-18 LAB — BASIC METABOLIC PANEL WITH GFR
Anion gap: 8 (ref 5–15)
BUN: 8 mg/dL (ref 6–20)
CO2: 29 mmol/L (ref 22–32)
Calcium: 9.7 mg/dL (ref 8.9–10.3)
Chloride: 103 mmol/L (ref 98–111)
Creatinine, Ser: 0.81 mg/dL (ref 0.44–1.00)
GFR, Estimated: >60 mL/min (ref >60)
Glucose, Bld: 119 mg/dL — ABNORMAL HIGH (ref 70–99)
Potassium: 3.7 mmol/L (ref 3.5–5.1)
Sodium: 140 mmol/L (ref 135–145)

## 2023-12-18 LAB — CBC
HCT: 39.6 % (ref 36.0–46.0)
Hemoglobin: 13.2 g/dL (ref 12.0–15.0)
MCH: 29.5 pg (ref 26.0–34.0)
MCHC: 33.3 g/dL (ref 30.0–36.0)
MCV: 88.6 fL (ref 80.0–100.0)
Platelets: 331 K/uL (ref 150–400)
RBC: 4.47 MIL/uL (ref 3.87–5.11)
RDW: 12.3 % (ref 11.5–15.5)
WBC: 11.7 K/uL — ABNORMAL HIGH (ref 4.0–10.5)
nRBC: 0 % (ref 0.0–0.2)

## 2023-12-18 LAB — HIV ANTIBODY (ROUTINE TESTING W REFLEX): HIV Screen 4th Generation wRfx: NONREACTIVE

## 2023-12-18 LAB — LACTIC ACID, PLASMA: Lactic Acid, Venous: 1 mmol/L (ref 0.5–1.9)

## 2023-12-18 MED ORDER — MENTHOL 3 MG MT LOZG
1.0000 | LOZENGE | OROMUCOSAL | Status: DC | PRN
  Administered 2023-12-18 – 2023-12-20 (×4): 3 mg via ORAL
  Filled 2023-12-18 (×2): qty 9

## 2023-12-18 MED ORDER — DIATRIZOATE MEGLUMINE & SODIUM 66-10 % PO SOLN
90.0000 mL | Freq: Once | ORAL | Status: AC
  Administered 2023-12-18: 90 mL via NASOGASTRIC

## 2023-12-18 NOTE — Assessment & Plan Note (Signed)
 Blood pressure mildly elevated. Patient takes losartan  and spironolactone  at home. - As needed metoprolol  while she is n.p.o.

## 2023-12-18 NOTE — Plan of Care (Signed)

## 2023-12-18 NOTE — Plan of Care (Signed)

## 2023-12-18 NOTE — Progress Notes (Signed)
  Progress Note   Patient: Dawn Griffith DOB: May 03, 1966 DOA: 12/17/2023     1 DOS: the patient was seen and examined on 12/18/2023   Brief hospital course: Taken from H&P.   Dawn Griffith is a 58 y.o. female with medical history significant of HTN and chronic constipation presenting to the emergency department for evaluation of mid epigastric abdominal pain that started acutely today.  She has associated nausea, vomiting, abdominal distention. She reports diarrhea on Friday and Saturday which has resolved, denies having a normal bowel movement since.  Patient has an history of laparoscopic cholecystectomy and C-sections.  On presentation she was tachycardic with rate of 117, otherwise hemodynamically stable, labs with leukocytosis of 16.5 and CT evidence of SBO.   Surgery was consulted and NGT was placed.  7/15:Vital stable, improving leukocytosis, at 11.7.  General surgery is planning Gastrografin  challenge today.  Started passing flatus but no bowel movement yet.  Assessment and Plan: * SBO (small bowel obstruction) (HCC) Pain improved, passing flatus with no bowel movement yet.  History of laparoscopic cholecystectomy and C-sections. General surgery is on board -Continue with NG tube -Surgery is going to do Gastrografin  studies today -Continue with conservative management-if failed then surgery will decide about surgical intervention -Continue with IV fluid -Continue with supportive care  Essential hypertension, benign Blood pressure mildly elevated. Patient takes losartan  and spironolactone  at home. - As needed metoprolol  while she is n.p.o.  Generalized anxiety disorder As needed Ativan  while she is n.p.o.   Subjective: Patient with no abdominal pain, nausea or vomiting.  Passing some flatus but no bowel movement yet.  Physical Exam: Vitals:   12/17/23 2216 12/17/23 2217 12/18/23 0237 12/18/23 0836  BP: (!) 154/103 (!) 150/97 (!) 132/93 (!) 143/87  Pulse:  92 93 86 91  Resp: 18  18 18   Temp: 98.4 F (36.9 C)  98.3 F (36.8 C) 98.5 F (36.9 C)  TempSrc: Oral  Oral Oral  SpO2: 93% 94% 96% 100%  Weight:      Height:       General.  Well-developed lady, in no acute distress.  NG tube in place Pulmonary.  Lungs clear bilaterally, normal respiratory effort. CV.  Regular rate and rhythm, no JVD, rub or murmur. Abdomen.  Soft, nontender, nondistended, BS positive. CNS.  Alert and oriented .  No focal neurologic deficit. Extremities.  No edema, pulses intact and symmetrical. Psychiatry.  Judgment and insight appears normal.   Data Reviewed: Prior data reviewed  Family Communication: Discussed with patient  Disposition: Status is: Inpatient Remains inpatient appropriate because: Severity of illness  Planned Discharge Destination: Home  DVT prophylaxis.  Lovenox  Time spent: 45 minutes  This record has been created using Conservation officer, historic buildings. Errors have been sought and corrected,but may not always be located. Such creation errors do not reflect on the standard of care.   Author: Amaryllis Dare, MD 12/18/2023 10:42 AM  For on call review [email or web address].

## 2023-12-18 NOTE — TOC CM/SW Note (Signed)
 Transition of Care [facility]) - Inpatient Brief Assessment   Patient Details  Name: Dawn Griffith MRN: [id number] Date of Birth: 07/16/1965  [facility] of [facility]) CM/SW Contact:    Corean ONEIDA Haddock, RN Phone Number: 12/18/2023, 4:01 PM   Clinical Narrative:   Transition of Care (TOC) Screening Note   Patient Details  Name: Dawn Griffith Date of Birth: 03/29/1966   Transition of Care [facility]) CM/SW Contact:    Corean ONEIDA Haddock, RN Phone Number: 12/18/2023, 4:01 PM    Transition of Care Department [facility]) has reviewed patient and no TOC needs have been identified at this time. . If new patient transition needs arise, please place a TOC consult.     Transition of Care Asessment: Insurance and Status: Insurance coverage has been reviewed Patient has primary care physician: Yes     Prior/Current Home Services: No current home services Social Drivers of Health Review: SDOH reviewed no interventions necessary Readmission risk has been reviewed: Yes Transition of care needs: no transition of care needs at this time

## 2023-12-18 NOTE — Consult Note (Signed)
 [facility]-General Surgery  SURGICAL CONSULTATION NOTE    HISTORY OF PRESENT ILLNESS (HPI):  58 y.o. female presented to [facility] ED yesterday for evaluation of midepigastric pain assosicated with nausea/vomiting. Patient states symptoms started in the morning. Does not recall experiencing symptoms like this before. Pain is non-radiating. Has not had a bowel movement for about three days. States she has been passing gas. Does not report any alleviating or aggravating factors. Has a history of constipation and was taking Linzess  but is now taking Colace and Lubiprostone . Has a prior surgical history of cholecystectomy and C-section.   In the ED, vital signs were stable. She was afebrile, normotensive with a BP of 102/85, tachycardiac with a HR of 117, RR of 18 and 98% in room air. Labs indicate leukocytosis with a WBC of 16.5 and normal Hbg of 14.3. Creatinine was 0.79, and electrolytes were stable. CT of abdomen and pelvis show mild gastric distention and small bowel dilatation transition zone seen in central portion of abdomen consistent with small bowel obstruction  NG tube was placed last night. Patient this morning states she is already feeling better and is feeling less distended.   Surgery is consulted by physician Dr. Jossie in this context for evaluation and management of small bowel obstruction.   PAST MEDICAL HISTORY (PMH):  Past Medical History:  Diagnosis Date   Anxiety 12/30/2010   Cancer [facility])    as a child   Chronic constipation    Family history of FAP (familial adenomatous polyposis) 07/07/2014   5 yr interval screening Last colonoscopy 2018   Fibroid uterus 07/07/2014   Noted during pelvic ultrasound in 2014 for fullness on exam    Goiter diffuse 03/05/2014   Repeat ultrasound of thyroid  is unchanged.     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    Prediabetes 01/02/2016   Vitamin D  deficiency 07/04/2016     PAST SURGICAL HISTORY [facility]):  Past Surgical History:  Procedure Laterality Date   CESAREAN SECTION  1992   CHOLECYSTECTOMY  Dec 2012   Sankar   COLONOSCOPY     ECTOPIC PREGNANCY SURGERY  1997   LYMPH NODE BIOPSY     NASAL SINUS SURGERY       MEDICATIONS:  Prior to Admission medications   Medication Sig Start Date End Date Taking? Authorizing Provider  ALPRAZolam  (XANAX ) 0.5 MG tablet TAKE 1 TABLET BY MOUTH TWICE A DAY AS NEEDED FOR SLEEP AND ANXIETY 10/10/22   Marylynn Verneita CROME, MD  amitriptyline  (ELAVIL ) 100 MG tablet TAKE 1 TABLET BY MOUTH AT BEDTIME 07/19/23   Tullo, Teresa L, MD  amoxicillin -clavulanate (AUGMENTIN ) 875-125 MG tablet Take 1 tablet by mouth 2 (two) times daily. 06/25/23   Narendra, Nischal, MD  Ascorbic Acid (VITAMIN C) 1000 MG tablet Take 1,000 mg by mouth daily.    [provider]  cetirizine (ZYRTEC) 10 MG tablet Take 10 mg by mouth daily.    [provider]  chlorpheniramine-HYDROcodone (TUSSIONEX) 10-8 MG/5ML Take 5 mLs by mouth every 12 (twelve) hours as needed for cough. 11/27/23   Tullo, Teresa L, MD  cholecalciferol (VITAMIN D ) 1000 units tablet Take 1,000 Units by mouth daily.    [provider]  cyclobenzaprine  (FLEXERIL ) 5 MG tablet Take 1 tablet (5 mg  total) by mouth 3 (three) times daily as needed for muscle spasms. Patient not taking: Reported on 12/22/2022 04/21/22   Marylynn Verneita CROME, MD  docusate sodium (COLACE) 100 MG capsule Take 100 mg by mouth 2 (two) times daily.    [provider]  Lactobacillus (PROBIOTIC ACIDOPHILUS PO) Take 1 tablet by mouth daily.    [provider]  losartan  (COZAAR ) 100 MG tablet TAKE 1 TABLET BY MOUTH DAILY 06/27/23   Tullo, Teresa L, MD  lubiprostone  (AMITIZA ) 24 MCG capsule TAKE 1 CAPSULE BY MOUTH TWICE A DAY WITH A MEAL 09/10/23   Tullo, Teresa L, MD  Multiple  Vitamin (MULTI-VITAMIN DAILY PO) Take by mouth.    [provider]  omeprazole  (PRILOSEC) 40 MG capsule TAKE 1 CAPSULE BY MOUTH DAILY 02/13/23   Marylynn Verneita CROME, MD  PARoxetine  (PAXIL -CR) 25 MG 24 hr tablet TAKE 1 TABLET BY MOUTH DAILY 10/15/23   Marylynn Verneita CROME, MD  rOPINIRole  (REQUIP ) 0.25 MG tablet Take 1 tablet (0.25 mg total) by mouth at bedtime. 03/13/23   Marylynn Verneita CROME, MD  spironolactone  (ALDACTONE ) 25 MG tablet TAKE 1 TABLET BY MOUTH DAILY 06/27/23   Tullo, Teresa L, MD  vitamin B-12 (CYANOCOBALAMIN) 1000 MCG tablet Take 1,000 mcg by mouth daily.    [provider]  zolpidem  (AMBIEN  CR) 12.5 MG CR tablet TAKE 1 TABLET BY MOUTH EVERY NIGHT AT BEDTIME AS NEEDED FOR SLEEP 10/25/23   Marylynn Verneita CROME, MD     ALLERGIES:  No Known Allergies   SOCIAL HISTORY:  Social History   Socioeconomic History   Marital status: Married    Spouse name: Omar   Number of children: 1   Years of education: 14   Highest education level: Not on file  Occupational History   Occupation: OFFICE Marine scientist: [employer]  Tobacco Use   Smoking status: Never   Smokeless tobacco: Never  Vaping Use   Vaping status: Never Used  Substance and Sexual Activity   Alcohol use: Yes    Comment: rare   Drug use: No   Sexual activity: Yes    Birth control/protection: Post-menopausal  Other Topics Concern   Not on file  Social History Narrative   Not on file   Social Drivers of Health   Financial Resource Strain: Patient Declined (06/25/2023)   Overall Financial Resource Strain (CARDIA)    Difficulty of Paying Living Expenses: Patient declined  Food Insecurity: No Food Insecurity (12/17/2023)   Hunger Vital Sign    Worried About Running Out of Food in the Last Year: Never true    Ran Out of Food in the Last Year: Never true  Transportation Needs: No Transportation Needs (12/17/2023)   PRAPARE - Administrator, Civil Service (Medical): No    Lack of  Transportation (Non-Medical): No  Physical Activity: Insufficiently Active (06/25/2023)   Exercise Vital Sign    Days of Exercise per Week: 1 day    Minutes of Exercise per Session: 30 min  Stress: Stress Concern Present (06/25/2023)   [employer] of Occupational Health - Occupational Stress Questionnaire    Feeling of Stress : To some extent  Social Connections: Unknown (06/25/2023)   Social Connection and Isolation Panel    Frequency of Communication with Friends and Family: Patient declined    Frequency of Social Gatherings with Friends and Family: Patient declined    Attends Religious Services: Patient declined    Active Member of Clubs or  Organizations: Patient declined    Attends Engineer, structural: Not on file    Marital Status: Married  Intimate Partner Violence: Not At Risk (12/17/2023)   Humiliation, Afraid, Rape, and Kick questionnaire    Fear of Current or Ex-Partner: No    Emotionally Abused: No    Physically Abused: No    Sexually Abused: No     FAMILY HISTORY:  Family History  Problem Relation Age of Onset   Arrhythmia Mother    Hypertension Mother    Colon polyps Mother        s/p colectomy    Kidney disease Mother        CKD Stage 4   Uterine cancer Mother    Diabetes Mother    Hypertension Father    Diabetes Father    Hypertension Brother    Breast cancer Neg Hx       REVIEW OF SYSTEMS:  Review of Systems  Constitutional:  Negative for chills and fever.  Respiratory:  Negative for shortness of breath and wheezing.   Cardiovascular:  Negative for chest pain and palpitations.  Gastrointestinal:  Positive for abdominal pain, constipation, nausea and vomiting.    VITAL SIGNS:  Temp:  [98.3 F (36.8 C)-98.6 F (37 C)] 98.5 F (36.9 C) (07/15 0836) Pulse Rate:  [86-117] 91 (07/15 0836) Resp:  [15-21] 18 (07/15 0836) BP: (102-154)/(85-103) 143/87 (07/15 0836) SpO2:  [93 %-100 %] 100 % (07/15 0836) Weight:  [81.6 kg] 81.6 kg (07/14  1648)     Height: 5' 9 (175.3 cm) Weight: 81.6 kg BMI (Calculated): 26.57   INTAKE/OUTPUT:  07/14 0701 - 07/15 0700 In: 754.1 [I.V.:754.1] Out: 1300 [Emesis/NG output:400]  PHYSICAL EXAM:  Physical Exam Constitutional:      Appearance: She is well-developed.  HENT:     Head: Normocephalic and atraumatic.  Eyes:     Extraocular Movements: Extraocular movements intact.     Pupils: Pupils are equal, round, and reactive to light.  Cardiovascular:     Rate and Rhythm: Normal rate and regular rhythm.  Pulmonary:     Effort: Pulmonary effort is normal.     Breath sounds: Normal breath sounds.  Abdominal:     Palpations: Abdomen is soft.     Tenderness: There is abdominal tenderness. There is no guarding or rebound.  Neurological:     Mental Status: She is alert.    Labs:     Latest Ref Rng & Units 12/18/2023    4:16 AM 12/17/2023    4:51 PM 03/12/2023    9:10 AM  CBC  WBC 4.0 - 10.5 K/uL 11.7  16.5  4.8   Hemoglobin 12.0 - 15.0 g/dL 86.7  85.6  86.6   Hematocrit 36.0 - 46.0 % 39.6  41.5  41.0   Platelets 150 - 400 K/uL 331  391  294.0       Latest Ref Rng & Units 12/18/2023    4:16 AM 12/17/2023    4:51 PM 03/12/2023    9:10 AM  CMP  Glucose 70 - 99 mg/dL 880  842  898   BUN 6 - 20 mg/dL 8  7  13    Creatinine 0.44 - 1.00 mg/dL 9.18  9.20  9.19   Sodium 135 - 145 mmol/L 140  138  139   Potassium 3.5 - 5.1 mmol/L 3.7  3.8  4.5   Chloride 98 - 111 mmol/L 103  102  104   CO2 22 - 32 mmol/L 29  23  27   Calcium 8.9 - 10.3 mg/dL 9.7  89.1  9.6   Total Protein 6.0 - 8.3 g/dL   6.9   Total Bilirubin 0.2 - 1.2 mg/dL   0.4   Alkaline Phos 39 - 117 U/L   65   AST 0 - 37 U/L   19   ALT 0 - 35 U/L   16     Imaging studies:  CLINICAL DATA:  Acute generalized abdominal pain.   EXAM: 07/14/025 CT ABDOMEN AND PELVIS WITH CONTRAST   TECHNIQUE: Multidetector CT imaging of the abdomen and pelvis was performed using the standard protocol following bolus administration  of intravenous contrast.   RADIATION DOSE REDUCTION: This exam was performed according to the departmental dose-optimization program which includes automated exposure control, adjustment of the mA and/or kV according to patient size and/or use of iterative reconstruction technique.   CONTRAST:  OMNIPAQUE  IOHEXOL  300 MG/ML  SOLN   COMPARISON:  None Available.   FINDINGS: Lower chest: No acute abnormality.   Hepatobiliary: No focal liver abnormality is seen. Status post cholecystectomy. No biliary dilatation.   Pancreas: Unremarkable. No pancreatic ductal dilatation or surrounding inflammatory changes.   Spleen: Normal in size without focal abnormality.   Adrenals/Urinary Tract: Adrenal glands are unremarkable. Kidneys are normal, without renal calculi, focal lesion, or hydronephrosis. Bladder is unremarkable.   Stomach/Bowel: Mild gastric distention is noted. Moderate proximal small bowel dilatation is noted with transition zone seen in central portion of abdomen best noted on image number 25 of series 5. No colonic dilatation is noted. The appendix is not clearly visualized, but no inflammation is noted in the right lower quadrant.   Vascular/Lymphatic: Aortic atherosclerosis. No enlarged abdominal or pelvic lymph nodes.   Reproductive: Uterus and bilateral adnexa are unremarkable.   Other: No ascites or hernia is noted.   Musculoskeletal: No acute or significant osseous findings.   IMPRESSION: Moderate small bowel dilatation is noted with transition zone seen in central portion of abdomen consistent with small-bowel obstruction. Potentially due to adhesion.   Aortic Atherosclerosis (ICD10-I70.0).     Electronically Signed   By: Lynwood Landy Raddle M.D.   On: 12/17/2023 18:24  CLINICAL DATA:  NG tube placement   EXAM: 12/18/23 PORTABLE ABDOMEN - 1 VIEW   COMPARISON:  12/17/2023, 9:20 p.m.   FINDINGS: Esophagogastric tube with tip and side port below the  diaphragm. Nonobstructive pattern of included bowel gas, general paucity thereof.   IMPRESSION: Esophagogastric tube with tip and side port below the diaphragm.     Electronically Signed   By: Marolyn JONETTA Jaksch M.D.   On: 12/18/2023 09:09   Assessment/Plan: 58 y.o. female with small bowel obstruction, complicated by pertinent comorbidities including essential hypertension, GERD, constipation, hyperlipidemia, and prediabetes. .   - No fever, not tachycardiac, with improving leukocytosis 16.5 >> 11.7   - Ordered Gastrografin , plan to give this morning and follow progress with abdominal x-ray in 8 hrs. If there is improvement in the dilation of small bowel, plan to clamp NG tube and start clear liquids once bowel function returns.   - Continue NPO  - Continue IV fluids and pain management    - DVT prophylaxis  Thank you for the opportunity to participate in this patient's care.   -- Gilmer Cea PA-C

## 2023-12-18 NOTE — Assessment & Plan Note (Signed)
 Pain improved, passing flatus with no bowel movement yet.  History of laparoscopic cholecystectomy and C-sections. General surgery is on board -Continue with NG tube -Surgery is going to do Gastrografin  studies today -Continue with conservative management-if failed then surgery will decide about surgical intervention -Continue with IV fluid -Continue with supportive care

## 2023-12-18 NOTE — Assessment & Plan Note (Signed)
 As needed Ativan  while she is n.p.o.

## 2023-12-18 NOTE — Hospital Course (Addendum)
 Taken from H&P.   Dawn Griffith is a 58 y.o. female with medical history significant of HTN and chronic constipation presenting to the emergency department for evaluation of mid epigastric abdominal pain that started acutely today.  She has associated nausea, vomiting, abdominal distention. She reports diarrhea on Friday and Saturday which has resolved, denies having a normal bowel movement since.  Patient has an history of laparoscopic cholecystectomy and C-sections.  On presentation she was tachycardic with rate of 117, otherwise hemodynamically stable, labs with leukocytosis of 16.5 and CT evidence of SBO.   Surgery was consulted and NGT was placed.  7/15:Vital stable, improving leukocytosis, at 11.7.  General surgery is planning Gastrografin  challenge today.  Started passing flatus but no bowel movement yet.  7/16: Hemodynamically stable, Gastrografin  studies with contrast in colon and nonobstructing pattern.  Passing flatus but still no BM.  NG tube was removed and patient was started on diet.  7/17: Patient remained hemodynamically stable.  Having bowel movement and tolerating diet.  No more pain, nausea or vomiting.  Patient will resume her home medications on discharge.  No new medications added.  She will follow-up with her providers for further assistance.

## 2023-12-19 DIAGNOSIS — I10 Essential (primary) hypertension: Secondary | ICD-10-CM | POA: Diagnosis not present

## 2023-12-19 DIAGNOSIS — K56609 Unspecified intestinal obstruction, unspecified as to partial versus complete obstruction: Secondary | ICD-10-CM | POA: Diagnosis not present

## 2023-12-19 DIAGNOSIS — F411 Generalized anxiety disorder: Secondary | ICD-10-CM | POA: Diagnosis not present

## 2023-12-19 MED ORDER — ACETAMINOPHEN 325 MG PO TABS
650.0000 mg | ORAL_TABLET | Freq: Four times a day (QID) | ORAL | Status: DC | PRN
  Administered 2023-12-19 – 2023-12-20 (×3): 650 mg via ORAL
  Filled 2023-12-19 (×3): qty 2

## 2023-12-19 MED ORDER — PAROXETINE HCL ER 12.5 MG PO TB24
25.0000 mg | ORAL_TABLET | Freq: Every day | ORAL | Status: DC
  Administered 2023-12-19 – 2023-12-20 (×2): 25 mg via ORAL
  Filled 2023-12-19 (×2): qty 2

## 2023-12-19 MED ORDER — LOSARTAN POTASSIUM 50 MG PO TABS
100.0000 mg | ORAL_TABLET | Freq: Every day | ORAL | Status: DC
  Administered 2023-12-19 – 2023-12-20 (×2): 100 mg via ORAL
  Filled 2023-12-19 (×2): qty 2

## 2023-12-19 MED ORDER — LUBIPROSTONE 24 MCG PO CAPS
24.0000 ug | ORAL_CAPSULE | Freq: Every day | ORAL | Status: DC
  Administered 2023-12-20: 24 ug via ORAL
  Filled 2023-12-19: qty 1

## 2023-12-19 MED ORDER — PANTOPRAZOLE SODIUM 40 MG PO TBEC
40.0000 mg | DELAYED_RELEASE_TABLET | Freq: Every day | ORAL | Status: DC
  Administered 2023-12-19 – 2023-12-20 (×2): 40 mg via ORAL
  Filled 2023-12-19 (×2): qty 1

## 2023-12-19 NOTE — Progress Notes (Signed)
 [facility]- General Surgery  SURGICAL PROGRESS NOTE  Hospital Day(s): 2.   Interval History:  Patient reports feeling good. Denies any abdominal pain, nausea or vomiting.  Serial imaging post gastrografin  shows contrast in descending colon with no obstructive pattern.  Vital signs in last 24 hours: [min-max] current  Temp:  [98.5 F (36.9 C)-98.7 F (37.1 C)] 98.7 F (37.1 C) (07/16 0422) Pulse Rate:  [91-96] 93 (07/16 0422) Resp:  [16-18] 16 (07/16 0422) BP: (128-150)/(78-93) 147/90 (07/16 0422) SpO2:  [94 %-100 %] 96 % (07/16 0422)     Height: 5' 9 (175.3 cm) Weight: 81.6 kg BMI (Calculated): 26.57   Intake/Output last 2 shifts:  07/15 0701 - 07/16 0700 In: 1119.8 [I.V.:1119.8] Out: 1650 [Emesis/NG output:1650]   Physical Exam:  Constitutional: alert, cooperative and no distress  Respiratory: breathing non-labored at rest  Cardiovascular: regular rate and sinus rhythm  Gastrointestinal: soft, non-tender, and non-distended  Labs:     Latest Ref Rng & Units 12/18/2023    4:16 AM 12/17/2023    4:51 PM 03/12/2023    9:10 AM  CBC  WBC 4.0 - 10.5 K/uL 11.7  16.5  4.8   Hemoglobin 12.0 - 15.0 g/dL 86.7  85.6  86.6   Hematocrit 36.0 - 46.0 % 39.6  41.5  41.0   Platelets 150 - 400 K/uL 331  391  294.0       Latest Ref Rng & Units 12/18/2023    4:16 AM 12/17/2023    4:51 PM 03/12/2023    9:10 AM  CMP  Glucose 70 - 99 mg/dL 880  842  898   BUN 6 - 20 mg/dL 8  7  13    Creatinine 0.44 - 1.00 mg/dL 9.18  9.20  9.19   Sodium 135 - 145 mmol/L 140  138  139   Potassium 3.5 - 5.1 mmol/L 3.7  3.8  4.5   Chloride 98 - 111 mmol/L 103  102  104   CO2 22 - 32 mmol/L 29  23  27    Calcium 8.9 - 10.3 mg/dL 9.7  89.1  9.6   Total Protein 6.0 - 8.3 g/dL   6.9   Total Bilirubin 0.2 - 1.2 mg/dL   0.4   Alkaline Phos 39 - 117 U/L   65   AST 0 - 37 U/L   19   ALT 0 - 35 U/L   16     Imaging studies:  CLINICAL DATA:  SBO 8 hour delay image   EXAM:12/18/23 PORTABLE ABDOMEN - 1  VIEW   COMPARISON:  None Available.   FINDINGS: PO contrast reaches the descending colon. The bowel gas pattern is normal. No radio-opaque calculi or other significant radiographic abnormality are seen.   IMPRESSION: 1. Nonobstructive bowel gas pattern. 2. PO contrast reaches the descending colon.     Electronically Signed   By: Morgane  Naveau M.D.   On: 12/18/2023 19:39  Assessment/Plan:  58 y.o. female with small bowel obstruction, Day 2, complicated by pertinent comorbidities including essential hypertension, GERD, constipation, hyperlipidemia, and prediabetes  .   - Clinical presentation and imaging correlate with resolution of SBO. Patient appears comfortable and denies any abdominal discomfort. Remove NGT and start clear liquid diet.   - Continue pain management and DVT prophylaxis   -- Rebacca Votaw Barrientos PA-C

## 2023-12-19 NOTE — Assessment & Plan Note (Signed)
 Blood pressure mildly elevated. - Restarting home losartan  -Continue to monitor

## 2023-12-19 NOTE — Assessment & Plan Note (Signed)
 Pain improved, passing flatus with no bowel movement yet.  History of laparoscopic cholecystectomy and C-sections. General surgery is on board Gastrografin  studies with contrast in colon and nonobstructing bowel pattern. - NG tube was removed and patient was started on diet-advance as tolerated -Continue supportive care

## 2023-12-19 NOTE — Progress Notes (Signed)
  Progress Note   Patient: Dawn Griffith FMW:[id number] DOB: 09/06/1965 DOA: 12/17/2023     2 DOS: the patient was seen and examined on 12/19/2023   Brief hospital course: Taken from H&P.   Dawn Griffith is a 58 y.o. female with medical history significant of HTN and chronic constipation presenting to the emergency department for evaluation of mid epigastric abdominal pain that started acutely today.  She has associated nausea, vomiting, abdominal distention. She reports diarrhea on Friday and Saturday which has resolved, denies having a normal bowel movement since.  Patient has an history of laparoscopic cholecystectomy and C-sections.  On presentation she was tachycardic with rate of 117, otherwise hemodynamically stable, labs with leukocytosis of 16.5 and CT evidence of SBO.   Surgery was consulted and NGT was placed.  7/15:Vital stable, improving leukocytosis, at 11.7.  General surgery is planning Gastrografin  challenge today.  Started passing flatus but no bowel movement yet.  7/16: Hemodynamically stable, Gastrografin  studies with contrast in colon and nonobstructing pattern.  Passing flatus but still no BM.  NG tube was removed and patient was started on diet.  Assessment and Plan: * SBO (small bowel obstruction) (HCC) Pain improved, passing flatus with no bowel movement yet.  History of laparoscopic cholecystectomy and C-sections. General surgery is on board Gastrografin  studies with contrast in colon and nonobstructing bowel pattern. - NG tube was removed and patient was started on diet-advance as tolerated -Continue supportive care  Essential hypertension, benign Blood pressure mildly elevated. - Restarting home losartan  -Continue to monitor  Generalized anxiety disorder - Restarting home meds.   Subjective: Patient is feeling much improved, no abdominal pain, nausea or vomiting.  NG tube was removed.  Passing flatus but no bowel movement yet.  Physical Exam: Vitals:    12/18/23 0836 12/18/23 1539 12/18/23 2002 12/19/23 0422  BP: (!) 143/87 (!) 150/93 128/78 (!) 147/90  Pulse: 91 96 94 93  Resp: 18 16 16 16   Temp: 98.5 F (36.9 C) 98.5 F (36.9 C) 98.7 F (37.1 C) 98.7 F (37.1 C)  TempSrc: Oral     SpO2: 100% 94% 94% 96%  Weight:      Height:       General.  Well-developed lady, in no acute distress. Pulmonary.  Lungs clear bilaterally, normal respiratory effort. CV.  Regular rate and rhythm, no JVD, rub or murmur. Abdomen.  Soft, nontender, nondistended, BS positive. CNS.  Alert and oriented .  No focal neurologic deficit. Extremities.  No edema, no cyanosis, pulses intact and symmetrical. Psychiatry.  Judgment and insight appears normal.    Data Reviewed: Prior data reviewed  Family Communication: Discussed with patient  Disposition: Status is: Inpatient Remains inpatient appropriate because: Severity of illness  Planned Discharge Destination: Home  DVT prophylaxis.  Lovenox  Time spent: 44 minutes  This record has been created using Conservation officer, historic buildings. Errors have been sought and corrected,but may not always be located. Such creation errors do not reflect on the standard of care.   Author: Amaryllis Dare, MD 12/19/2023 1:16 PM  For on call review [email or web address].

## 2023-12-19 NOTE — Plan of Care (Signed)

## 2023-12-19 NOTE — Assessment & Plan Note (Signed)
 Restarting home meds.

## 2023-12-20 DIAGNOSIS — F411 Generalized anxiety disorder: Secondary | ICD-10-CM | POA: Diagnosis not present

## 2023-12-20 DIAGNOSIS — R112 Nausea with vomiting, unspecified: Secondary | ICD-10-CM

## 2023-12-20 DIAGNOSIS — K56609 Unspecified intestinal obstruction, unspecified as to partial versus complete obstruction: Secondary | ICD-10-CM | POA: Diagnosis not present

## 2023-12-20 DIAGNOSIS — I10 Essential (primary) hypertension: Secondary | ICD-10-CM | POA: Diagnosis not present

## 2023-12-20 NOTE — Discharge Summary (Signed)
 Physician Discharge Summary   Patient: Dawn Griffith MRN: [id number] DOB: Mar 31, 1966  Admit date:     12/17/2023  Discharge date: 12/20/23  Discharge Physician: Amaryllis Dare   PCP: Marylynn Verneita CROME, MD   Recommendations at discharge:  Please obtain CBC and BMP and follow-up Follow-up with primary care provider Follow-up with general surgery  Discharge Diagnoses: Principal Problem:   SBO (small bowel obstruction) (HCC) Active Problems:   Essential hypertension, benign   Generalized anxiety disorder   Nausea and vomiting   Hospital Course: Taken from H&P.   Dawn Griffith is a 58 y.o. female with medical history significant of HTN and chronic constipation presenting to the emergency department for evaluation of mid epigastric abdominal pain that started acutely today.  She has associated nausea, vomiting, abdominal distention. She reports diarrhea on Friday and Saturday which has resolved, denies having a normal bowel movement since.  Patient has an history of laparoscopic cholecystectomy and C-sections.  On presentation she was tachycardic with rate of 117, otherwise hemodynamically stable, labs with leukocytosis of 16.5 and CT evidence of SBO.   Surgery was consulted and NGT was placed.  7/15:Vital stable, improving leukocytosis, at 11.7.  General surgery is planning Gastrografin  challenge today.  Started passing flatus but no bowel movement yet.  7/16: Hemodynamically stable, Gastrografin  studies with contrast in colon and nonobstructing pattern.  Passing flatus but still no BM.  NG tube was removed and patient was started on diet.  7/17: Patient remained hemodynamically stable.  Having bowel movement and tolerating diet.  No more pain, nausea or vomiting.  Patient will resume her home medications on discharge.  No new medications added.  She will follow-up with her providers for further assistance.  Assessment and Plan: * SBO (small bowel obstruction) (HCC) Pain improved,  passing flatus with no bowel movement yet.  History of laparoscopic cholecystectomy and C-sections. General surgery is on board Gastrografin  studies with contrast in colon and nonobstructing bowel pattern. - NG tube was removed and patient was started on diet-advance as tolerated - Symptoms resolved, tolerating diet and having bowel movement.  Essential hypertension, benign Blood pressure mildly elevated. - Restarting home losartan  -Continue to monitor  Generalized anxiety disorder - Restarting home meds.  Consultants: General Surgery Procedures performed: None Disposition: Home Diet recommendation:  Discharge Diet Orders (From admission, onward)     Start     Ordered   12/20/23 0000  Diet - low sodium heart healthy        12/20/23 1043           Cardiac diet DISCHARGE MEDICATION: Allergies as of 12/20/2023   No Known Allergies      Medication List     STOP taking these medications    amoxicillin -clavulanate 875-125 MG tablet Commonly known as: AUGMENTIN    cyclobenzaprine  5 MG tablet Commonly known as: FLEXERIL        TAKE these medications    ALPRAZolam  0.5 MG tablet Commonly known as: XANAX  TAKE 1 TABLET BY MOUTH TWICE A DAY AS NEEDED FOR SLEEP AND ANXIETY   amitriptyline  100 MG tablet Commonly known as: ELAVIL  TAKE 1 TABLET BY MOUTH AT BEDTIME   cetirizine 10 MG tablet Commonly known as: ZYRTEC Take 10 mg by mouth daily.   chlorpheniramine-HYDROcodone 10-8 MG/5ML Commonly known as: TUSSIONEX Take 5 mLs by mouth every 12 (twelve) hours as needed for cough.   cholecalciferol 1000 units tablet Commonly known as: VITAMIN D  Take 1,000 Units by mouth daily.   cyanocobalamin 1000  MCG tablet Commonly known as: VITAMIN B12 Take 1,000 mcg by mouth daily.   docusate sodium 100 MG capsule Commonly known as: COLACE Take 100 mg by mouth 2 (two) times daily.   losartan  100 MG tablet Commonly known as: COZAAR  TAKE 1 TABLET BY MOUTH DAILY    lubiprostone  24 MCG capsule Commonly known as: AMITIZA  TAKE 1 CAPSULE BY MOUTH TWICE A DAY WITH A MEAL   MULTI-VITAMIN DAILY PO Take by mouth.   omeprazole  40 MG capsule Commonly known as: PRILOSEC TAKE 1 CAPSULE BY MOUTH DAILY   PARoxetine  25 MG 24 hr tablet Commonly known as: PAXIL -CR TAKE 1 TABLET BY MOUTH DAILY   PROBIOTIC ACIDOPHILUS PO Take 1 tablet by mouth daily.   rOPINIRole  0.25 MG tablet Commonly known as: REQUIP  Take 1 tablet (0.25 mg total) by mouth at bedtime.   spironolactone  25 MG tablet Commonly known as: ALDACTONE  TAKE 1 TABLET BY MOUTH DAILY   vitamin C 1000 MG tablet Take 1,000 mg by mouth daily.   zolpidem  12.5 MG CR tablet Commonly known as: AMBIEN  CR TAKE 1 TABLET BY MOUTH EVERY NIGHT AT BEDTIME AS NEEDED FOR SLEEP        Follow-up Information     Marylynn Verneita CROME, MD. Schedule an appointment as soon as possible for a visit in 1 week(s).   Specialty: Internal Medicine Why: Follow up appt with Dr. Lucretia Monday December 24, 2023 at 1:40 pm. Contact information: [address] [place] [address] [phone]                Discharge Exam: Fredricka Weights   12/17/23 1648  Weight: 81.6 kg   General.  Well-developed lady, in no acute distress. Pulmonary.  Lungs clear bilaterally, normal respiratory effort. CV.  Regular rate and rhythm, no JVD, rub or murmur. Abdomen.  Soft, nontender, nondistended, BS positive. CNS.  Alert and oriented .  No focal neurologic deficit. Extremities.  No edema, no cyanosis, pulses intact and symmetrical. Psychiatry.  Judgment and insight appears normal.   Condition at discharge: stable  The results of significant diagnostics from this hospitalization (including imaging, microbiology, ancillary and laboratory) are listed below for reference.   Imaging Studies: DG Abd Portable 1V-Small Bowel Obstruction Protocol-initial, 8 hr delay Result Date: 12/18/2023 CLINICAL DATA:  SBO 8 hour  delay image EXAM: PORTABLE ABDOMEN - 1 VIEW COMPARISON:  None Available. FINDINGS: PO contrast reaches the descending colon. The bowel gas pattern is normal. No radio-opaque calculi or other significant radiographic abnormality are seen. IMPRESSION: 1. Nonobstructive bowel gas pattern. 2. PO contrast reaches the descending colon. Electronically Signed   By: Morgane  Naveau M.D.   On: 12/18/2023 19:39   DG Abd Portable 1V-Small Bowel Protocol-Position Verification Result Date: 12/18/2023 CLINICAL DATA:  NG tube placement EXAM: PORTABLE ABDOMEN - 1 VIEW COMPARISON:  12/17/2023, 9:20 p.m. FINDINGS: Esophagogastric tube with tip and side port below the diaphragm. Nonobstructive pattern of included bowel gas, general paucity thereof. IMPRESSION: Esophagogastric tube with tip and side port below the diaphragm. Electronically Signed   By: Marolyn JONETTA Jaksch M.D.   On: 12/18/2023 09:09   DG Abd Portable 1 View Result Date: 12/17/2023 CLINICAL DATA:  Repositioning of NG tube EXAM: PORTABLE ABDOMEN - 1 VIEW COMPARISON:  Same day abdominal radiographs FINDINGS: Enteric tube tip and side-port in the stomach. IMPRESSION: Enteric tube tip and side-port in the stomach. Electronically Signed   By: Norman Gatlin M.D.   On: 12/17/2023 21:27   DG Abd  Portable 1 View Result Date: 12/17/2023 CLINICAL DATA:  post ng insertion EXAM: PORTABLE ABDOMEN - 1 VIEW COMPARISON:  December 17, 2023 CT of the abdomen and pelvis FINDINGS: Esophagogastric tube has been placed in the interim, terminating at the GE junction. Distended stomach with an air-fluid level present. A single dilated segment of bowel noted in the right hemiabdomen. No pneumoperitoneum. No organomegaly or radiopaque calculi. Cholecystectomy clips. Excreted contrast in the renal collecting systems bilaterally. The lung bases are clear. IMPRESSION: Esophagogastric tube terminates at the GE junction. 8 cm of advancement is recommended for optimal positioning. Electronically Signed    By: Rogelia Myers M.D.   On: 12/17/2023 20:39   CT ABDOMEN PELVIS W CONTRAST Result Date: 12/17/2023 CLINICAL DATA:  Acute generalized abdominal pain. EXAM: CT ABDOMEN AND PELVIS WITH CONTRAST TECHNIQUE: Multidetector CT imaging of the abdomen and pelvis was performed using the standard protocol following bolus administration of intravenous contrast. RADIATION DOSE REDUCTION: This exam was performed according to the departmental dose-optimization program which includes automated exposure control, adjustment of the mA and/or kV according to patient size and/or use of iterative reconstruction technique. CONTRAST:  OMNIPAQUE  IOHEXOL  300 MG/ML  SOLN COMPARISON:  None Available. FINDINGS: Lower chest: No acute abnormality. Hepatobiliary: No focal liver abnormality is seen. Status post cholecystectomy. No biliary dilatation. Pancreas: Unremarkable. No pancreatic ductal dilatation or surrounding inflammatory changes. Spleen: Normal in size without focal abnormality. Adrenals/Urinary Tract: Adrenal glands are unremarkable. Kidneys are normal, without renal calculi, focal lesion, or hydronephrosis. Bladder is unremarkable. Stomach/Bowel: Mild gastric distention is noted. Moderate proximal small bowel dilatation is noted with transition zone seen in central portion of abdomen best noted on image number 25 of series 5. No colonic dilatation is noted. The appendix is not clearly visualized, but no inflammation is noted in the right lower quadrant. Vascular/Lymphatic: Aortic atherosclerosis. No enlarged abdominal or pelvic lymph nodes. Reproductive: Uterus and bilateral adnexa are unremarkable. Other: No ascites or hernia is noted. Musculoskeletal: No acute or significant osseous findings. IMPRESSION: Moderate small bowel dilatation is noted with transition zone seen in central portion of abdomen consistent with small-bowel obstruction. Potentially due to adhesion. Aortic Atherosclerosis (ICD10-I70.0).  Electronically Signed   By: Lynwood Landy Raddle M.D.   On: 12/17/2023 18:24   DG Chest 2 View Result Date: 12/17/2023 CLINICAL DATA:  cp EXAM: CHEST - 2 VIEW COMPARISON:  None available. FINDINGS: Low lung volumes. Streaky bibasilar atelectasis. No focal airspace consolidation, pleural effusion, or pneumothorax. No cardiomegaly. Tortuous aorta with aortic atherosclerosis. No acute fracture or destructive lesions. Multilevel thoracic osteophytosis. IMPRESSION: Low lung volumes with streaky bibasilar atelectasis. Electronically Signed   By: Rogelia Myers M.D.   On: 12/17/2023 17:24    Microbiology: Results for orders placed or performed in visit on 02/17/19  Novel Coronavirus, NAA ([facility])     Status: None   Collection Time: 02/17/19 12:00 AM   Specimen: Oropharyngeal(OP) collection in vial transport medium   OROPHARYNGEA  TESTING  Result Value Ref Range Status   SARS-CoV-2, NAA Not Detected Not Detected Final    Comment: Testing was performed using the cobas(R) SARS-CoV-2 test. This nucleic acid amplification test was developed and its performance characteristics determined by [employer]. Nucleic acid amplification tests include PCR and TMA. This test has not been FDA cleared or approved. This test has been authorized by FDA under an Emergency Use Authorization (EUA). This test is only authorized for the duration of time the declaration that circumstances exist justifying the authorization of  the emergency use of in vitro diagnostic tests for detection of SARS-CoV-2 virus and/or diagnosis of COVID-19 infection under section 564(b)(1) of the Act, 21 U.S.C. 639aaa-6(a) (1), unless the authorization is terminated or revoked sooner. When diagnostic testing is negative, the possibility of a false negative result should be considered in the context of a patient's recent exposures and the presence of clinical signs and symptoms consistent with COVID-19. An individual without symptoms   of COVID-19 and who is not shedding SARS-CoV-2 virus would expect to have a negative (not detected) result in this assay.     Labs: CBC: Recent Labs  Lab 12/17/23 1651 12/18/23 0416  WBC 16.5* 11.7*  HGB 14.3 13.2  HCT 41.5 39.6  MCV 86.8 88.6  PLT 391 331   Basic Metabolic Panel: Recent Labs  Lab 12/17/23 1651 12/18/23 0416  NA 138 140  K 3.8 3.7  CL 102 103  CO2 23 29  GLUCOSE 157* 119*  BUN 7 8  CREATININE 0.79 0.81  CALCIUM 10.8* 9.7   Liver Function Tests: No results for input(s): AST, ALT, ALKPHOS, BILITOT, PROT, ALBUMIN in the last 168 hours. CBG: No results for input(s): GLUCAP in the last 168 hours.  Discharge time spent: greater than 30 minutes.  This record has been created using Conservation officer, historic buildings. Errors have been sought and corrected,but may not always be located. Such creation errors do not reflect on the standard of care.   Signed: Amaryllis Dare, MD [facility] 12/20/2023

## 2023-12-20 NOTE — Progress Notes (Signed)
 [facility]- General Surgery  SURGICAL PROGRESS NOTE  Hospital Day(s): 3.  Interval History:  Patient reports feeling good. Has been tolerating full liquids with no issue. Reports having another bowel movement this morning. Denies any nausea or vomiting.    Vital signs in last 24 hours: [min-max] current  Temp:  [98.1 F (36.7 C)-98.6 F (37 C)] 98.6 F (37 C) (07/17 0738) Pulse Rate:  [82-98] 82 (07/17 0738) Resp:  [16] 16 (07/17 0738) BP: (125-140)/(67-82) 140/82 (07/17 0738) SpO2:  [96 %-98 %] 96 % (07/17 0738)     Height: 5' 9 (175.3 cm) Weight: 81.6 kg BMI (Calculated): 26.57   Intake/Output last 2 shifts:  07/16 0701 - 07/17 0700 In: 120 [P.O.:120] Out: -    Physical Exam:  Constitutional: alert, cooperative and no distress  Respiratory: breathing non-labored at rest  Cardiovascular: regular rate and sinus rhythm  Gastrointestinal: soft, non-tender, and non-distended  Labs:     Latest Ref Rng & Units 12/18/2023    4:16 AM 12/17/2023    4:51 PM 03/12/2023    9:10 AM  CBC  WBC 4.0 - 10.5 K/uL 11.7  16.5  4.8   Hemoglobin 12.0 - 15.0 g/dL 86.7  85.6  86.6   Hematocrit 36.0 - 46.0 % 39.6  41.5  41.0   Platelets 150 - 400 K/uL 331  391  294.0       Latest Ref Rng & Units 12/18/2023    4:16 AM 12/17/2023    4:51 PM 03/12/2023    9:10 AM  CMP  Glucose 70 - 99 mg/dL 880  842  898   BUN 6 - 20 mg/dL 8  7  13    Creatinine 0.44 - 1.00 mg/dL 9.18  9.20  9.19   Sodium 135 - 145 mmol/L 140  138  139   Potassium 3.5 - 5.1 mmol/L 3.7  3.8  4.5   Chloride 98 - 111 mmol/L 103  102  104   CO2 22 - 32 mmol/L 29  23  27    Calcium 8.9 - 10.3 mg/dL 9.7  89.1  9.6   Total Protein 6.0 - 8.3 g/dL   6.9   Total Bilirubin 0.2 - 1.2 mg/dL   0.4   Alkaline Phos 39 - 117 U/L   65   AST 0 - 37 U/L   19   ALT 0 - 35 U/L   16     Imaging studies: No new pertinent imaging studies   Assessment/Plan:  58 y.o. female with small bowel obstruction Day 3, complicated by pertinent  comorbidities including essential hypertension, GERD, constipation, hyperlipidemia, and prediabetes.   Advanced to soft diet this morning after tolerating full liquids. Patient tolerated soft diet and is having regular bowel movements. Physical exam findings suggest resolution of SBO. Patient is clear from surgical standpoint.  -- Gilmer Cea PA-C

## 2023-12-21 ENCOUNTER — Telehealth: Payer: Self-pay

## 2023-12-21 NOTE — Transitions of Care (Post Inpatient/ED Visit) (Signed)
 12/21/2023  Name: Dawn Griffith MRN: [id number] DOB: 1966-04-18  Today's TOC FU Call Status: Today's TOC FU Call Status:: Successful TOC FU Call Completed TOC FU Call Complete Date: 12/21/23 Patient's Name and Date of Birth confirmed.  Transition Care Management Follow-up Telephone Call Date of Discharge: 12/20/23 Discharge Facility: [facility]) Type of Discharge: Inpatient Admission Primary Inpatient Discharge Diagnosis:: intestional obstruction How have you been since you were released from the hospital?: Better Any questions or concerns?: No  Items Reviewed: Did you receive and understand the discharge instructions provided?: Yes Medications obtained,verified, and reconciled?: Yes (Medications Reviewed) Any new allergies since your discharge?: No Dietary orders reviewed?: Yes Do you have support at home?: Yes People in Home [RPT]: spouse  Medications Reviewed Today: Medications Reviewed Today     Reviewed by Emmitt Pan, LPN (Licensed Practical Nurse) on 12/21/23 at [phone]  Med List Status: <None>   Medication Order Taking? Sig Documenting Provider Last Dose Status Informant  ALPRAZolam  (XANAX ) 0.5 MG tablet [id number] Yes TAKE 1 TABLET BY MOUTH TWICE A DAY AS NEEDED FOR SLEEP AND ANXIETY Tullo, Teresa L, MD  Active   amitriptyline  (ELAVIL ) 100 MG tablet [id number] Yes TAKE 1 TABLET BY MOUTH AT BEDTIME Tullo, Teresa L, MD  Active   Ascorbic Acid (VITAMIN C) 1000 MG tablet [id number] Yes Take 1,000 mg by mouth daily. [provider]  Active   cetirizine (ZYRTEC) 10 MG tablet [id number] Yes Take 10 mg by mouth daily. [provider]  Active   chlorpheniramine-HYDROcodone (TUSSIONEX) 10-8 MG/5ML [id number] Yes Take 5 mLs by mouth every 12 (twelve) hours as needed for cough. Marylynn Verneita CROME, MD  Active   cholecalciferol (VITAMIN D ) 1000 units tablet [id number] Yes Take 1,000 Units by mouth daily. [provider]  Active Self   docusate sodium (COLACE) 100 MG capsule [id number] Yes Take 100 mg by mouth 2 (two) times daily. [provider]  Active Self  Lactobacillus (PROBIOTIC ACIDOPHILUS PO) [id number] Yes Take 1 tablet by mouth daily. [provider]  Active   losartan  (COZAAR ) 100 MG tablet [id number] Yes TAKE 1 TABLET BY MOUTH DAILY Tullo, Teresa L, MD  Active   lubiprostone  (AMITIZA ) 24 MCG capsule [id number] Yes TAKE 1 CAPSULE BY MOUTH TWICE A DAY WITH A MEAL Tullo, Teresa L, MD  Active   Multiple Vitamin (MULTI-VITAMIN DAILY PO) [id number] Yes Take by mouth. [provider]  Active   omeprazole  (PRILOSEC) 40 MG capsule [id number] Yes TAKE 1 CAPSULE BY MOUTH DAILY Tullo, Teresa L, MD  Active   PARoxetine  (PAXIL -CR) 25 MG 24 hr tablet [id number] Yes TAKE 1 TABLET BY MOUTH DAILY Tullo, Teresa L, MD  Active   rOPINIRole  (REQUIP ) 0.25 MG tablet [id number] Yes Take 1 tablet (0.25 mg total) by mouth at bedtime. Marylynn Verneita CROME, MD  Active   spironolactone  (ALDACTONE ) 25 MG tablet [id number] Yes TAKE 1 TABLET BY MOUTH DAILY Tullo, Teresa L, MD  Active   vitamin B-12 (CYANOCOBALAMIN) 1000 MCG tablet [id number] Yes Take 1,000 mcg by mouth daily. [provider]  Active   zolpidem  (AMBIEN  CR) 12.5 MG CR tablet [id number] Yes TAKE 1 TABLET BY MOUTH EVERY NIGHT AT BEDTIME AS NEEDED FOR SLEEP Marylynn Verneita CROME, MD  Active             Home Care and Equipment/Supplies: Were Home Health Services Ordered?: NA Any new equipment or medical supplies ordered?: NA  Functional Questionnaire: Do you need assistance with bathing/showering or dressing?: No  Do you need assistance with meal preparation?: No Do you need assistance with eating?: No Do you have difficulty maintaining continence: No Do you need assistance with getting out of bed/getting out of a chair/moving?: No Do you have difficulty managing or taking your medications?: No  Follow up appointments reviewed: PCP Follow-up appointment  confirmed?: Yes Date of PCP follow-up appointment?: 01/16/24 Follow-up Provider: [facility] Follow-up appointment confirmed?: NA Do you need transportation to your follow-up appointment?: No Do you understand care options if your condition(s) worsen?: Yes-patient verbalized understanding Patient already schedule, and doesn't want to change appt   SIGNATURE Julian Lemmings, LPN [facility] Nurse Health Advisor Direct Dial [phone]

## 2023-12-24 ENCOUNTER — Inpatient Hospital Stay: Admitting: Internal Medicine

## 2023-12-28 ENCOUNTER — Other Ambulatory Visit: Payer: Self-pay | Admitting: Internal Medicine

## 2024-01-02 ENCOUNTER — Ambulatory Visit: Admitting: Internal Medicine

## 2024-01-02 ENCOUNTER — Encounter: Payer: Self-pay | Admitting: Internal Medicine

## 2024-01-02 VITALS — BP 114/72 | HR 97 | Ht 69.0 in | Wt 183.0 lb

## 2024-01-02 DIAGNOSIS — Z8719 Personal history of other diseases of the digestive system: Secondary | ICD-10-CM

## 2024-01-02 DIAGNOSIS — Z09 Encounter for follow-up examination after completed treatment for conditions other than malignant neoplasm: Secondary | ICD-10-CM | POA: Diagnosis not present

## 2024-01-02 DIAGNOSIS — F411 Generalized anxiety disorder: Secondary | ICD-10-CM

## 2024-01-02 MED ORDER — ALPRAZOLAM 0.5 MG PO TABS: 0.5000 mg | ORAL_TABLET | Freq: Every day | ORAL | 2 refills | Status: DC | PRN

## 2024-01-02 MED ORDER — AMITRIPTYLINE HCL 100 MG PO TABS: 100.0000 mg | ORAL_TABLET | Freq: Every day | ORAL | 1 refills | Status: DC

## 2024-01-02 MED ORDER — LUBIPROSTONE 24 MCG PO CAPS: ORAL_CAPSULE | ORAL | 1 refills | Status: DC
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
## 2024-01-02 NOTE — Assessment & Plan Note (Signed)
Patient is stable post discharge and has no new issues or questions about discharge plans at the visit today for hospital follow up. All labs , imaging studies and progress notes from admission were reviewed with patient today   

## 2024-01-02 NOTE — Assessment & Plan Note (Signed)
 Secondary to change in diet and history of multiple abdominal surgeries

## 2024-01-02 NOTE — Assessment & Plan Note (Signed)
 With panic attacks occurring several times in the past week due to  family stressors.  Refilling alprazolam  .  The risks and benefits of  Chronic  benzodiazepine use were reviewed with patient today including increased risk of dementia,  Addiction, and seizures if abruptly withdrawn  .

## 2024-01-16 ENCOUNTER — Inpatient Hospital Stay: Admitting: Internal Medicine

## 2024-02-17 ENCOUNTER — Other Ambulatory Visit: Payer: Self-pay | Admitting: Internal Medicine

## 2024-03-23 ENCOUNTER — Other Ambulatory Visit: Payer: Self-pay | Admitting: Internal Medicine

## 2024-04-08 ENCOUNTER — Telehealth: Payer: Self-pay

## 2024-04-08 ENCOUNTER — Other Ambulatory Visit: Payer: Self-pay | Admitting: Internal Medicine

## 2024-04-08 DIAGNOSIS — Z1231 Encounter for screening mammogram for malignant neoplasm of breast: Secondary | ICD-10-CM

## 2024-04-08 NOTE — Telephone Encounter (Signed)
 Copied from CRM [id number]. Topic: Appointments - Appointment Scheduling >> Apr 08, 2024  1:43 PM Rea ORN wrote: Patient is calling to schedule an appointment CPE that needs to be done before the end of the year. Soonest available appt is Jan 2026. Please call back, [phone], to advise if there is sooner availability.  I spoke with patient and scheduled an appointment for her to have her physical on 04/30/2024 with Dr. Verneita Kettering.

## 2024-04-08 NOTE — Telephone Encounter (Signed)
 noted

## 2024-04-17 ENCOUNTER — Other Ambulatory Visit: Payer: Self-pay | Admitting: Internal Medicine

## 2024-04-24 DIAGNOSIS — K5904 Chronic idiopathic constipation: Secondary | ICD-10-CM | POA: Diagnosis not present

## 2024-04-24 DIAGNOSIS — Z800 Family history of malignant neoplasm of digestive organs: Secondary | ICD-10-CM | POA: Diagnosis not present

## 2024-04-24 DIAGNOSIS — Z8719 Personal history of other diseases of the digestive system: Secondary | ICD-10-CM | POA: Diagnosis not present

## 2024-04-24 DIAGNOSIS — K921 Melena: Secondary | ICD-10-CM | POA: Diagnosis not present

## 2024-04-30 ENCOUNTER — Encounter: Payer: Self-pay | Admitting: Internal Medicine

## 2024-04-30 ENCOUNTER — Other Ambulatory Visit (HOSPITAL_COMMUNITY)
Admission: RE | Admit: 2024-04-30 | Discharge: 2024-04-30 | Disposition: A | Source: Ambulatory Visit | Attending: Internal Medicine | Admitting: Internal Medicine

## 2024-04-30 ENCOUNTER — Ambulatory Visit (INDEPENDENT_AMBULATORY_CARE_PROVIDER_SITE_OTHER): Admitting: Internal Medicine

## 2024-04-30 VITALS — BP 116/80 | HR 90 | Ht 69.0 in | Wt 188.6 lb

## 2024-04-30 DIAGNOSIS — I700 Atherosclerosis of aorta: Secondary | ICD-10-CM | POA: Diagnosis not present

## 2024-04-30 DIAGNOSIS — Z8719 Personal history of other diseases of the digestive system: Secondary | ICD-10-CM | POA: Diagnosis not present

## 2024-04-30 DIAGNOSIS — E785 Hyperlipidemia, unspecified: Secondary | ICD-10-CM

## 2024-04-30 DIAGNOSIS — I10 Essential (primary) hypertension: Secondary | ICD-10-CM

## 2024-04-30 DIAGNOSIS — F411 Generalized anxiety disorder: Secondary | ICD-10-CM | POA: Diagnosis not present

## 2024-04-30 DIAGNOSIS — K921 Melena: Secondary | ICD-10-CM | POA: Diagnosis not present

## 2024-04-30 DIAGNOSIS — Z0000 Encounter for general adult medical examination without abnormal findings: Secondary | ICD-10-CM | POA: Diagnosis not present

## 2024-04-30 DIAGNOSIS — R5383 Other fatigue: Secondary | ICD-10-CM

## 2024-04-30 DIAGNOSIS — K5909 Other constipation: Secondary | ICD-10-CM

## 2024-04-30 DIAGNOSIS — R7303 Prediabetes: Secondary | ICD-10-CM

## 2024-04-30 DIAGNOSIS — Z23 Encounter for immunization: Secondary | ICD-10-CM | POA: Diagnosis not present

## 2024-04-30 DIAGNOSIS — Z124 Encounter for screening for malignant neoplasm of cervix: Secondary | ICD-10-CM | POA: Insufficient documentation

## 2024-04-30 DIAGNOSIS — E010 Iodine-deficiency related diffuse (endemic) goiter: Secondary | ICD-10-CM | POA: Diagnosis not present

## 2024-04-30 LAB — COMPREHENSIVE METABOLIC PANEL WITH GFR
ALT: 21 U/L (ref 0–35)
AST: 20 U/L (ref 0–37)
Albumin: 4.7 g/dL (ref 3.5–5.2)
Alkaline Phosphatase: 72 U/L (ref 39–117)
BUN: 13 mg/dL (ref 6–23)
CO2: 28 meq/L (ref 19–32)
Calcium: 9.7 mg/dL (ref 8.4–10.5)
Chloride: 102 meq/L (ref 96–112)
Creatinine, Ser: 0.8 mg/dL (ref 0.40–1.20)
GFR: 80.07 mL/min (ref >60.00)
Glucose, Bld: 94 mg/dL (ref 70–99)
Potassium: 4.4 meq/L (ref 3.5–5.1)
Sodium: 139 meq/L (ref 135–145)
Total Bilirubin: 0.7 mg/dL (ref 0.2–1.2)
Total Protein: 7.3 g/dL (ref 6.0–8.3)

## 2024-04-30 LAB — CBC WITH DIFFERENTIAL/PLATELET
Basophils Absolute: 0 K/uL (ref 0.0–0.1)
Basophils Relative: 0.6 % (ref 0.0–3.0)
Eosinophils Absolute: 0.1 K/uL (ref 0.0–0.7)
Eosinophils Relative: 1.9 % (ref 0.0–5.0)
HCT: 38.8 % (ref 36.0–46.0)
Hemoglobin: 12.9 g/dL (ref 12.0–15.0)
Lymphocytes Relative: 32.9 % (ref 12.0–46.0)
Lymphs Abs: 2.2 K/uL (ref 0.7–4.0)
MCHC: 33.4 g/dL (ref 30.0–36.0)
MCV: 89.8 fl (ref 78.0–100.0)
Monocytes Absolute: 0.4 K/uL (ref 0.1–1.0)
Monocytes Relative: 6.6 % (ref 3.0–12.0)
Neutro Abs: 3.9 K/uL (ref 1.4–7.7)
Neutrophils Relative %: 58 % (ref 43.0–77.0)
Platelets: 284 K/uL (ref 150.0–400.0)
RBC: 4.32 Mil/uL (ref 3.87–5.11)
RDW: 13 % (ref 11.5–15.5)
WBC: 6.8 K/uL (ref 4.0–10.5)

## 2024-04-30 LAB — LIPID PANEL
Cholesterol: 232 mg/dL — ABNORMAL HIGH (ref 0–200)
HDL: 58 mg/dL (ref >39.00)
LDL Cholesterol: 151 mg/dL — ABNORMAL HIGH (ref 0–99)
NonHDL: 173.53
Total CHOL/HDL Ratio: 4
Triglycerides: 113 mg/dL (ref 0.0–149.0)
VLDL: 22.6 mg/dL (ref 0.0–40.0)

## 2024-04-30 LAB — HEMOGLOBIN A1C: Hgb A1c MFr Bld: 5.9 % (ref 4.6–6.5)

## 2024-04-30 LAB — IBC + FERRITIN
Ferritin: 20.7 ng/mL (ref 10.0–291.0)
Iron: 144 ug/dL (ref 42–145)
Saturation Ratios: 30.7 % (ref 20.0–50.0)
TIBC: 469 ug/dL — ABNORMAL HIGH (ref 250.0–450.0)
Transferrin: 335 mg/dL (ref 212.0–360.0)

## 2024-04-30 LAB — LDL CHOLESTEROL, DIRECT: Direct LDL: 167 mg/dL

## 2024-04-30 LAB — MICROALBUMIN / CREATININE URINE RATIO
Creatinine,U: 21 mg/dL
Microalb Creat Ratio: UNDETERMINED mg/g (ref 0.0–30.0)
Microalb, Ur: <0.7 mg/dL

## 2024-04-30 LAB — TSH: TSH: 0.81 u[IU]/mL (ref 0.35–5.50)

## 2024-04-30 MED ORDER — PAROXETINE HCL ER 37.5 MG PO TB24: 37.5000 mg | ORAL_TABLET | Freq: Every day | ORAL | 5 refills | Status: AC

## 2024-04-30 MED ORDER — AMITRIPTYLINE HCL 100 MG PO TABS
100.0000 mg | ORAL_TABLET | Freq: Every day | ORAL | 1 refills | Status: AC
Start: 2024-04-30 — End: ?

## 2024-04-30 MED ORDER — ALPRAZOLAM 0.5 MG PO TABS: 0.5000 mg | ORAL_TABLET | Freq: Every day | ORAL | 2 refills | Status: AC | PRN

## 2024-04-30 MED ORDER — SPIRONOLACTONE 25 MG PO TABS: 25.0000 mg | ORAL_TABLET | Freq: Every day | ORAL | 2 refills | Status: AC

## 2024-04-30 NOTE — Patient Instructions (Signed)
 I have increased the paxil  dose to 37.5 mg daily and refilled your alprazolam 

## 2024-04-30 NOTE — Progress Notes (Signed)
 Patient ID: Dawn Griffith, female    DOB: Nov 30, 1965  Age: 58 y.o. MRN: [id number]  The patient is here for annual preventive examination and management of other chronic and acute problems.   The risk factors are reflected in the social history.   The roster of all physicians providing medical care to patient - is listed in the Snapshot section of the chart.   Activities of daily living:  The patient is 100% independent in all ADLs: dressing, toileting, feeding as well as independent mobility   Home safety : The patient has smoke detectors in the home. They wear seatbelts.  There are no unsecured firearms at home. There is no violence in the home.    There is no risks for hepatitis, STDs or HIV. There is no   history of blood transfusion. They have no travel history to infectious disease endemic areas of the world.   The patient has seen their dentist in the last six month. They have seen their eye doctor in the last year. The patinet  denies slight hearing difficulty with regard to whispered voices and some television programs.  They have deferred audiologic testing in the last year.  They do not  have excessive sun exposure. Discussed the need for sun protection: hats, long sleeves and use of sunscreen if there is significant sun exposure.    Diet: the importance of a healthy diet is discussed. They do have a healthy diet.   The benefits of regular aerobic exercise were discussed. The patient  exercises  3 to 5 days per week  for  60 minutes.    Depression screen: there are no signs or vegative symptoms of depression- irritability, change in appetite, anhedonia, sadness/tearfullness.   The following portions of the patient's history were reviewed and updated as appropriate: allergies, current medications, past family history, past medical history,  past surgical history, past social history  and problem list.   Visual acuity was not assessed per patient preference since the patient has  regular follow up with an  ophthalmologist. Hearing and body mass index were assessed and reviewed.    During the course of the visit the patient was educated and counseled about appropriate screening and preventive services including : fall prevention , diabetes screening, nutrition counseling, colorectal cancer screening, and recommended immunizations.    Chief Complaint:  Anxeity:  seh has been under a lot of stress lately due to her mother's recent diagnosis of gastric CA and recurrent hospitalizations.  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  Her maternal aunt and uncle died  in 01/26/2024 and 03/27/2024 respectively, and her husband's family has been in conflict since his mother Amilee Janvier died 4 months ago.    Review of Symptoms  Patient denies headache, fevers, malaise, unintentional weight loss, skin rash, eye pain, sinus congestion and sinus pain, sore throat, dysphagia,  hemoptysis , cough, dyspnea, wheezing, chest pain, palpitations, orthopnea, edema, abdominal pain, nausea, melena, diarrhea, constipation, flank pain, dysuria, hematuria, urinary  Frequency, nocturia, numbness, tingling, seizures,  Focal weakness, Loss of consciousness,  Tremor, insomnia, depression, and suicidal ideation.    Physical Exam:  BP 116/80   Pulse 90   Ht 5' 9 (1.753 m)   Wt 188 lb 9.6 oz (85.5 kg)   LMP 05/01/2014 (Approximate)   SpO2 98%   BMI 27.85 kg/m    Physical  Exam Vitals reviewed.  Constitutional:      General: She is not in acute distress.    Appearance: Normal appearance. She is well-developed and normal weight. She is not ill-appearing, toxic-appearing or diaphoretic.  HENT:     Head: Normocephalic.     Right Ear: Tympanic membrane, ear canal and external ear normal. There is no impacted cerumen.     Left Ear: Tympanic membrane, ear canal and external ear normal. There is  no impacted cerumen.     Nose: Nose normal.     Mouth/Throat:     Mouth: Mucous membranes are moist.     Pharynx: Oropharynx is clear.  Eyes:     General: No scleral icterus.       Right eye: No discharge.        Left eye: No discharge.     Conjunctiva/sclera: Conjunctivae normal.     Pupils: Pupils are equal, round, and reactive to light.  Neck:     Thyroid : No thyromegaly.     Vascular: No carotid bruit or JVD.  Cardiovascular:     Rate and Rhythm: Normal rate and regular rhythm.     Heart sounds: Normal heart sounds.  Pulmonary:     Effort: Pulmonary effort is normal. No respiratory distress.     Breath sounds: Normal breath sounds.  Chest:  Breasts:    Breasts are symmetrical.     Right: Normal. No swelling, inverted nipple, mass, nipple discharge, skin change or tenderness.     Left: Normal. No swelling, inverted nipple, mass, nipple discharge, skin change or tenderness.  Abdominal:     General: Bowel sounds are normal.     Palpations: Abdomen is soft. There is no mass.     Tenderness: There is no abdominal tenderness. There is no guarding or rebound.     Hernia: There is no hernia in the left inguinal area or right inguinal area.  Genitourinary:    Exam position: Lithotomy position.     Pubic Area: No rash or pubic lice.      Labia:        Right: No rash, tenderness, lesion or injury.        Left: No rash, tenderness, lesion or injury.      Vagina: Normal.     Cervix: Normal.     Uterus: Normal.      Adnexa: Right adnexa normal and left adnexa normal.  Musculoskeletal:        General: Normal range of motion.     Cervical back: Normal range of motion and neck supple.  Lymphadenopathy:     Cervical: No cervical adenopathy.     Upper Body:     Right upper body: No supraclavicular, axillary or pectoral adenopathy.     Left upper body: No supraclavicular, axillary or pectoral adenopathy.     Lower Body: No right inguinal adenopathy. No left inguinal adenopathy.   Skin:    General: Skin is warm and dry.  Neurological:     General: No focal deficit present.     Mental Status: She is alert and oriented to person, place, and time. Mental status is at baseline.  Psychiatric:        Mood and Affect: Mood normal.        Behavior: Behavior normal.        Thought Content: Thought content normal.        Judgment: Judgment normal.     Assessment and Plan: Encounter for preventive health examination  Assessment & Plan: age appropriate education and counseling updated, referrals for preventative services and immunizations addressed, dietary and smoking counseling addressed, most recent labs reviewed.  I have personally reviewed and have noted:   1) the patient's medical and social history 2) The pt's use of alcohol, tobacco, and illicit drugs 3) The patient's current medications and supplements 4) Functional ability including ADL's, fall risk, home safety risk, hearing and visual impairment 5) Diet and physical activities 6) Evidence for depression or mood disorder 7) The patient's height, weight, and BMI have been recorded in the chart   I have made referrals, and provided counseling and education based on review of the above    Essential hypertension, benign Assessment & Plan: Well controlled on current regimen. Renal function stable, no changes today.   Lab Results  Component Value Date   CREATININE 0.80 04/30/2024   Lab Results  Component Value Date   NA 139 04/30/2024   K 4.4 04/30/2024   CL 102 04/30/2024   CO2 28 04/30/2024     Orders: -     Comprehensive metabolic panel with GFR -     Microalbumin / creatinine urine ratio  Hyperlipidemia LDL goal <130 Assessment & Plan: Patient's  10 yr risk using the AHA CRC is <10%  , however, there is  incidental evidence of atherosclerosis.  Advised to consider statin therapy  Lab Results  Component Value Date   CHOL 232 (H) 04/30/2024   HDL 58.00 04/30/2024   LDLCALC 151 (H)  04/30/2024   LDLDIRECT 167.0 04/30/2024   TRIG 113.0 04/30/2024   CHOLHDL 4 04/30/2024     Orders: -     Lipid panel -     LDL cholesterol, direct  Prediabetes Assessment & Plan: She has been able to lower her A1c with  A low  glycemic index diet , weight loss and  regular participation in an aerobic activity  Lab Results  Component Value Date   HGBA1C 5.9 04/30/2024     Orders: -     Hemoglobin A1c -     Comprehensive metabolic panel with GFR  Other fatigue -     CBC with Differential/Platelet -     TSH  Cervical cancer screening -     Cytology - PAP  Encounter for preventative adult health care examination  Hematochezia -     IBC + Ferritin  Thyromegaly -     US  THYROID ; Future  Need for influenza vaccination -     Flu vaccine trivalent PF, 6mos and older(Flulaval,Afluria,Fluarix,Fluzone)  Abdominal aortic atherosclerosis Assessment & Plan: Aortic arch atherosclerosis:  Reviewed findings of prior CT scan today and discussed the role of statin therapy in stablizing placque and preventing events.   She is contemplative    Chronic constipation Assessment & Plan: Managed with Linzess  prescribed by GI (Tendler)    Generalized anxiety disorder Assessment & Plan: Aggravated by family health issues and financial stressors.  Increasing paxil  CT dose to 37.5 mg and refilling alprazolam    History of small bowel obstruction Assessment & Plan: Secondary to adhesions, change in diet and history of multiple abdominal surgeries .  Resolved without intervention.  Continue aggressive management of constipation..    Other orders -     Amitriptyline  HCl; Take 1 tablet (100 mg total) by mouth at bedtime.  Dispense: 90 tablet; Refill: 1 -     Spironolactone ; Take 1 tablet (25 mg total) by mouth daily.  Dispense: 30 tablet; Refill: 2 -  PARoxetine  HCl ER; Take 1 tablet (37.5 mg total) by mouth daily.  Dispense: 30 tablet; Refill: 5 -     ALPRAZolam ; Take 1 tablet (0.5  mg total) by mouth daily as needed for anxiety (panic attacks).  Dispense: 30 tablet; Refill: 2    Return in about 3 months (around 07/31/2024) for anxiety.  Verneita LITTIE Kettering, MD

## 2024-05-01 ENCOUNTER — Ambulatory Visit: Payer: Self-pay | Admitting: Internal Medicine

## 2024-05-01 ENCOUNTER — Encounter: Payer: Self-pay | Admitting: Internal Medicine

## 2024-05-01 DIAGNOSIS — I700 Atherosclerosis of aorta: Secondary | ICD-10-CM | POA: Insufficient documentation

## 2024-05-01 NOTE — Assessment & Plan Note (Signed)
 Managed with Linzess  prescribed by GI Kriste)

## 2024-05-01 NOTE — Assessment & Plan Note (Signed)
 Aortic arch atherosclerosis:  Reviewed findings of prior CT scan today and discussed the role of statin therapy in stablizing placque and preventing events.   She is contemplative

## 2024-05-01 NOTE — Assessment & Plan Note (Signed)
 Aggravated by family health issues and financial stressors.  Increasing paxil  CT dose to 37.5 mg and refilling alprazolam 

## 2024-05-01 NOTE — Assessment & Plan Note (Signed)
 Secondary to adhesions, change in diet and history of multiple abdominal surgeries .  Resolved without intervention.  Continue aggressive management of constipation.SABRA

## 2024-05-01 NOTE — Assessment & Plan Note (Signed)
 Well controlled on current regimen. Renal function stable, no changes today.   Lab Results  Component Value Date   CREATININE 0.80 04/30/2024   Lab Results  Component Value Date   NA 139 04/30/2024   K 4.4 04/30/2024   CL 102 04/30/2024   CO2 28 04/30/2024

## 2024-05-01 NOTE — Assessment & Plan Note (Signed)
 She has been able to lower her A1c with  A low  glycemic index diet , weight loss and  regular participation in an aerobic activity  Lab Results  Component Value Date   HGBA1C 5.9 04/30/2024

## 2024-05-01 NOTE — Assessment & Plan Note (Signed)

## 2024-05-01 NOTE — Assessment & Plan Note (Addendum)
 Patient's  10 yr risk using the AHA CRC is <10%  , however, there is  incidental evidence of atherosclerosis.  Advised to consider statin therapy  Lab Results  Component Value Date   CHOL 232 (H) 04/30/2024   HDL 58.00 04/30/2024   LDLCALC 151 (H) 04/30/2024   LDLDIRECT 167.0 04/30/2024   TRIG 113.0 04/30/2024   CHOLHDL 4 04/30/2024

## 2024-05-04 LAB — CYTOLOGY - PAP: Diagnosis: NEGATIVE

## 2024-05-05 ENCOUNTER — Other Ambulatory Visit: Payer: Self-pay | Admitting: Internal Medicine

## 2024-05-05 DIAGNOSIS — E782 Mixed hyperlipidemia: Secondary | ICD-10-CM

## 2024-05-05 MED ORDER — ROSUVASTATIN CALCIUM 10 MG PO TABS: 10.0000 mg | ORAL_TABLET | Freq: Every day | ORAL | 3 refills | Status: AC

## 2024-05-05 NOTE — Telephone Encounter (Signed)
 Noted. Patient is scheduled

## 2024-05-07 ENCOUNTER — Ambulatory Visit: Admission: RE | Admit: 2024-05-07 | Discharge: 2024-05-07 | Attending: Internal Medicine | Admitting: Internal Medicine

## 2024-05-07 DIAGNOSIS — E010 Iodine-deficiency related diffuse (endemic) goiter: Secondary | ICD-10-CM | POA: Insufficient documentation

## 2024-05-07 DIAGNOSIS — E0789 Other specified disorders of thyroid: Secondary | ICD-10-CM | POA: Diagnosis not present

## 2024-05-14 ENCOUNTER — Other Ambulatory Visit: Payer: Self-pay | Admitting: Internal Medicine

## 2024-05-14 DIAGNOSIS — K2950 Unspecified chronic gastritis without bleeding: Secondary | ICD-10-CM | POA: Diagnosis not present

## 2024-05-14 DIAGNOSIS — K5904 Chronic idiopathic constipation: Secondary | ICD-10-CM | POA: Diagnosis not present

## 2024-05-14 DIAGNOSIS — Z8719 Personal history of other diseases of the digestive system: Secondary | ICD-10-CM | POA: Diagnosis not present

## 2024-05-14 DIAGNOSIS — K449 Diaphragmatic hernia without obstruction or gangrene: Secondary | ICD-10-CM | POA: Diagnosis not present

## 2024-05-14 DIAGNOSIS — Z800 Family history of malignant neoplasm of digestive organs: Secondary | ICD-10-CM | POA: Diagnosis not present

## 2024-05-14 DIAGNOSIS — K921 Melena: Secondary | ICD-10-CM | POA: Diagnosis not present

## 2024-05-15 ENCOUNTER — Inpatient Hospital Stay: Admission: RE | Admit: 2024-05-15 | Discharge: 2024-05-15 | Attending: Internal Medicine | Admitting: Internal Medicine

## 2024-05-15 DIAGNOSIS — Z1231 Encounter for screening mammogram for malignant neoplasm of breast: Secondary | ICD-10-CM | POA: Diagnosis present

## 2024-05-16 ENCOUNTER — Other Ambulatory Visit: Payer: Self-pay | Admitting: Internal Medicine

## 2024-05-16 NOTE — Telephone Encounter (Signed)
 Refill request for Ambien  12.5mg  30tab with 5refills; last fill begin date 10/25/23; last OV 04/30/24 for CPE; next OV 07/31/24.

## 2024-06-02 ENCOUNTER — Other Ambulatory Visit (INDEPENDENT_AMBULATORY_CARE_PROVIDER_SITE_OTHER)

## 2024-06-02 DIAGNOSIS — E782 Mixed hyperlipidemia: Secondary | ICD-10-CM | POA: Diagnosis not present

## 2024-06-02 LAB — COMPREHENSIVE METABOLIC PANEL WITH GFR
ALT: 19 U/L (ref 3–35)
AST: 20 U/L (ref 5–37)
Albumin: 4.4 g/dL (ref 3.5–5.2)
Alkaline Phosphatase: 83 U/L (ref 39–117)
BUN: 10 mg/dL (ref 6–23)
CO2: 27 meq/L (ref 19–32)
Calcium: 9.6 mg/dL (ref 8.4–10.5)
Chloride: 103 meq/L (ref 96–112)
Creatinine, Ser: 0.82 mg/dL (ref 0.40–1.20)
GFR: 77.69 mL/min
Glucose, Bld: 112 mg/dL — ABNORMAL HIGH (ref 70–99)
Potassium: 4.7 meq/L (ref 3.5–5.1)
Sodium: 139 meq/L (ref 135–145)
Total Bilirubin: 0.3 mg/dL (ref 0.2–1.2)
Total Protein: 6.9 g/dL (ref 6.0–8.3)

## 2024-06-03 ENCOUNTER — Ambulatory Visit: Payer: Self-pay | Admitting: Internal Medicine

## 2024-06-22 ENCOUNTER — Other Ambulatory Visit: Payer: Self-pay | Admitting: Internal Medicine

## 2024-07-09 ENCOUNTER — Other Ambulatory Visit (HOSPITAL_COMMUNITY): Payer: Self-pay

## 2024-07-09 ENCOUNTER — Telehealth: Payer: Self-pay

## 2024-07-09 NOTE — Telephone Encounter (Signed)
 Pharmacy Patient Advocate Encounter   Received notification from [facility] that prior authorization for PARoxetine  HCl ER 37.5MG  er tablets  is required/requested.   Insurance verification completed.   The patient is insured through [facility].   Per test claim: Per test claim, medication is not covered due to plan/benefit exclusion, PA not submitted at this time

## 2024-07-31 ENCOUNTER — Ambulatory Visit: Admitting: Internal Medicine
